# Patient Record
Sex: Female | Born: 1985 | Race: Black or African American | Hispanic: No | Marital: Single | Smoking: Current every day smoker
Health system: Southern US, Community
[De-identification: ages and names within clinical notes are randomized; demographics above are authoritative.]

## PROBLEM LIST (undated history)

## (undated) ENCOUNTER — Inpatient Hospital Stay (HOSPITAL_COMMUNITY): Payer: Self-pay

## (undated) DIAGNOSIS — IMO0002 Reserved for concepts with insufficient information to code with codable children: Secondary | ICD-10-CM

## (undated) DIAGNOSIS — R51 Headache: Secondary | ICD-10-CM

## (undated) DIAGNOSIS — R519 Headache, unspecified: Secondary | ICD-10-CM

## (undated) DIAGNOSIS — G4730 Sleep apnea, unspecified: Secondary | ICD-10-CM

## (undated) DIAGNOSIS — G5600 Carpal tunnel syndrome, unspecified upper limb: Secondary | ICD-10-CM

## (undated) DIAGNOSIS — N970 Female infertility associated with anovulation: Secondary | ICD-10-CM

## (undated) DIAGNOSIS — E119 Type 2 diabetes mellitus without complications: Secondary | ICD-10-CM

## (undated) DIAGNOSIS — O1490 Unspecified pre-eclampsia, unspecified trimester: Secondary | ICD-10-CM

## (undated) HISTORY — DX: Carpal tunnel syndrome, unspecified upper limb: G56.00

## (undated) HISTORY — DX: Unspecified pre-eclampsia, unspecified trimester: O14.90

## (undated) HISTORY — PX: HERNIA REPAIR: SHX51

## (undated) HISTORY — DX: Female infertility associated with anovulation: N97.0

## (undated) HISTORY — DX: Reserved for concepts with insufficient information to code with codable children: IMO0002

---
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
## 2007-02-28 ENCOUNTER — Encounter: Admission: RE | Admit: 2007-02-28 | Discharge: 2007-02-28 | Payer: Self-pay | Admitting: Internal Medicine

## 2007-03-24 HISTORY — PX: BREAST SURGERY: SHX581

## 2007-04-07 ENCOUNTER — Encounter: Admission: RE | Admit: 2007-04-07 | Discharge: 2007-04-07 | Payer: Self-pay | Admitting: Internal Medicine

## 2007-04-15 ENCOUNTER — Encounter (INDEPENDENT_AMBULATORY_CARE_PROVIDER_SITE_OTHER): Payer: Self-pay | Admitting: Diagnostic Radiology

## 2007-04-15 ENCOUNTER — Encounter: Admission: RE | Admit: 2007-04-15 | Discharge: 2007-04-15 | Payer: Self-pay | Admitting: Internal Medicine

## 2007-04-22 ENCOUNTER — Encounter (INDEPENDENT_AMBULATORY_CARE_PROVIDER_SITE_OTHER): Payer: Self-pay | Admitting: Diagnostic Radiology

## 2007-04-22 ENCOUNTER — Encounter: Admission: RE | Admit: 2007-04-22 | Discharge: 2007-04-22 | Payer: Self-pay | Admitting: Internal Medicine

## 2007-04-24 HISTORY — PX: BREAST BIOPSY: SHX20

## 2008-06-21 ENCOUNTER — Emergency Department (HOSPITAL_COMMUNITY): Admission: EM | Admit: 2008-06-21 | Discharge: 2008-06-21 | Payer: Self-pay | Admitting: Emergency Medicine

## 2008-10-02 ENCOUNTER — Emergency Department (HOSPITAL_COMMUNITY): Admission: EM | Admit: 2008-10-02 | Discharge: 2008-10-02 | Payer: Self-pay | Admitting: Emergency Medicine

## 2009-04-14 ENCOUNTER — Emergency Department (HOSPITAL_COMMUNITY): Admission: EM | Admit: 2009-04-14 | Discharge: 2009-04-14 | Payer: Self-pay | Admitting: Emergency Medicine

## 2009-08-06 ENCOUNTER — Emergency Department (HOSPITAL_COMMUNITY): Admission: EM | Admit: 2009-08-06 | Discharge: 2009-08-06 | Payer: Self-pay | Admitting: Emergency Medicine

## 2009-12-17 ENCOUNTER — Emergency Department (HOSPITAL_COMMUNITY): Admission: EM | Admit: 2009-12-17 | Discharge: 2009-12-17 | Payer: Self-pay | Admitting: Emergency Medicine

## 2010-04-11 ENCOUNTER — Ambulatory Visit: Admit: 2010-04-11 | Payer: Self-pay | Admitting: Gynecology

## 2010-04-12 ENCOUNTER — Encounter: Payer: Self-pay | Admitting: Internal Medicine

## 2010-04-13 ENCOUNTER — Encounter: Payer: Self-pay | Admitting: Internal Medicine

## 2010-06-05 LAB — BASIC METABOLIC PANEL
BUN: 7 mg/dL (ref 6–23)
CO2: 23 mEq/L (ref 19–32)
Chloride: 107 mEq/L (ref 96–112)
Creatinine, Ser: 0.95 mg/dL (ref 0.4–1.2)
Glucose, Bld: 94 mg/dL (ref 70–99)
Potassium: 3.3 mEq/L — ABNORMAL LOW (ref 3.5–5.1)

## 2010-06-05 LAB — CBC
HCT: 38.1 % (ref 36.0–46.0)
MCH: 27.6 pg (ref 26.0–34.0)
MCHC: 34.4 g/dL (ref 30.0–36.0)
MCV: 80.4 fL (ref 78.0–100.0)
RDW: 12.2 % (ref 11.5–15.5)

## 2010-06-08 LAB — COMPREHENSIVE METABOLIC PANEL
ALT: 46 U/L — ABNORMAL HIGH (ref 0–35)
Alkaline Phosphatase: 43 U/L (ref 39–117)
BUN: 9 mg/dL (ref 6–23)
CO2: 25 mEq/L (ref 19–32)
Calcium: 9.5 mg/dL (ref 8.4–10.5)
GFR calc non Af Amer: >60 mL/min (ref >60)
Glucose, Bld: 94 mg/dL (ref 70–99)
Potassium: 3.9 mEq/L (ref 3.5–5.1)
Sodium: 136 mEq/L (ref 135–145)

## 2010-06-08 LAB — CBC
HCT: 39.8 % (ref 36.0–46.0)
Hemoglobin: 13.6 g/dL (ref 12.0–15.0)
MCHC: 34.3 g/dL (ref 30.0–36.0)
RBC: 4.75 MIL/uL (ref 3.87–5.11)

## 2010-06-08 LAB — URINALYSIS, ROUTINE W REFLEX MICROSCOPIC
Bilirubin Urine: NEGATIVE
Glucose, UA: NEGATIVE mg/dL
Hgb urine dipstick: NEGATIVE
Nitrite: NEGATIVE
Specific Gravity, Urine: 1.023 (ref 1.005–1.030)
pH: 7.5 (ref 5.0–8.0)

## 2010-06-08 LAB — DIFFERENTIAL
Basophils Relative: 0 % (ref 0–1)
Eosinophils Absolute: 0.1 10*3/uL (ref 0.0–0.7)
Neutrophils Relative %: 79 % — ABNORMAL HIGH (ref 43–77)

## 2010-06-08 LAB — POCT I-STAT, CHEM 8
HCT: 42 % (ref 36.0–46.0)
Hemoglobin: 14.3 g/dL (ref 12.0–15.0)
Sodium: 138 mEq/L (ref 135–145)
TCO2: 27 mmol/L (ref 0–100)

## 2010-06-08 LAB — LIPASE, BLOOD: Lipase: 30 U/L (ref 11–59)

## 2010-06-08 LAB — POCT PREGNANCY, URINE: Preg Test, Ur: NEGATIVE

## 2010-06-09 LAB — RAPID STREP SCREEN (MED CTR MEBANE ONLY): Streptococcus, Group A Screen (Direct): NEGATIVE

## 2010-07-02 LAB — WET PREP, GENITAL

## 2010-07-02 LAB — URINALYSIS, ROUTINE W REFLEX MICROSCOPIC
Bilirubin Urine: NEGATIVE
Ketones, ur: NEGATIVE mg/dL
Nitrite: NEGATIVE
Urobilinogen, UA: 1 mg/dL (ref 0.0–1.0)

## 2010-07-02 LAB — GC/CHLAMYDIA PROBE AMP, GENITAL: Chlamydia, DNA Probe: POSITIVE — AB

## 2010-07-02 LAB — POCT PREGNANCY, URINE: Preg Test, Ur: NEGATIVE

## 2010-08-19 ENCOUNTER — Ambulatory Visit (INDEPENDENT_AMBULATORY_CARE_PROVIDER_SITE_OTHER): Payer: Self-pay | Admitting: Gynecology

## 2010-08-19 DIAGNOSIS — N926 Irregular menstruation, unspecified: Secondary | ICD-10-CM

## 2010-08-19 DIAGNOSIS — L680 Hirsutism: Secondary | ICD-10-CM

## 2010-08-21 ENCOUNTER — Other Ambulatory Visit: Payer: Self-pay | Admitting: Gynecology

## 2010-08-21 DIAGNOSIS — Z1231 Encounter for screening mammogram for malignant neoplasm of breast: Secondary | ICD-10-CM

## 2010-08-25 ENCOUNTER — Other Ambulatory Visit: Payer: Managed Care, Other (non HMO)

## 2010-08-25 ENCOUNTER — Ambulatory Visit (INDEPENDENT_AMBULATORY_CARE_PROVIDER_SITE_OTHER): Payer: Managed Care, Other (non HMO) | Admitting: Gynecology

## 2010-08-25 ENCOUNTER — Other Ambulatory Visit: Payer: Self-pay | Admitting: Gynecology

## 2010-08-25 DIAGNOSIS — E282 Polycystic ovarian syndrome: Secondary | ICD-10-CM

## 2010-08-25 DIAGNOSIS — N912 Amenorrhea, unspecified: Secondary | ICD-10-CM

## 2010-08-25 DIAGNOSIS — N926 Irregular menstruation, unspecified: Secondary | ICD-10-CM

## 2010-08-27 ENCOUNTER — Ambulatory Visit
Admission: RE | Admit: 2010-08-27 | Discharge: 2010-08-27 | Disposition: A | Discharge status: Home / Self Care | Payer: Managed Care, Other (non HMO) | Source: Ambulatory Visit | Attending: Gynecology | Admitting: Gynecology

## 2010-08-27 DIAGNOSIS — Z1231 Encounter for screening mammogram for malignant neoplasm of breast: Secondary | ICD-10-CM

## 2010-08-29 ENCOUNTER — Other Ambulatory Visit: Payer: Self-pay | Admitting: Gynecology

## 2010-08-29 DIAGNOSIS — R928 Other abnormal and inconclusive findings on diagnostic imaging of breast: Secondary | ICD-10-CM

## 2010-09-09 ENCOUNTER — Ambulatory Visit
Admission: RE | Admit: 2010-09-09 | Discharge: 2010-09-09 | Disposition: A | Discharge status: Home / Self Care | Payer: Managed Care, Other (non HMO) | Source: Ambulatory Visit | Attending: Gynecology | Admitting: Gynecology

## 2010-09-09 DIAGNOSIS — R928 Other abnormal and inconclusive findings on diagnostic imaging of breast: Secondary | ICD-10-CM

## 2010-09-17 ENCOUNTER — Emergency Department (HOSPITAL_COMMUNITY): Payer: Managed Care, Other (non HMO)

## 2010-09-17 ENCOUNTER — Emergency Department (HOSPITAL_COMMUNITY)
Admission: EM | Admit: 2010-09-17 | Discharge: 2010-09-17 | Disposition: A | Discharge status: Home / Self Care | Payer: Managed Care, Other (non HMO) | Attending: Emergency Medicine | Admitting: Emergency Medicine

## 2010-09-17 DIAGNOSIS — R197 Diarrhea, unspecified: Secondary | ICD-10-CM | POA: Insufficient documentation

## 2010-09-17 DIAGNOSIS — N949 Unspecified condition associated with female genital organs and menstrual cycle: Secondary | ICD-10-CM | POA: Insufficient documentation

## 2010-09-17 DIAGNOSIS — B9689 Other specified bacterial agents as the cause of diseases classified elsewhere: Secondary | ICD-10-CM | POA: Insufficient documentation

## 2010-09-17 DIAGNOSIS — N760 Acute vaginitis: Secondary | ICD-10-CM | POA: Insufficient documentation

## 2010-09-17 DIAGNOSIS — A499 Bacterial infection, unspecified: Secondary | ICD-10-CM | POA: Insufficient documentation

## 2010-09-17 LAB — URINALYSIS, ROUTINE W REFLEX MICROSCOPIC
Bilirubin Urine: NEGATIVE
Nitrite: NEGATIVE
Specific Gravity, Urine: 1.029 (ref 1.005–1.030)
Urobilinogen, UA: 0.2 mg/dL (ref 0.0–1.0)
pH: 5 (ref 5.0–8.0)

## 2010-09-17 LAB — WET PREP, GENITAL: WBC, Wet Prep HPF POC: NONE SEEN

## 2010-09-17 LAB — POCT PREGNANCY, URINE: Preg Test, Ur: NEGATIVE

## 2010-09-18 LAB — GC/CHLAMYDIA PROBE AMP, GENITAL
Chlamydia, DNA Probe: NEGATIVE
GC Probe Amp, Genital: NEGATIVE

## 2011-03-06 ENCOUNTER — Other Ambulatory Visit: Payer: Managed Care, Other (non HMO)

## 2011-03-24 DIAGNOSIS — G5600 Carpal tunnel syndrome, unspecified upper limb: Secondary | ICD-10-CM

## 2011-03-24 HISTORY — DX: Carpal tunnel syndrome, unspecified upper limb: G56.00

## 2011-04-14 ENCOUNTER — Encounter: Payer: Self-pay | Admitting: Gynecology

## 2011-04-14 ENCOUNTER — Ambulatory Visit (INDEPENDENT_AMBULATORY_CARE_PROVIDER_SITE_OTHER): Payer: Self-pay | Admitting: Gynecology

## 2011-04-14 DIAGNOSIS — N920 Excessive and frequent menstruation with regular cycle: Secondary | ICD-10-CM

## 2011-04-14 DIAGNOSIS — N946 Dysmenorrhea, unspecified: Secondary | ICD-10-CM

## 2011-04-14 DIAGNOSIS — N949 Unspecified condition associated with female genital organs and menstrual cycle: Secondary | ICD-10-CM

## 2011-04-14 DIAGNOSIS — N938 Other specified abnormal uterine and vaginal bleeding: Secondary | ICD-10-CM

## 2011-04-14 MED ORDER — IBUPROFEN 800 MG PO TABS: 800.0000 mg | ORAL_TABLET | Freq: Three times a day (TID) | ORAL | Status: AC | PRN

## 2011-04-14 NOTE — Progress Notes (Signed)
Patient presents complaining of heavy bleeding. She was seen earlier this past year with a history of irregular menses and an oligo-ovulatory pattern.  She had a normal prolactin, TSH, negative hCG glucose of 76 and a lipid profile showed a borderline lipid profile. 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 She was started on oral contraceptives and took one pack of Loestrin 120 equivalent stop them and then has had regular menses on a monthly basis up until her normal last menstrual period the end of December. She then began bleeding January 3 and has bled on and off since then. She reports her last intercourse was prior to her last normal period in December. She is having a lot of cramping with this bleeding. No fevers chills nausea vomiting constipation diarrhea or urinary symptoms.  Exam with Sherri chaperone present Abdomen soft mild lower, will tenderness no rebound guarding masses organomegaly Pelvic external BUS vagina with light menses flow. Cervix normal. Uterus normal size midline mobile nontender. Adnexa without masses or tenderness.  Assessment and plan: DUB history of PCO type pattern although has been having regular menses over the past 6 months. Also complaining of wanting to achieve pregnancy without success. Will check baseline CBC and hCG. Assuming hCG negative then will withdraw on progesterone. She'll keep menstrual calendar if regular menses then will pursue an infertility evaluation to begin with semen analysis and HSG noting normal hormone studies earlier this year. If irregular menses I discussed possible Clomid treatment. I reviewed the risks of ovulatory stimulation to include multiple gestation, hyperstimulation syndrome and possible ovarian cancer linkage.  She will call tomorrow once we get the hCG results back and then we'll withdraw on progesterone.

## 2011-04-14 NOTE — Patient Instructions (Signed)
Follow up tomorrow for blood results.

## 2011-04-15 ENCOUNTER — Ambulatory Visit: Payer: Managed Care, Other (non HMO) | Admitting: Gynecology

## 2011-04-20 ENCOUNTER — Other Ambulatory Visit: Payer: Self-pay | Admitting: Gynecology

## 2011-04-20 ENCOUNTER — Telehealth: Payer: Self-pay | Admitting: *Deleted

## 2011-04-20 NOTE — Telephone Encounter (Signed)
Patient informed labs normal

## 2011-06-22 ENCOUNTER — Ambulatory Visit: Payer: Self-pay | Admitting: Gynecology

## 2012-01-31 ENCOUNTER — Emergency Department (HOSPITAL_COMMUNITY): Payer: Self-pay

## 2012-01-31 ENCOUNTER — Encounter (HOSPITAL_COMMUNITY): Payer: Self-pay | Admitting: Emergency Medicine

## 2012-01-31 ENCOUNTER — Emergency Department (HOSPITAL_COMMUNITY)
Admission: EM | Admit: 2012-01-31 | Discharge: 2012-02-01 | Disposition: A | Discharge status: Home / Self Care | Payer: Self-pay | Attending: Emergency Medicine | Admitting: Emergency Medicine

## 2012-01-31 DIAGNOSIS — R0789 Other chest pain: Secondary | ICD-10-CM | POA: Insufficient documentation

## 2012-01-31 DIAGNOSIS — J029 Acute pharyngitis, unspecified: Secondary | ICD-10-CM | POA: Insufficient documentation

## 2012-01-31 DIAGNOSIS — G43909 Migraine, unspecified, not intractable, without status migrainosus: Secondary | ICD-10-CM | POA: Insufficient documentation

## 2012-01-31 DIAGNOSIS — G5600 Carpal tunnel syndrome, unspecified upper limb: Secondary | ICD-10-CM | POA: Insufficient documentation

## 2012-01-31 DIAGNOSIS — R059 Cough, unspecified: Secondary | ICD-10-CM | POA: Insufficient documentation

## 2012-01-31 DIAGNOSIS — R05 Cough: Secondary | ICD-10-CM | POA: Insufficient documentation

## 2012-01-31 DIAGNOSIS — R0982 Postnasal drip: Secondary | ICD-10-CM | POA: Insufficient documentation

## 2012-01-31 DIAGNOSIS — B9789 Other viral agents as the cause of diseases classified elsewhere: Secondary | ICD-10-CM | POA: Insufficient documentation

## 2012-01-31 DIAGNOSIS — J069 Acute upper respiratory infection, unspecified: Secondary | ICD-10-CM | POA: Insufficient documentation

## 2012-01-31 DIAGNOSIS — J3489 Other specified disorders of nose and nasal sinuses: Secondary | ICD-10-CM | POA: Insufficient documentation

## 2012-01-31 DIAGNOSIS — R0602 Shortness of breath: Secondary | ICD-10-CM | POA: Insufficient documentation

## 2012-01-31 NOTE — ED Notes (Addendum)
Patient complaining of sudden onset of shortness of breath, chest pain, and dizziness that started today.  Reports chest pain mid-sternal; denies radiation of pain.  Patient short of breath during assessment; needs to pause in-between words.  Denies nausea and vomiting.  Reports cough and nasal that started two days ago.  Patient tearful in triage.

## 2012-02-01 ENCOUNTER — Emergency Department (HOSPITAL_COMMUNITY): Payer: Self-pay

## 2012-02-01 LAB — CBC WITH DIFFERENTIAL/PLATELET
Basophils Absolute: 0 10*3/uL (ref 0.0–0.1)
Basophils Relative: 0 % (ref 0–1)
HCT: 39.6 % (ref 36.0–46.0)
Lymphocytes Relative: 19 % (ref 12–46)
MCHC: 34.8 g/dL (ref 30.0–36.0)
Neutro Abs: 8.8 10*3/uL — ABNORMAL HIGH (ref 1.7–7.7)
Neutrophils Relative %: 71 % (ref 43–77)
Platelets: 315 10*3/uL (ref 150–400)
RDW: 12.3 % (ref 11.5–15.5)
WBC: 12.3 10*3/uL — ABNORMAL HIGH (ref 4.0–10.5)

## 2012-02-01 LAB — URINALYSIS, ROUTINE W REFLEX MICROSCOPIC
Bilirubin Urine: NEGATIVE
Glucose, UA: NEGATIVE mg/dL
Hgb urine dipstick: NEGATIVE
Protein, ur: NEGATIVE mg/dL

## 2012-02-01 LAB — POCT I-STAT TROPONIN I: Troponin i, poc: 0 ng/mL (ref 0.00–0.08)

## 2012-02-01 LAB — COMPREHENSIVE METABOLIC PANEL
ALT: 32 U/L (ref 0–35)
AST: 24 U/L (ref 0–37)
Albumin: 3.7 g/dL (ref 3.5–5.2)
CO2: 24 mEq/L (ref 19–32)
Chloride: 100 mEq/L (ref 96–112)
Creatinine, Ser: 0.74 mg/dL (ref 0.50–1.10)
GFR calc non Af Amer: >90 mL/min (ref >90)
Potassium: 3.7 mEq/L (ref 3.5–5.1)
Sodium: 134 mEq/L — ABNORMAL LOW (ref 135–145)
Total Bilirubin: 0.4 mg/dL (ref 0.3–1.2)

## 2012-02-01 MED ORDER — DIPHENHYDRAMINE HCL 25 MG PO CAPS
25.0000 mg | ORAL_CAPSULE | Freq: Once | ORAL | Status: AC
  Administered 2012-02-01: 25 mg via ORAL
  Filled 2012-02-01: qty 1

## 2012-02-01 MED ORDER — OXYCODONE-ACETAMINOPHEN 5-325 MG PO TABS
1.0000 | ORAL_TABLET | Freq: Once | ORAL | Status: AC
  Administered 2012-02-01: 1 via ORAL
  Filled 2012-02-01: qty 1

## 2012-02-01 MED ORDER — IOHEXOL 350 MG/ML SOLN
100.0000 mL | Freq: Once | INTRAVENOUS | Status: AC | PRN
  Administered 2012-02-01: 100 mL via INTRAVENOUS

## 2012-02-01 MED ORDER — IBUPROFEN 800 MG PO TABS
800.0000 mg | ORAL_TABLET | Freq: Once | ORAL | Status: AC
  Administered 2012-02-01: 800 mg via ORAL
  Filled 2012-02-01: qty 1

## 2012-02-01 MED ORDER — SODIUM CHLORIDE 0.9 % IV BOLUS (SEPSIS)
1000.0000 mL | Freq: Once | INTRAVENOUS | Status: AC
  Administered 2012-02-01: 1000 mL via INTRAVENOUS

## 2012-02-01 NOTE — ED Provider Notes (Signed)
Medical screening examination/treatment/procedure(s) were conducted as a shared visit with non-physician practitioner(s) and myself.  I personally evaluated the patient during the encounter.  Pt with sob, cp, tachycardia.  CTA chest negative for PE, suspect viral infection  Olivia Mackie, MD 02/01/12 1751

## 2012-02-01 NOTE — ED Notes (Signed)
Pt. Reports sore throat x2 days. States woke up this evening with chest pain and HA and dizziness.

## 2012-02-01 NOTE — ED Provider Notes (Signed)
History     CSN: [id number]  Arrival date & time 01/31/12  2109   First MD Initiated Contact with Patient 01/31/12 2359      Chief Complaint  Patient presents with  . Chest Pain  . Dizziness    (Consider location/radiation/quality/duration/timing/severity/associated sxs/prior treatment) Patient is a 26 y.o. female presenting with chest pain. The history is provided by the patient. No language interpreter was used.  Chest Pain The chest pain began yesterday. Chest pain occurs constantly. The chest pain is unchanged. The pain is associated with breathing and coughing. At its most intense, the pain is at 8/10. The severity of the pain is moderate. The quality of the pain is described as pressure-like and heavy. The pain does not radiate. Chest pain is worsened by deep breathing. Primary symptoms include shortness of breath and cough. Pertinent negatives for primary symptoms include no fever, no wheezing, no palpitations, no nausea and no vomiting.   Midsternal nonradiating chest Pain that is worse when she coughs since yesterday when she woke with pain around 7pm.  States that the pain is constant 7/10 and worse with a deep breath. + SOB and dizziness.   Also c/o a bilateral headache that started at the same time.  Feels like a migraine ha that I used to get a long time a go.   Took childrens motrin for pain with no relief.  Headache is 10/10 with photophobia.   C/o Runny nose nasal congestion, , sneezing, scratchy throat.  Denies n/v, ear pain. No birth control pills but tachycardia today.  pmh listed below.  Smoker. Plan to check labs, chest x-ray and CT angio to r/o pe.  Past Medical History  Diagnosis Date  . Carpal tunnel syndrome 03-2011    BILATERAL RECEIVES INJECTIONS    Past Surgical History  Procedure Date  . Breast surgery 2009    left breast bx-benign    Family History  Problem Relation Age of Onset  . Breast cancer Mother     2005 AND 2013  . Diabetes Father   .  Diabetes Maternal Grandfather   . Breast cancer Paternal Grandmother   . Breast cancer Paternal Grandfather     History  Substance Use Topics  . Smoking status: Current Every Day Smoker    Types: Cigarettes  . Smokeless tobacco: Former Neurosurgeon  . Alcohol Use: No    OB History    Grav Para Term Preterm Abortions TAB SAB Ect Mult Living   2    2  2          Review of Systems  Constitutional: Negative.  Negative for fever.  HENT: Positive for congestion, sore throat, rhinorrhea, sneezing, postnasal drip and sinus pressure. Negative for ear pain and trouble swallowing.   Eyes: Negative.   Respiratory: Positive for cough and shortness of breath. Negative for wheezing.   Cardiovascular: Positive for chest pain. Negative for palpitations.  Gastrointestinal: Negative.  Negative for nausea and vomiting.  Neurological: Negative.   Psychiatric/Behavioral: Negative.   All other systems reviewed and are negative.    Allergies  Review of patient's allergies indicates no known allergies.  Home Medications  No current outpatient prescriptions on file.  BP 149/93  Pulse 113  Temp 98.4 F (36.9 C) (Oral)  Resp 18  SpO2 98%  Physical Exam  Nursing note and vitals reviewed. Constitutional: She is oriented to person, place, and time. She appears well-developed and well-nourished.  HENT:  Head: Normocephalic and atraumatic.  Right Ear: Tympanic  membrane normal.  Left Ear: Tympanic membrane normal.  Nose: Mucosal edema and rhinorrhea present.  Mouth/Throat: Uvula is midline and mucous membranes are normal. Posterior oropharyngeal erythema present. No oropharyngeal exudate or posterior oropharyngeal edema.  Eyes: Conjunctivae normal and EOM are normal. Pupils are equal, round, and reactive to light.  Neck: Normal range of motion. Neck supple.  Cardiovascular: Normal rate.   Pulmonary/Chest: Effort normal. No respiratory distress. She has no wheezes. She exhibits no tenderness.    Abdominal: Soft. She exhibits no distension. There is no tenderness.  Musculoskeletal: Normal range of motion. She exhibits no edema and no tenderness.  Neurological: She is alert and oriented to person, place, and time. She has normal strength and normal reflexes. No cranial nerve deficit or sensory deficit. GCS eye subscore is 4. GCS verbal subscore is 5. GCS motor subscore is 6.       PEARL no drift or droop.  Neuro in tact.  MAE =  coordination and DTR  Skin: Skin is warm and dry.  Psychiatric: She has a normal mood and affect.    ED Course  Procedures (including critical care time)  Labs Reviewed  CBC WITH DIFFERENTIAL - Abnormal; Notable for the following:    WBC 12.3 (*)     Neutro Abs 8.8 (*)     All other components within normal limits  COMPREHENSIVE METABOLIC PANEL   Dg Chest 2 View  01/31/2012  *RADIOLOGY REPORT*  Clinical Data: Chest pain and dizziness  CHEST - 2 VIEW  Comparison: 08/06/2009  Findings: The heart size and mediastinal contours are within normal limits.  Both lungs are clear.  The visualized skeletal structures are unremarkable.  IMPRESSION: Negative exam.   Original Report Authenticated By: Signa Kell, M.D.      No diagnosis found.    MDM  26 yo female with midsternal cp, sob tachycardia and migraine h/a. Chest x-ray and labs unremarkable. CT of the chest reviewed by Dr Norlene Campbell shows no PE. Upper respiratory infection as well.   IVF bolus ibuprofen/percocet and benadryl in the ER. Will follow up with pcp of choice or return to ER for severe pain, n/v or high fever.   Labs Reviewed  CBC WITH DIFFERENTIAL - Abnormal; Notable for the following:    WBC 12.3 (*)     Neutro Abs 8.8 (*)     All other components within normal limits  COMPREHENSIVE METABOLIC PANEL - Abnormal; Notable for the following:    Sodium 134 (*)     Glucose, Bld 137 (*)     All other components within normal limits  POCT I-STAT TROPONIN I  URINALYSIS, ROUTINE W REFLEX MICROSCOPIC      Date: 02/01/2012  Rate: 89  Rhythm: normal sinus rhythm  QRS Axis: normal  Intervals: normal  ST/T Wave abnormalities: normal  Conduction Disutrbances:none  Narrative Interpretation:   Old EKG Reviewed: none available          Remi Haggard, NP 02/01/12 1239

## 2012-04-14 ENCOUNTER — Emergency Department (HOSPITAL_COMMUNITY)
Admission: EM | Admit: 2012-04-14 | Discharge: 2012-04-14 | Disposition: A | Discharge status: Home / Self Care | Payer: No Typology Code available for payment source | Attending: Emergency Medicine | Admitting: Emergency Medicine

## 2012-04-14 ENCOUNTER — Encounter (HOSPITAL_COMMUNITY): Payer: Self-pay | Admitting: Emergency Medicine

## 2012-04-14 ENCOUNTER — Emergency Department (HOSPITAL_COMMUNITY): Payer: No Typology Code available for payment source

## 2012-04-14 DIAGNOSIS — F17200 Nicotine dependence, unspecified, uncomplicated: Secondary | ICD-10-CM | POA: Insufficient documentation

## 2012-04-14 DIAGNOSIS — S161XXA Strain of muscle, fascia and tendon at neck level, initial encounter: Secondary | ICD-10-CM

## 2012-04-14 DIAGNOSIS — S139XXA Sprain of joints and ligaments of unspecified parts of neck, initial encounter: Secondary | ICD-10-CM | POA: Insufficient documentation

## 2012-04-14 DIAGNOSIS — Y92410 Unspecified street and highway as the place of occurrence of the external cause: Secondary | ICD-10-CM | POA: Insufficient documentation

## 2012-04-14 DIAGNOSIS — Y9389 Activity, other specified: Secondary | ICD-10-CM | POA: Insufficient documentation

## 2012-04-14 DIAGNOSIS — IMO0002 Reserved for concepts with insufficient information to code with codable children: Secondary | ICD-10-CM | POA: Insufficient documentation

## 2012-04-14 DIAGNOSIS — Z8739 Personal history of other diseases of the musculoskeletal system and connective tissue: Secondary | ICD-10-CM | POA: Insufficient documentation

## 2012-04-14 MED ORDER — CYCLOBENZAPRINE HCL 5 MG PO TABS: 5.0000 mg | ORAL_TABLET | Freq: Three times a day (TID) | ORAL | Status: DC | PRN

## 2012-04-14 MED ORDER — HYDROCODONE-ACETAMINOPHEN 5-325 MG PO TABS: ORAL_TABLET | ORAL | Status: DC

## 2012-04-14 MED ORDER — HYDROCODONE-ACETAMINOPHEN 5-325 MG PO TABS
2.0000 | ORAL_TABLET | Freq: Once | ORAL | Status: AC
  Administered 2012-04-14: 2 via ORAL
  Filled 2012-04-14: qty 2

## 2012-04-14 NOTE — ED Notes (Signed)
Patient transported to X-ray 

## 2012-04-14 NOTE — Discharge Instructions (Signed)
 Cervical Sprain A cervical sprain is an injury in the neck in which the ligaments are stretched or torn. The ligaments are the tissues that hold the bones of the neck (vertebrae) in place.Cervical sprains can range from very mild to very severe. Most cervical sprains get better in 1 to 3 weeks, but it depends on the cause and extent of the injury. Severe cervical sprains can cause the neck vertebrae to be unstable. This can lead to damage of the spinal cord and can result in serious nervous system problems. Your caregiver will determine whether your cervical sprain is mild or severe. CAUSES  Severe cervical sprains may be caused by:  Contact sport injuries (football, rugby, wrestling, hockey, auto racing, gymnastics, diving, martial arts, boxing).  Motor vehicle collisions.  Whiplash injuries. This means the neck is forcefully whipped backward and forward.  Falls. Mild cervical sprains may be caused by:   Awkward positions, such as cradling a telephone between your ear and shoulder.  Sitting in a chair that does not offer proper support.  Working at a poorly Marketing executive station.  Activities that require looking up or down for long periods of time. SYMPTOMS   Pain, soreness, stiffness, or a burning sensation in the front, back, or sides of the neck. This discomfort may develop immediately after injury or it may develop slowly and not begin for 24 hours or more after an injury.  Pain or tenderness directly in the middle of the back of the neck.  Shoulder or upper back pain.  Limited ability to move the neck.  Headache.  Dizziness.  Weakness, numbness, or tingling in the hands or arms.  Muscle spasms.  Difficulty swallowing or chewing.  Tenderness and swelling of the neck. DIAGNOSIS  Most of the time, your caregiver can diagnose this problem by taking your history and doing a physical exam. Your caregiver will ask about any known problems, such as arthritis in the neck  or a previous neck injury. X-rays may be taken to find out if there are any other problems, such as problems with the bones of the neck. However, an X-ray often does not reveal the full extent of a cervical sprain. Other tests such as a computed tomography (CT) scan or magnetic resonance imaging (MRI) may be needed. TREATMENT  Treatment depends on the severity of the cervical sprain. Mild sprains can be treated with rest, keeping the neck in place (immobilization), and pain medicines. Severe cervical sprains need immediate immobilization and an appointment with an orthopedist or neurosurgeon. Several treatment options are available to help with pain, muscle spasms, and other symptoms. Your caregiver may prescribe:  Medicines, such as pain relievers, numbing medicines, or muscle relaxants.  Physical therapy. This can include stretching exercises, strengthening exercises, and posture training. Exercises and improved posture can help stabilize the neck, strengthen muscles, and help stop symptoms from returning.  A neck collar to be worn for short periods of time. Often, these collars are worn for comfort. However, certain collars may be worn to protect the neck and prevent further worsening of a serious cervical sprain. HOME CARE INSTRUCTIONS   Put ice on the injured area.  Put ice in a plastic bag.  Place a towel between your skin and the bag.  Leave the ice on for 15 to 20 minutes, 3 to 4 times a day.  Only take over-the-counter or prescription medicines for pain, discomfort, or fever as directed by your caregiver.  Keep all follow-up appointments as directed by your  caregiver.  Keep all physical therapy appointments as directed by your caregiver.  If a neck collar is prescribed, wear it as directed by your caregiver.  Do not drive while wearing a neck collar.  Make any needed adjustments to your work station to promote good posture.  Avoid positions and activities that make your  symptoms worse.  Warm up and stretch before being active to help prevent problems. SEEK MEDICAL CARE IF:   Your pain is not controlled with medicine.  You are unable to decrease your pain medicine over time as planned.  Your activity level is not improving as expected. SEEK IMMEDIATE MEDICAL CARE IF:   You develop any bleeding, stomach upset, or signs of an allergic reaction to your medicine.  Your symptoms get worse.  You develop new, unexplained symptoms.  You have numbness, tingling, weakness, or paralysis in any part of your body. MAKE SURE YOU:   Understand these instructions.  Will watch your condition.  Will get help right away if you are not doing well or get worse. Document Released: 01/04/2007 Document Revised: 06/01/2011 Document Reviewed: 12/10/2010 [facility] Patient Information 2013 [place], [place].    Narcotic and benzodiazepine use may cause drowsiness, slowed breathing or dependence.  Please use with caution and do not drive, operate machinery or watch young children alone while taking them.  Taking combinations of these medications or drinking alcohol will potentiate these effects.

## 2012-04-14 NOTE — ED Notes (Signed)
Pt ambulated to bathroom without any assistance.  Pt complains of same pain between her shoulder blades but no new symptoms appeared during ambulation
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
## 2012-08-28 ENCOUNTER — Encounter (HOSPITAL_COMMUNITY): Payer: Self-pay | Admitting: *Deleted

## 2012-08-28 ENCOUNTER — Emergency Department (HOSPITAL_COMMUNITY)
Admission: EM | Admit: 2012-08-28 | Discharge: 2012-08-28 | Disposition: A | Discharge status: Home / Self Care | Payer: Self-pay | Attending: Emergency Medicine | Admitting: Emergency Medicine

## 2012-08-28 DIAGNOSIS — F17200 Nicotine dependence, unspecified, uncomplicated: Secondary | ICD-10-CM | POA: Insufficient documentation

## 2012-08-28 DIAGNOSIS — H00013 Hordeolum externum right eye, unspecified eyelid: Secondary | ICD-10-CM

## 2012-08-28 DIAGNOSIS — H00019 Hordeolum externum unspecified eye, unspecified eyelid: Secondary | ICD-10-CM | POA: Insufficient documentation

## 2012-08-28 MED ORDER — ACETAMINOPHEN-CODEINE #3 300-30 MG PO TABS: 1.0000 | ORAL_TABLET | Freq: Four times a day (QID) | ORAL | Status: DC | PRN

## 2012-08-28 MED ORDER — AMOXICILLIN-POT CLAVULANATE 875-125 MG PO TABS: 1.0000 | ORAL_TABLET | Freq: Two times a day (BID) | ORAL | Status: DC

## 2012-08-28 MED ORDER — ERYTHROMYCIN 5 MG/GM OP OINT: TOPICAL_OINTMENT | OPHTHALMIC | Status: DC

## 2012-08-28 NOTE — ED Provider Notes (Signed)
History     CSN: [id number]  Arrival date & time 08/28/12  4098   First MD Initiated Contact with Patient 08/28/12 [phone]      Chief Complaint  Patient presents with  . Eye Problem    (Consider location/radiation/quality/duration/timing/severity/associated sxs/prior treatment) HPI Patient presents to the emergency with a sty on her right lower eyelid that has been there for the past 3 days.  Patient, states, that she's gotten status in the past and she had one on her left lower eyelid recently and use warm compresses to resolve the area.  Patient, states she's not had any blurred vision, nausea, vomiting, headache, weakness, numbness, dizziness, or syncope.  Patient, states, that she's not having a rash on her skin of her face.  Patient, states, that she did not take any other medications for her symptoms. Past Medical History  Diagnosis Date  . Carpal tunnel syndrome 03-2011    BILATERAL RECEIVES INJECTIONS    Past Surgical History  Procedure Laterality Date  . Breast surgery  2009    left breast bx-benign    Family History  Problem Relation Age of Onset  . Breast cancer Mother     2005 AND 2013  . Diabetes Father   . Diabetes Maternal Grandfather   . Breast cancer Paternal Grandmother   . Breast cancer Paternal Grandfather     History  Substance Use Topics  . Smoking status: Current Every Day Smoker -- 1.00 packs/day    Types: Cigarettes  . Smokeless tobacco: Former Neurosurgeon  . Alcohol Use: No    OB History   Grav Para Term Preterm Abortions TAB SAB Ect Mult Living   2    2  2          Review of Systems All other systems negative except as documented in the HPI. All pertinent positives and negatives as reviewed in the HPI. Allergies  Review of patient's allergies indicates no known allergies.  Home Medications   Current Outpatient Rx  Name  Route  Sig  Dispense  Refill  . ibuprofen (ADVIL,MOTRIN) 200 MG tablet   Oral   Take 800 mg by mouth every other day as  needed. For pain           BP 140/82  Pulse 80  Temp(Src) 98.6 F (37 C)  Resp 16  SpO2 99%  LMP 08/28/2012  Physical Exam  Nursing note and vitals reviewed. Constitutional: She is oriented to person, place, and time. She appears well-developed and well-nourished.  HENT:  Head: Normocephalic and atraumatic.  Eyes: EOM are normal. Pupils are equal, round, and reactive to light. Right eye exhibits hordeolum. Right eye exhibits no discharge and no exudate. No foreign body present in the right eye.    Neck: Normal range of motion. Neck supple.  Cardiovascular: Normal rate and regular rhythm.   Pulmonary/Chest: Effort normal.  Neurological: She is alert and oriented to person, place, and time.  Skin: Skin is warm and dry. No rash noted.    ED Course  Procedures (including critical care time) Patient is advised to continue to use warm compresses as much possible.  Told to return here as needed.  Advised her to followup with the ophthalmologist provided as needed.   MDM          Carlyle Dolly, PA-C 08/28/12 1005

## 2012-08-28 NOTE — ED Provider Notes (Signed)
Medical screening examination/treatment/procedure(s) were performed by non-physician practitioner and as supervising physician I was immediately available for consultation/collaboration.   Glynn Octave, MD 08/28/12 [phone]

## 2012-08-28 NOTE — ED Notes (Signed)
Pt reports redness, pain, itching to right eye.

## 2013-01-06 ENCOUNTER — Emergency Department (HOSPITAL_BASED_OUTPATIENT_CLINIC_OR_DEPARTMENT_OTHER): Payer: Worker's Compensation

## 2013-01-06 ENCOUNTER — Emergency Department (HOSPITAL_BASED_OUTPATIENT_CLINIC_OR_DEPARTMENT_OTHER)
Admission: EM | Admit: 2013-01-06 | Discharge: 2013-01-06 | Disposition: A | Discharge status: Home / Self Care | Payer: Worker's Compensation | Attending: Emergency Medicine | Admitting: Emergency Medicine

## 2013-01-06 ENCOUNTER — Encounter (HOSPITAL_BASED_OUTPATIENT_CLINIC_OR_DEPARTMENT_OTHER): Payer: Self-pay | Admitting: Emergency Medicine

## 2013-01-06 DIAGNOSIS — F17200 Nicotine dependence, unspecified, uncomplicated: Secondary | ICD-10-CM | POA: Insufficient documentation

## 2013-01-06 DIAGNOSIS — Z8669 Personal history of other diseases of the nervous system and sense organs: Secondary | ICD-10-CM | POA: Insufficient documentation

## 2013-01-06 DIAGNOSIS — Y990 Civilian activity done for income or pay: Secondary | ICD-10-CM | POA: Insufficient documentation

## 2013-01-06 DIAGNOSIS — Y9289 Other specified places as the place of occurrence of the external cause: Secondary | ICD-10-CM | POA: Insufficient documentation

## 2013-01-06 DIAGNOSIS — W1809XA Striking against other object with subsequent fall, initial encounter: Secondary | ICD-10-CM | POA: Insufficient documentation

## 2013-01-06 DIAGNOSIS — Z792 Long term (current) use of antibiotics: Secondary | ICD-10-CM | POA: Insufficient documentation

## 2013-01-06 DIAGNOSIS — Y9389 Activity, other specified: Secondary | ICD-10-CM | POA: Insufficient documentation

## 2013-01-06 DIAGNOSIS — S0003XA Contusion of scalp, initial encounter: Secondary | ICD-10-CM | POA: Insufficient documentation

## 2013-01-06 MED ORDER — ACETAMINOPHEN 500 MG PO TABS
1000.0000 mg | ORAL_TABLET | Freq: Once | ORAL | Status: AC
  Administered 2013-01-06: 1000 mg via ORAL
  Filled 2013-01-06: qty 2

## 2013-01-06 MED ORDER — IBUPROFEN 600 MG PO TABS: 600.0000 mg | ORAL_TABLET | Freq: Four times a day (QID) | ORAL | Status: DC | PRN
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
## 2013-01-06 NOTE — ED Notes (Signed)
Drug screen complete.

## 2013-01-06 NOTE — ED Provider Notes (Signed)
CSN: [id number]     Arrival date & time 01/06/13  4098 History   None    Chief Complaint  Patient presents with  . Head Injury   (Consider location/radiation/quality/duration/timing/severity/associated sxs/prior Treatment) Patient is a 27 y.o. female presenting with head injury. The history is provided by the patient.  Head Injury Head/neck injury location: top of head a cart for holding boxes fell 1 foot onto top of head causing a cephalohematoma. Mechanism of injury: direct blow   Pain details:    Quality:  Aching   Severity:  Moderate   Timing:  Constant   Progression:  Unchanged Chronicity:  New Relieved by:  Nothing Worsened by:  Nothing tried Ineffective treatments:  None tried Associated symptoms: no blurred vision, no difficulty breathing, no disorientation, no double vision, no focal weakness, no hearing loss, no loss of consciousness, no memory loss, no neck pain, no numbness and no vomiting   Risk factors: no alcohol use     Past Medical History  Diagnosis Date  . Carpal tunnel syndrome 03-2011    BILATERAL RECEIVES INJECTIONS   Past Surgical History  Procedure Laterality Date  . Breast surgery  2009    left breast bx-benign   Family History  Problem Relation Age of Onset  . Breast cancer Mother     2005 AND 2013  . Diabetes Father   . Diabetes Maternal Grandfather   . Breast cancer Paternal Grandmother   . Breast cancer Paternal Grandfather    History  Substance Use Topics  . Smoking status: Current Every Day Smoker -- 1.00 packs/day    Types: Cigarettes  . Smokeless tobacco: Never Used  . Alcohol Use: No   OB History   Grav Para Term Preterm Abortions TAB SAB Ect Mult Living   2    2  2         Review of Systems  HENT: Negative for hearing loss.   Eyes: Negative for blurred vision and double vision.  Gastrointestinal: Negative for vomiting.  Musculoskeletal: Negative for neck pain.  Neurological: Negative for focal weakness, loss of  consciousness and numbness.  Psychiatric/Behavioral: Negative for memory loss.  All other systems reviewed and are negative.    Allergies  Review of patient's allergies indicates no known allergies.  Home Medications   Current Outpatient Rx  Name  Route  Sig  Dispense  Refill  . acetaminophen-codeine (TYLENOL #3) 300-30 MG per tablet   Oral   Take 1 tablet by mouth every 6 (six) hours as needed for pain.   15 tablet   0   . amoxicillin-clavulanate (AUGMENTIN) 875-125 MG per tablet   Oral   Take 1 tablet by mouth 2 (two) times daily.   20 tablet   0   . erythromycin ophthalmic ointment      Place a 1/2 inch ribbon of ointment into the lower eyelid.   1 g   0   . ibuprofen (ADVIL,MOTRIN) 200 MG tablet   Oral   Take 800 mg by mouth every other day as needed. For pain          There were no vitals taken for this visit. Physical Exam  Constitutional: She is oriented to person, place, and time. She appears well-developed and well-nourished.  HENT:  Head: Normocephalic. Head is without raccoon's eyes and without Battle's sign.    Right Ear: No mastoid tenderness. No hemotympanum.  Left Ear: No mastoid tenderness. No hemotympanum.  Mouth/Throat: Oropharynx is clear and moist.  Eyes: Conjunctivae and EOM are normal. Pupils are equal, round, and reactive to light.  Neck: Normal range of motion. Neck supple. No tracheal deviation present.  Cardiovascular: Normal rate and regular rhythm.   Pulmonary/Chest: Effort normal and breath sounds normal. She has no wheezes. She has no rales.  Abdominal: Soft. Bowel sounds are normal. There is no tenderness. There is no rebound and no guarding.  Musculoskeletal: Normal range of motion.  Neurological: She is alert and oriented to person, place, and time.  Skin: Skin is warm and dry.  Psychiatric: Her mood appears anxious.    ED Course  Procedures (including critical care time) Labs Review Labs Reviewed - No data to  display Imaging Review No results found.  EKG Interpretation   None       MDM  No diagnosis found. Ice the affected area for 20 minutes every four hours x 2 days and alternate tylenol and ibuprofen every six hours as needed for pain   Lahna Nath K Elyan Vanwieren-Rasch, MD 01/06/13 1610

## 2013-01-20 IMAGING — US US TRANSVAGINAL NON-OB
1 series · 14 of 25 positions shown · non-contrast
Comparison: 12/17/2009

CLINICAL DATA: Pelvic pain

TRANSABDOMINAL AND TRANSVAGINAL ULTRASOUND OF PELVIS
TECHNIQUE: Both transabdominal and transvaginal ultrasound
examinations of the pelvis were performed. Transabdominal technique
was performed for global imaging of the pelvis including uterus,
ovaries, adnexal regions, and pelvic cul-de-sac.

[Series 1: us transvaginal non-ob · 0.28mm/px · 14 of 57 slices shown]
[im 1/57]
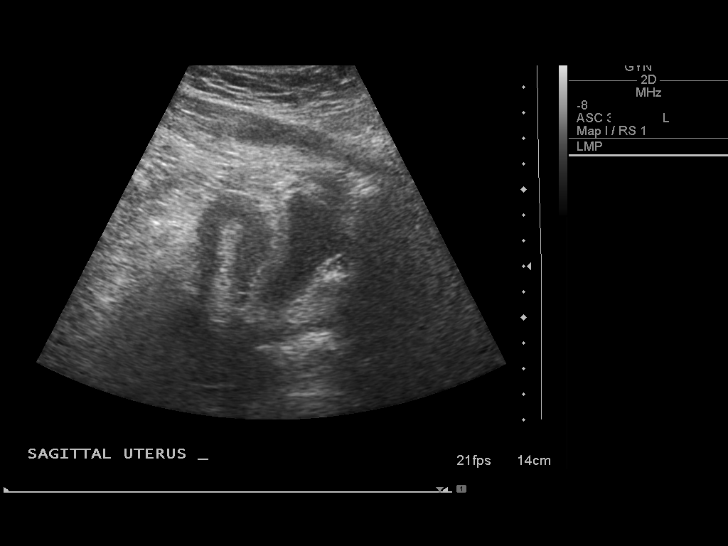
[im 5/57]
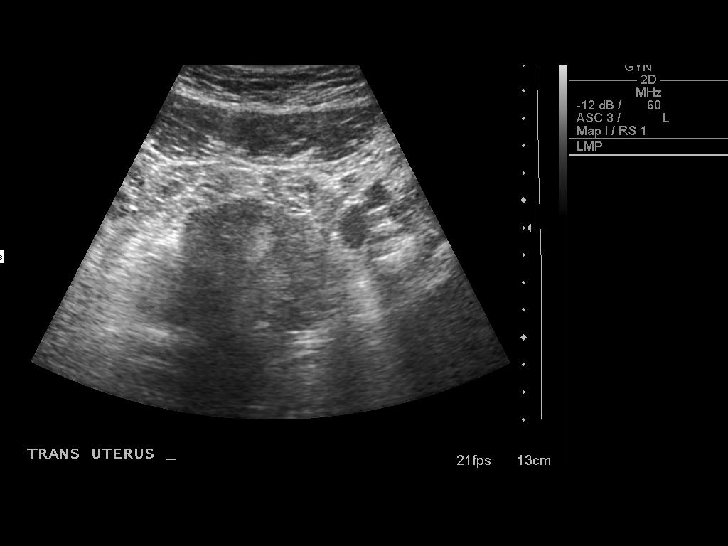
[im 10/57]
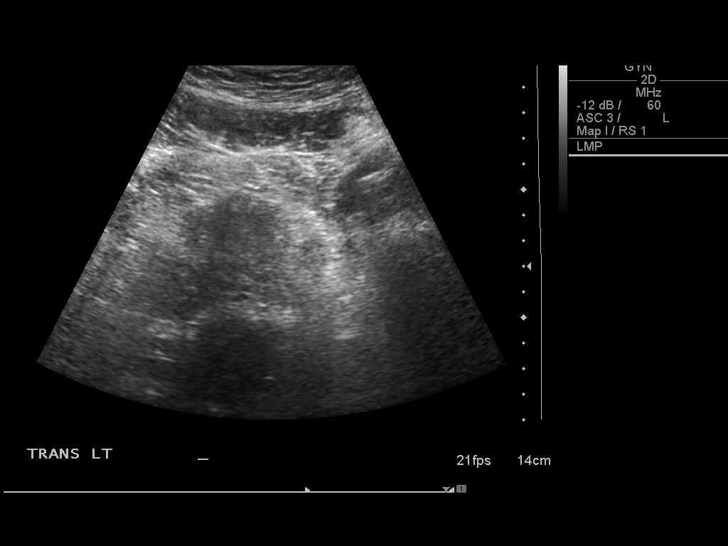
[im 15/57]
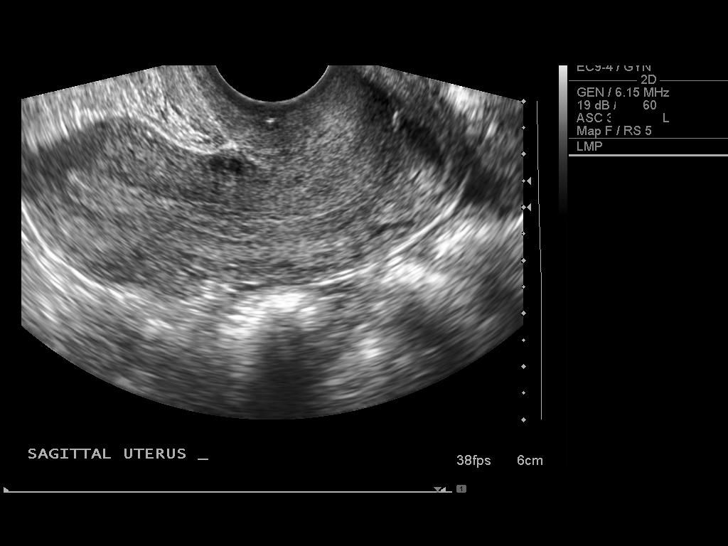
[im 19/57]
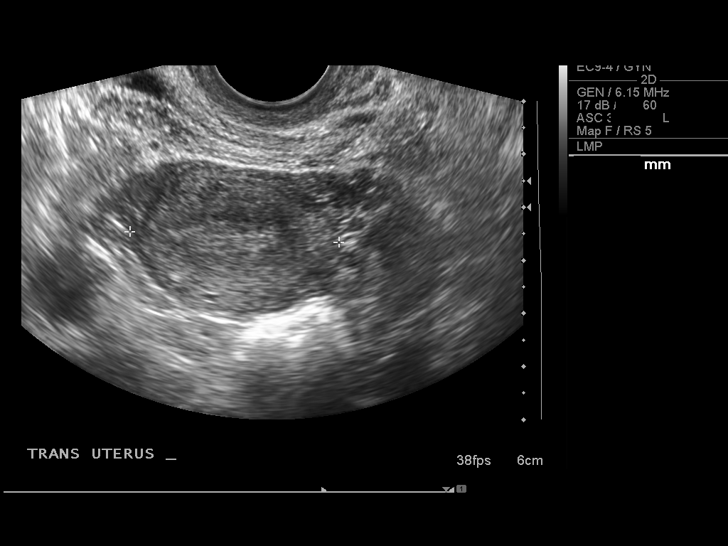
[im 22/57]
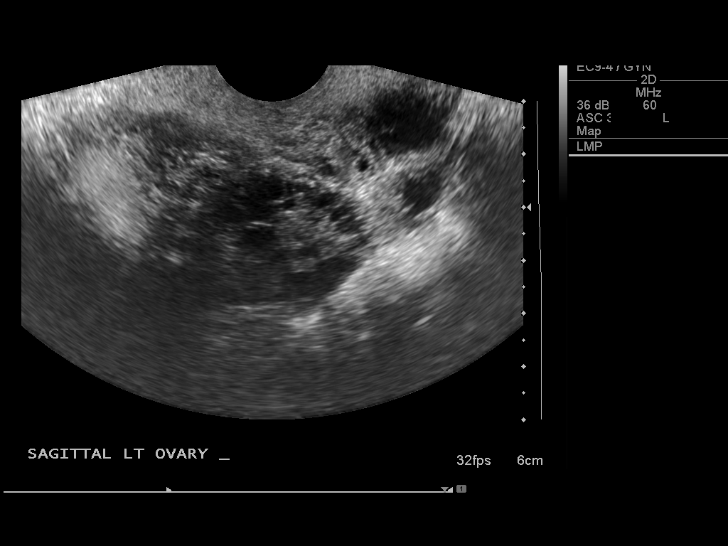
[im 26/57]
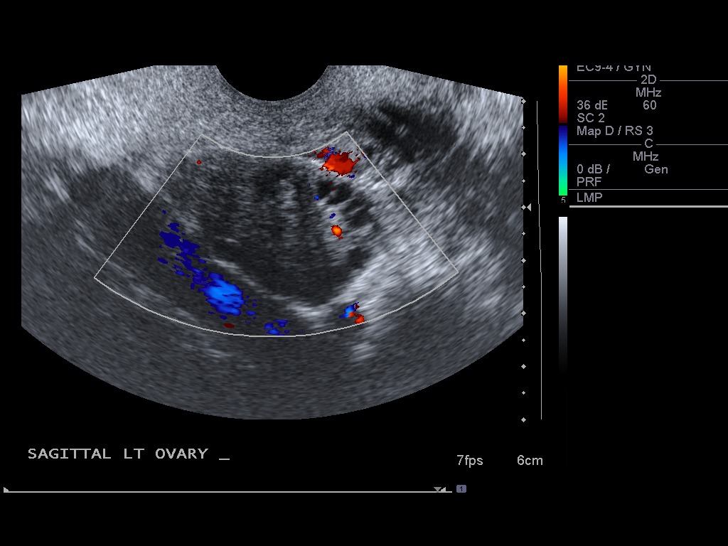
[im 31/57]
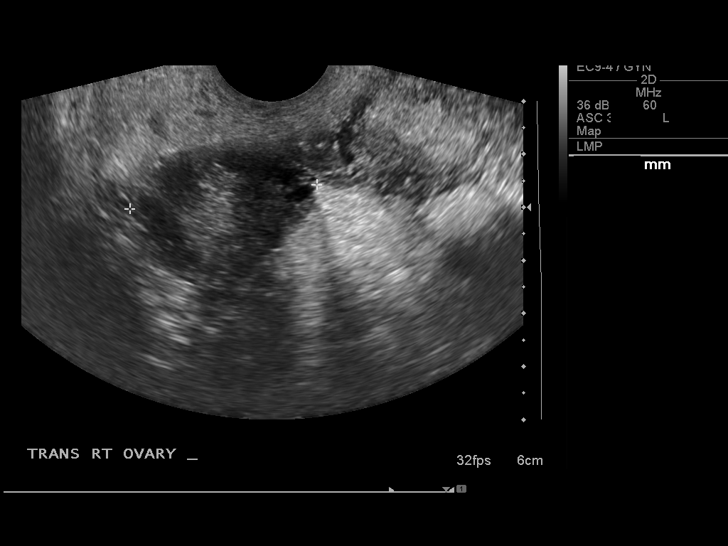
[im 36/57]
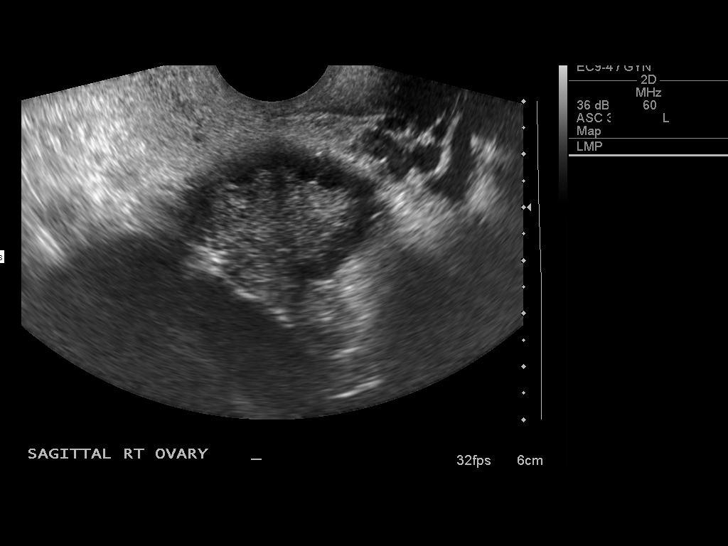
[im 38/57]
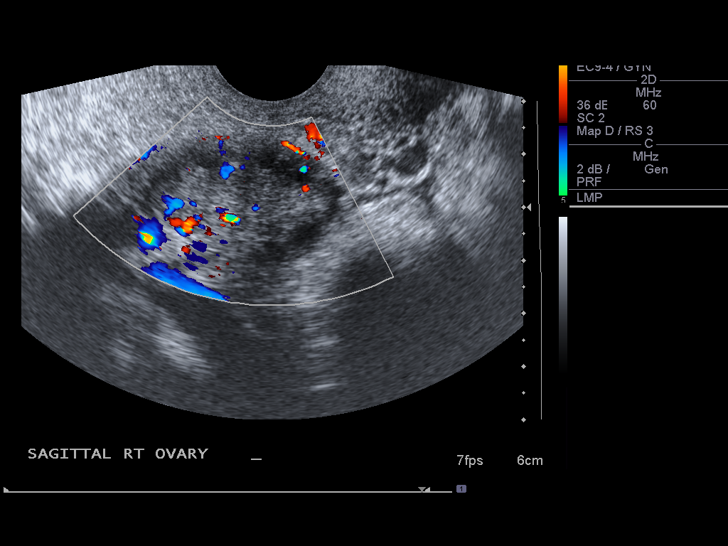
[im 43/57]
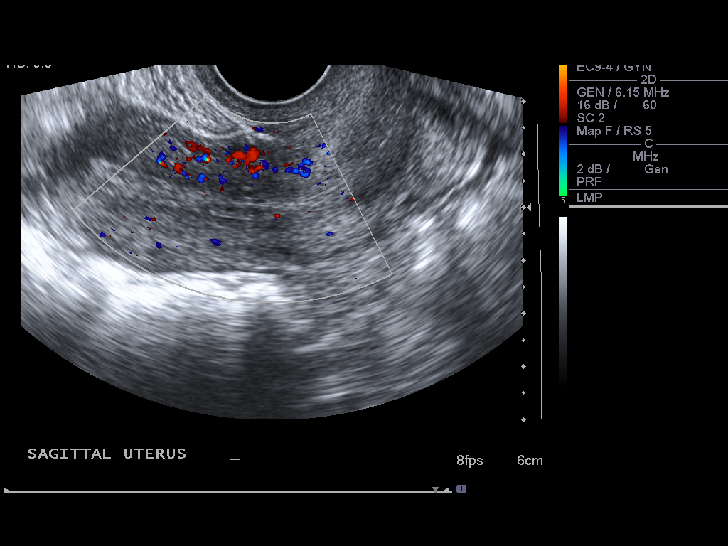
[im 47/57]
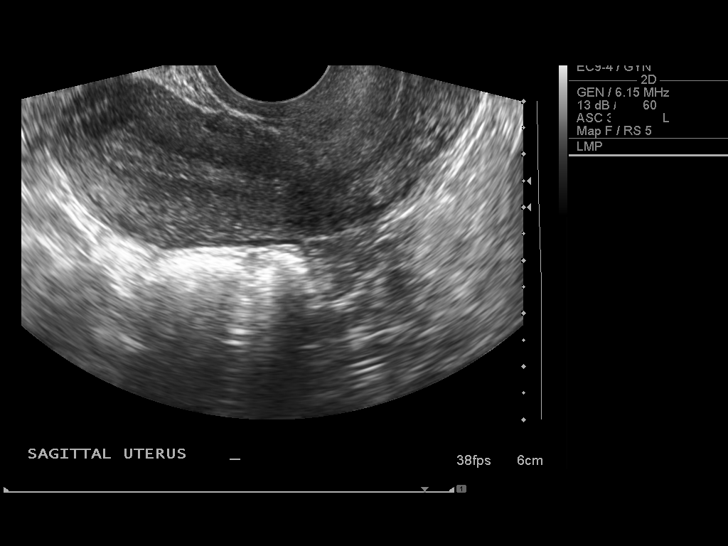
[im 52/57]
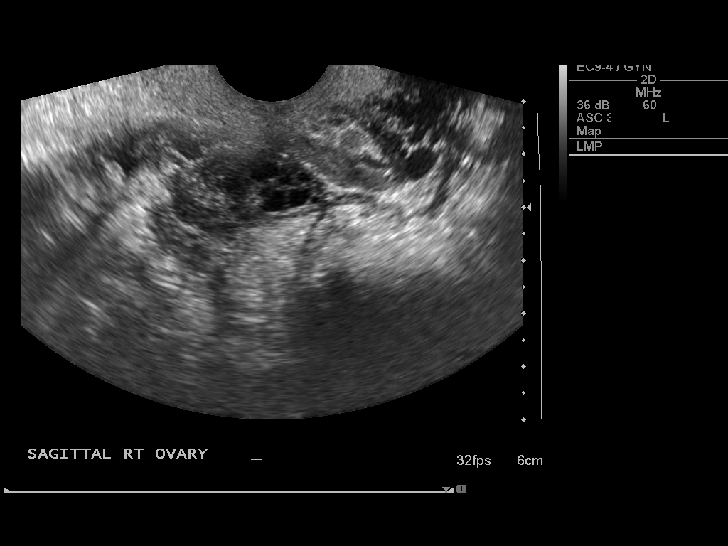
[im 57/57]
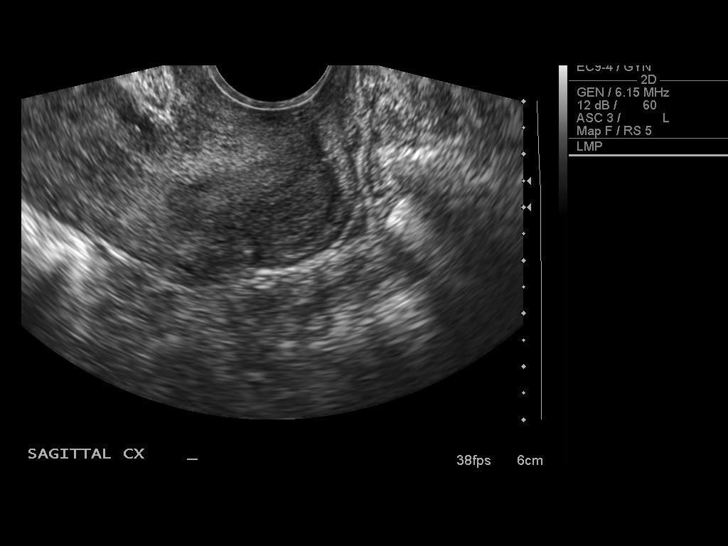

[14 of 25 positions shown; findings below may reference images not displayed]

It was necessary to proceed with endovaginal exam following the
transabdomnial exam to visualize the ovaries, neither visualized
transabdominally.
FINDINGS: Uterus: 7.5 x 3.9 x 3.0 cm.  Normal.

Endometrium: 9 mm.  Uniformly echogenic.

Right ovary:  4.9 x 3.6 x 2.9 cm.  Numerous peripheral follicles.

Left ovary: 4.1 x 3.5 x 2.6 cm.  Numerous peripheral follicles.

Other findings: Trace free fluid in the cul-de-sac.
IMPRESSION: No acute finding to account for the history of pelvic pain.
Bilateral enlarged ovaries with multiple peripheral follicles,
which meets sonographic criteria for diagnosis of polycystic
ovarian syndrome.

## 2013-04-29 ENCOUNTER — Ambulatory Visit: Payer: No Typology Code available for payment source

## 2013-07-06 ENCOUNTER — Other Ambulatory Visit: Payer: Self-pay | Admitting: Physician Assistant

## 2013-07-06 ENCOUNTER — Encounter: Payer: Self-pay | Admitting: Physician Assistant

## 2013-07-06 ENCOUNTER — Ambulatory Visit: Payer: No Typology Code available for payment source

## 2013-07-06 ENCOUNTER — Ambulatory Visit (INDEPENDENT_AMBULATORY_CARE_PROVIDER_SITE_OTHER): Payer: No Typology Code available for payment source | Admitting: Physician Assistant

## 2013-07-06 VITALS — BP 126/86 | HR 74 | Temp 98.3°F | Resp 16 | Ht 68.0 in | Wt 254.0 lb

## 2013-07-06 DIAGNOSIS — R55 Syncope and collapse: Secondary | ICD-10-CM

## 2013-07-06 DIAGNOSIS — M542 Cervicalgia: Secondary | ICD-10-CM

## 2013-07-06 LAB — POCT URINALYSIS DIPSTICK
BILIRUBIN UA: NEGATIVE
GLUCOSE UA: NEGATIVE
Ketones, UA: NEGATIVE
Leukocytes, UA: NEGATIVE
Nitrite, UA: NEGATIVE
PH UA: 5.5
Protein, UA: NEGATIVE
Urobilinogen, UA: 0.2

## 2013-07-06 LAB — POCT CBC
GRANULOCYTE PERCENT: 67.2 % (ref 37–80)
HEMATOCRIT: 39.4 % (ref 37.7–47.9)
Hemoglobin: 12.3 g/dL (ref 12.2–16.2)
LYMPH, POC: 2.4 (ref 0.6–3.4)
MCH, POC: 27.4 pg (ref 27–31.2)
MCHC: 31.2 g/dL — AB (ref 31.8–35.4)
MCV: 87.7 fL (ref 80–97)
MID (CBC): 0.5 (ref 0–0.9)
MPV: 8.1 fL (ref 0–99.8)
PLATELET COUNT, POC: 370 10*3/uL (ref 142–424)
POC GRANULOCYTE: 6 (ref 2–6.9)
POC LYMPH %: 26.8 % (ref 10–50)
POC MID %: 6 %M (ref 0–12)
RBC: 4.49 M/uL (ref 4.04–5.48)
RDW, POC: 13.6 %
WBC: 8.9 10*3/uL (ref 4.6–10.2)

## 2013-07-06 LAB — POCT UA - MICROSCOPIC ONLY
Casts, Ur, LPF, POC: NEGATIVE
Crystals, Ur, HPF, POC: NEGATIVE
MUCUS UA: NEGATIVE
Yeast, UA: NEGATIVE

## 2013-07-06 LAB — COMPREHENSIVE METABOLIC PANEL
ALT: 33 U/L (ref 0–35)
AST: 23 U/L (ref 0–37)
Albumin: 4.2 g/dL (ref 3.5–5.2)
Alkaline Phosphatase: 38 U/L — ABNORMAL LOW (ref 39–117)
BILIRUBIN TOTAL: 0.5 mg/dL (ref 0.2–1.2)
BUN: 10 mg/dL (ref 6–23)
CALCIUM: 9.3 mg/dL (ref 8.4–10.5)
CHLORIDE: 104 meq/L (ref 96–112)
CO2: 23 meq/L (ref 19–32)
CREATININE: 0.81 mg/dL (ref 0.50–1.10)
Glucose, Bld: 83 mg/dL (ref 70–99)
Potassium: 4.2 mEq/L (ref 3.5–5.3)
Sodium: 134 mEq/L — ABNORMAL LOW (ref 135–145)
Total Protein: 7.4 g/dL (ref 6.0–8.3)

## 2013-07-06 LAB — GLUCOSE, POCT (MANUAL RESULT ENTRY): POC GLUCOSE: 77 mg/dL (ref 70–99)

## 2013-07-06 LAB — TSH: TSH: 1.416 u[IU]/mL (ref 0.350–4.500)

## 2013-07-06 MED ORDER — CYCLOBENZAPRINE HCL 10 MG PO TABS: 10.0000 mg | ORAL_TABLET | Freq: Every day | ORAL | Status: DC

## 2013-07-06 MED ORDER — MELOXICAM 15 MG PO TABS: 15.0000 mg | ORAL_TABLET | Freq: Every day | ORAL | Status: DC

## 2013-07-06 NOTE — Patient Instructions (Signed)
Take Flexeril and meloxicam at bedtime because it can make you drowsy.  You can also use a heating pad when you feel your neck muscles spasming.   You will be contacted directly to follow up with a Cardiologist. If spasming gets worse let us know and we will plan to consult an Orthopedic specialist.

## 2013-07-06 NOTE — Progress Notes (Signed)
I have examined this patient along with the student and agree.  

## 2013-07-06 NOTE — Progress Notes (Signed)
Subjective:    Patient ID: Krystal Edwards, female    DOB: 10/04/1985, 28 y.o.   MRN: [id number]  Hypertension Associated symptoms include headaches.  Dizziness Associated symptoms include headaches. Pertinent negatives include no chills, congestion, fatigue, fever or rash.     28y.o female presents after fainting at work yesterday.  For past 2 weeks every day around mid day she has had episodes of cramping in her neck causing her head to hurt and also makes her "feel like her head is on fire."  When her neck is cramping she can see her muscles behind her ear tense and contracted in the mirror.  Splashing water on her face relieves these symptoms.  Yesterday at work while standing up to go splash water on her face she felt dizzy and fainted.  Pt states easing herself to the ground without hitting her head.  Estimated LOC 2 mins.  BP taken by nurse at work 15 mins after LOC 197/85.  Denies hx of arrythmia, palpitations, nausea, vomiting.  Denies prodrome before onset of dizziness or any post event confusion.  Pt states 2 years ago she passed out and was evaluated in ER with nml EKG.  It was considered to be a result of HTN and she was put on lisinopril which she has since d/c because she changed insurances and has not seen a new doctor.  Also diagnosed with cephalohematoma after having a shelf fall onto her head at work in 12/2012.  CT head negative at that time.    Review of Systems  Constitutional: Negative for fever, chills and fatigue.  HENT: Negative for congestion, postnasal drip and rhinorrhea.   Eyes: Negative.   Respiratory: Negative.   Cardiovascular: Negative.   Gastrointestinal: Negative.   Endocrine: Negative.   Genitourinary: Negative.   Skin: Negative for color change and rash.  Allergic/Immunologic: Negative.   Neurological: Positive for headaches.  Hematological: Negative.        Objective:   Physical Exam  Constitutional: She is oriented to person, place, and time.  She appears well-developed and well-nourished. No distress.  BP 126/86  Pulse 74  Temp(Src) 98.3 F (36.8 C) (Oral)  Resp 16  Ht 5\' 8"  (1.727 m)  Wt 254 lb (115.214 kg)  BMI 38.63 kg/m2  SpO2 99%  LMP 06/17/2013   HENT:  Head: Normocephalic.  Eyes: Conjunctivae and EOM are normal. Pupils are equal, round, and reactive to light.  Fundoscopic exam:      The right eye shows no papilledema.       The left eye shows no papilledema.  Neck: Normal range of motion. No tracheal deviation present. No thyromegaly present.  Cardiovascular: Normal rate, regular rhythm, normal heart sounds and intact distal pulses.  Exam reveals no gallop and no friction rub.   No murmur heard. Pulmonary/Chest: Effort normal and breath sounds normal. No respiratory distress. She has no wheezes.  Musculoskeletal:       Cervical back: She exhibits tenderness.  Neurological: She is alert and oriented to person, place, and time. She has normal strength and normal reflexes. No cranial nerve deficit or sensory deficit. She displays a negative Romberg sign. Coordination and gait normal.  Skin: Skin is warm and dry. No rash noted.  Psychiatric: She has a normal mood and affect. Her behavior is normal.    Soft tissue lat neck film reviewed with Dr. Lorelei Pont: Loss of lordotic curve.  Otherwise normal neck.  EKG reviewed with Dr. Lorelei Pont:  NSR, short PRi  108  Results for orders placed in visit on 07/06/13  POCT CBC      Result Value Ref Range   WBC 8.9  4.6 - 10.2 K/uL   Lymph, poc 2.4  0.6 - 3.4   POC LYMPH PERCENT 26.8  10 - 50 %L   MID (cbc) 0.5  0 - 0.9   POC MID % 6.0  0 - 12 %M   POC Granulocyte 6.0  2 - 6.9   Granulocyte percent 67.2  37 - 80 %G   RBC 4.49  4.04 - 5.48 M/uL   Hemoglobin 12.3  12.2 - 16.2 g/dL   HCT, POC 39.4  37.7 - 47.9 %   MCV 87.7  80 - 97 fL   MCH, POC 27.4  27 - 31.2 pg   MCHC 31.2 (*) 31.8 - 35.4 g/dL   RDW, POC 13.6     Platelet Count, POC 370  142 - 424 K/uL   MPV 8.1  0 -  99.8 fL  GLUCOSE, POCT (MANUAL RESULT ENTRY)      Result Value Ref Range   POC Glucose 77  70 - 99 mg/dl  POCT UA - MICROSCOPIC ONLY      Result Value Ref Range   WBC, Ur, HPF, POC 0-1     RBC, urine, microscopic 0-1     Bacteria, U Microscopic trace     Mucus, UA neg     Epithelial cells, urine per micros 4-8     Crystals, Ur, HPF, POC neg     Casts, Ur, LPF, POC neg     Yeast, UA neg    POCT URINALYSIS DIPSTICK      Result Value Ref Range   Color, UA yellow     Clarity, UA clear     Glucose, UA neg     Bilirubin, UA neg     Ketones, UA neg     Spec Grav, UA >=1.030     Blood, UA trace-intact     pH, UA 5.5     Protein, UA neg     Urobilinogen, UA 0.2     Nitrite, UA neg     Leukocytes, UA Negative         Assessment & Plan:   1. Syncope CBC, glucose, UA normal.  Awaiting results of Cmet and TSH.  EKG shows short PRi 108 otherwise normal.  Referred to Cardiologist for follow up. - POCT CBC - POCT glucose (manual entry) - TSH - Comprehensive metabolic panel - POCT UA - Microscopic Only - POCT urinalysis dipstick - EKG 12-Lead - Ambulatory referral to Cardiology  2. Neck pain Will try mobic and flexeril for muscle spasm.  If symptoms worsen, will refer to Ortho for further evaluation and treatment. - DG Neck Soft Tissue; Future - meloxicam (MOBIC) 15 MG tablet; Take 1 tablet (15 mg total) by mouth daily.  Dispense: 30 tablet; Refill: 0 - cyclobenzaprine (FLEXERIL) 10 MG tablet; Take 1 tablet (10 mg total) by mouth at bedtime.  Dispense: 30 tablet; Refill: 0

## 2013-07-28 ENCOUNTER — Encounter: Payer: Self-pay | Admitting: *Deleted

## 2013-08-01 ENCOUNTER — Institutional Professional Consult (permissible substitution): Payer: Self-pay | Admitting: Cardiology

## 2013-11-04 ENCOUNTER — Encounter (HOSPITAL_COMMUNITY): Payer: Self-pay | Admitting: Emergency Medicine

## 2013-11-04 ENCOUNTER — Emergency Department (HOSPITAL_COMMUNITY)
Admission: EM | Admit: 2013-11-04 | Discharge: 2013-11-04 | Disposition: A | Discharge status: Home / Self Care | Payer: No Typology Code available for payment source | Attending: Emergency Medicine | Admitting: Emergency Medicine

## 2013-11-04 DIAGNOSIS — Z8669 Personal history of other diseases of the nervous system and sense organs: Secondary | ICD-10-CM | POA: Insufficient documentation

## 2013-11-04 DIAGNOSIS — Z3202 Encounter for pregnancy test, result negative: Secondary | ICD-10-CM | POA: Insufficient documentation

## 2013-11-04 DIAGNOSIS — N760 Acute vaginitis: Secondary | ICD-10-CM | POA: Insufficient documentation

## 2013-11-04 DIAGNOSIS — A499 Bacterial infection, unspecified: Secondary | ICD-10-CM | POA: Insufficient documentation

## 2013-11-04 DIAGNOSIS — N949 Unspecified condition associated with female genital organs and menstrual cycle: Secondary | ICD-10-CM | POA: Insufficient documentation

## 2013-11-04 DIAGNOSIS — F17200 Nicotine dependence, unspecified, uncomplicated: Secondary | ICD-10-CM | POA: Insufficient documentation

## 2013-11-04 DIAGNOSIS — B9689 Other specified bacterial agents as the cause of diseases classified elsewhere: Secondary | ICD-10-CM | POA: Insufficient documentation

## 2013-11-04 LAB — URINALYSIS, ROUTINE W REFLEX MICROSCOPIC
Bilirubin Urine: NEGATIVE
GLUCOSE, UA: NEGATIVE mg/dL
HGB URINE DIPSTICK: NEGATIVE
Ketones, ur: NEGATIVE mg/dL
LEUKOCYTES UA: NEGATIVE
Nitrite: NEGATIVE
PH: 6 (ref 5.0–8.0)
PROTEIN: NEGATIVE mg/dL
SPECIFIC GRAVITY, URINE: 1.028 (ref 1.005–1.030)
Urobilinogen, UA: 1 mg/dL (ref 0.0–1.0)

## 2013-11-04 LAB — WET PREP, GENITAL
TRICH WET PREP: NONE SEEN
YEAST WET PREP: NONE SEEN

## 2013-11-04 LAB — POC URINE PREG, ED: Preg Test, Ur: NEGATIVE

## 2013-11-04 MED ORDER — METRONIDAZOLE 500 MG PO TABS: 500.0000 mg | ORAL_TABLET | Freq: Two times a day (BID) | ORAL | Status: DC

## 2013-11-04 MED ORDER — NAPROXEN 250 MG PO TABS
500.0000 mg | ORAL_TABLET | Freq: Once | ORAL | Status: AC
Start: 2013-11-04 — End: 2013-11-04
  Administered 2013-11-04: 500 mg via ORAL
  Filled 2013-11-04: qty 2

## 2013-11-04 NOTE — ED Provider Notes (Signed)
CSN: [id number]     Arrival date & time 11/04/13  1616 History   First MD Initiated Contact with Patient 11/04/13 1650     Chief Complaint  Patient presents with  . Pelvic Pain     (Consider location/radiation/quality/duration/timing/severity/associated sxs/prior Treatment) Patient is a 29 y.o. female presenting with female genitourinary complaint. The history is provided by the patient.  Female GU Problem This is a new problem. The current episode started today. The problem occurs constantly. The problem has been unchanged. Pertinent negatives include no arthralgias, chest pain, chills, congestion, fever, headaches, myalgias, nausea or vomiting. Nothing aggravates the symptoms. She has tried nothing for the symptoms. The treatment provided no relief.   28yo F with a chief complaint of pelvic pain. Patient states this started last night. Patient states that the pain is sharp midline has no radiation to either side. Patient with a history of polycystic ovarian syndrome. Patient has been pregnant once before which resulted in miscarriage. Patient denies any vaginal bleeding any vaginal discharge. Patient denies any fevers or chills. Last bowel movement was last night and normal. Patient has abnormal menstrual cycles.  Past Medical History  Diagnosis Date  . Carpal tunnel syndrome 03-2011    BILATERAL RECEIVES INJECTIONS  . Oligo-ovulation    Past Surgical History  Procedure Laterality Date  . Breast surgery  2009    left breast bx-benign   Family History  Problem Relation Age of Onset  . Breast cancer Mother     2005 AND 2013  . Diabetes Father   . Diabetes Maternal Grandfather   . Breast cancer Paternal Grandmother   . Breast cancer Paternal Grandfather    History  Substance Use Topics  . Smoking status: Current Every Day Smoker -- 0.50 packs/day for 9 years    Types: Cigarettes  . Smokeless tobacco: Never Used  . Alcohol Use: No   OB History   Grav Para Term Preterm  Abortions TAB SAB Ect Mult Living   2    2  2         Review of Systems  Constitutional: Negative for fever and chills.  HENT: Negative for congestion and rhinorrhea.   Eyes: Negative for redness and visual disturbance.  Respiratory: Negative for shortness of breath and wheezing.   Cardiovascular: Negative for chest pain and palpitations.  Gastrointestinal: Negative for nausea and vomiting.  Genitourinary: Positive for pelvic pain. Negative for dysuria, urgency, vaginal bleeding and vaginal discharge.  Musculoskeletal: Negative for arthralgias and myalgias.  Skin: Negative for pallor and wound.  Neurological: Negative for dizziness and headaches.      Allergies  Review of patient's allergies indicates no known allergies.  Home Medications   Prior to Admission medications   Medication Sig Start Date End Date Taking? Authorizing Provider  acetaminophen (TYLENOL) 500 MG tablet Take 500 mg by mouth every 6 (six) hours as needed for mild pain.   Yes Historical Provider, MD  naproxen sodium (ANAPROX) 220 MG tablet Take 220 mg by mouth daily as needed. For pain   Yes Historical Provider, MD  metroNIDAZOLE (FLAGYL) 500 MG tablet Take 1 tablet (500 mg total) by mouth 2 (two) times daily. 11/04/13   Deno Etienne, MD   BP 143/67  Pulse 89  Temp(Src) 98.5 F (36.9 C) (Oral)  Resp 18  SpO2 98%  LMP 10/17/2013 Physical Exam  Constitutional: She is oriented to person, place, and time. She appears well-developed and well-nourished. No distress.  HENT:  Head: Normocephalic and atraumatic.  Eyes: EOM are normal. Pupils are equal, round, and reactive to light.  Neck: Normal range of motion. Neck supple.  Cardiovascular: Normal rate and regular rhythm.  Exam reveals no gallop and no friction rub.   No murmur heard. Pulmonary/Chest: Effort normal. She has no wheezes. She has no rales.  Abdominal: Soft. She exhibits no distension. There is no tenderness.  Genitourinary: Cervix exhibits discharge  (white thick). Cervix exhibits no motion tenderness. Right adnexum displays no mass, no tenderness and no fullness. Left adnexum displays no mass, no tenderness and no fullness.  Musculoskeletal: She exhibits no edema and no tenderness.  Neurological: She is alert and oriented to person, place, and time.  Skin: Skin is warm and dry. She is not diaphoretic.  Psychiatric: She has a normal mood and affect. Her behavior is normal.    ED Course  Procedures (including critical care time) Labs Review Labs Reviewed  WET PREP, GENITAL - Abnormal; Notable for the following:    Clue Cells Wet Prep HPF POC FEW (*)    WBC, Wet Prep HPF POC FEW (*)    All other components within normal limits  GC/CHLAMYDIA PROBE AMP  URINALYSIS, ROUTINE W REFLEX MICROSCOPIC  POC URINE PREG, ED    Imaging Review No results found.   EKG Interpretation None      MDM   Final diagnoses:  BV (bacterial vaginosis)    28 yo F with a chief complaint of midline pelvic pain. Patient denies any adnexal tenderness. Patient with thick white discharge on pelvic exam. Patient not pregnant. Feel the need for ultrasound at this time.  Patient with negative urine patient with clue cells on wet prep. We'll treat patient for bacterial vaginosis she will follow up with OB and a PCP.  6:03 PM:  I have discussed the diagnosis/risks/treatment options with the patient and believe the pt to be eligible for discharge home to follow-up with GYN, PCP. We also discussed returning to the ED immediately if new or worsening sx occur. We discussed the sx which are most concerning (e.g., worsening one sided pain, uncontrolled vomiting, vaginal bleeding) that necessitate immediate return. Medications administered to the patient during their visit and any new prescriptions provided to the patient are listed below.  Medications given during this visit Medications  naproxen (NAPROSYN) tablet 500 mg (not administered)    New Prescriptions    METRONIDAZOLE (FLAGYL) 500 MG TABLET    Take 1 tablet (500 mg total) by mouth 2 (two) times daily.     Deno Etienne, MD 11/04/13 [phone]

## 2013-11-04 NOTE — ED Notes (Signed)
Per pt sts she was intimate with her boyfriend last night and had a sharp pain in her pelvic area. sts also some cramping. sts sitting or lying hurts worse. sts she was feeling better and then the sharp pain came again. Denies any bleeding.

## 2013-11-04 NOTE — Discharge Instructions (Signed)
Follow up with OB and a PCP.  Return for worsening symptoms.  Take 4 over the counter ibuprofen tablets 3 times a day or 2 over-the-counter naproxen tablets twice a day for pain.   Bacterial Vaginosis Bacterial vaginosis is a vaginal infection that occurs when the normal balance of bacteria in the vagina is disrupted. It results from an overgrowth of certain bacteria. This is the most common vaginal infection in women of childbearing age. Treatment is important to prevent complications, especially in pregnant women, as it can cause a premature delivery. CAUSES  Bacterial vaginosis is caused by an increase in harmful bacteria that are normally present in smaller amounts in the vagina. Several different kinds of bacteria can cause bacterial vaginosis. However, the reason that the condition develops is not fully understood. RISK FACTORS Certain activities or behaviors can put you at an increased risk of developing bacterial vaginosis, including:  Having a new sex partner or multiple sex partners.  Douching.  Using an intrauterine device (IUD) for contraception. Women do not get bacterial vaginosis from toilet seats, bedding, swimming pools, or contact with objects around them. SIGNS AND SYMPTOMS  Some women with bacterial vaginosis have no signs or symptoms. Common symptoms include:  Grey vaginal discharge.  A fishlike odor with discharge, especially after sexual intercourse.  Itching or burning of the vagina and vulva.  Burning or pain with urination. DIAGNOSIS  Your health care provider will take a medical history and examine the vagina for signs of bacterial vaginosis. A sample of vaginal fluid may be taken. Your health care provider will look at this sample under a microscope to check for bacteria and abnormal cells. A vaginal pH test may also be done.  TREATMENT  Bacterial vaginosis may be treated with antibiotic medicines. These may be given in the form of a pill or a vaginal cream. A  second round of antibiotics may be prescribed if the condition comes back after treatment.  HOME CARE INSTRUCTIONS   Only take over-the-counter or prescription medicines as directed by your health care provider.  If antibiotic medicine was prescribed, take it as directed. Make sure you finish it even if you start to feel better.  Do not have sex until treatment is completed.  Tell all sexual partners that you have a vaginal infection. They should see their health care provider and be treated if they have problems, such as a mild rash or itching.  Practice safe sex by using condoms and only having one sex partner. SEEK MEDICAL CARE IF:   Your symptoms are not improving after 3 days of treatment.  You have increased discharge or pain.  You have a fever. MAKE SURE YOU:   Understand these instructions.  Will watch your condition.  Will get help right away if you are not doing well or get worse. FOR MORE INFORMATION  Centers for Disease Control and Prevention, Division of STD Prevention: AppraiserFraud.fi American Sexual Health Association (ASHA): www.ashastd.org  Document Released: 03/09/2005 Document Revised: 12/28/2012 Document Reviewed: 10/19/2012 [facility] Patient Information 2015 [place], [place]. This information is not intended to replace advice given to you by your health care provider. Make sure you discuss any questions you have with your health care provider.

## 2013-11-04 NOTE — ED Notes (Signed)
Pt ambulated to restroom.  Gait steady.  Pt to provide a UA sample.

## 2013-11-05 NOTE — ED Provider Notes (Signed)
I saw and evaluated the patient, reviewed the resident's note and I agree with the findings and plan.   EKG Interpretation None     Patient with pelvic pain. Contact BV. Rather benign exam. We'll treat with Flagyl.  Jasper Riling. Alvino Chapel, MD 11/05/13 6440

## 2013-11-06 LAB — GC/CHLAMYDIA PROBE AMP
CT PROBE, AMP APTIMA: NEGATIVE
GC PROBE AMP APTIMA: NEGATIVE

## 2014-01-19 ENCOUNTER — Emergency Department (HOSPITAL_COMMUNITY)
Admission: EM | Admit: 2014-01-19 | Discharge: 2014-01-20 | Disposition: A | Discharge status: Home / Self Care | Payer: No Typology Code available for payment source | Attending: Emergency Medicine | Admitting: Emergency Medicine

## 2014-01-19 ENCOUNTER — Encounter (HOSPITAL_COMMUNITY): Payer: Self-pay | Admitting: Emergency Medicine

## 2014-01-19 DIAGNOSIS — Z792 Long term (current) use of antibiotics: Secondary | ICD-10-CM | POA: Insufficient documentation

## 2014-01-19 DIAGNOSIS — R05 Cough: Secondary | ICD-10-CM

## 2014-01-19 DIAGNOSIS — R059 Cough, unspecified: Secondary | ICD-10-CM

## 2014-01-19 DIAGNOSIS — J988 Other specified respiratory disorders: Secondary | ICD-10-CM

## 2014-01-19 DIAGNOSIS — Z8669 Personal history of other diseases of the nervous system and sense organs: Secondary | ICD-10-CM | POA: Insufficient documentation

## 2014-01-19 DIAGNOSIS — Z791 Long term (current) use of non-steroidal anti-inflammatories (NSAID): Secondary | ICD-10-CM | POA: Insufficient documentation

## 2014-01-19 DIAGNOSIS — Z720 Tobacco use: Secondary | ICD-10-CM | POA: Insufficient documentation

## 2014-01-19 DIAGNOSIS — B9789 Other viral agents as the cause of diseases classified elsewhere: Secondary | ICD-10-CM

## 2014-01-19 DIAGNOSIS — J069 Acute upper respiratory infection, unspecified: Secondary | ICD-10-CM | POA: Insufficient documentation

## 2014-01-19 DIAGNOSIS — Z8742 Personal history of other diseases of the female genital tract: Secondary | ICD-10-CM | POA: Insufficient documentation

## 2014-01-19 NOTE — ED Notes (Signed)
The pt has had a cough for kover a week.  Initially she had a temp but not anymore.  The cough is dry hacking .  lmp  This month

## 2014-01-20 ENCOUNTER — Emergency Department (HOSPITAL_COMMUNITY): Payer: No Typology Code available for payment source

## 2014-01-20 MED ORDER — HYDROCOD POLST-CHLORPHEN POLST 10-8 MG/5ML PO LQCR: 5.0000 mL | Freq: Two times a day (BID) | ORAL | Status: DC | PRN

## 2014-01-20 MED ORDER — ALBUTEROL SULFATE HFA 108 (90 BASE) MCG/ACT IN AERS
2.0000 | INHALATION_SPRAY | Freq: Once | RESPIRATORY_TRACT | Status: AC
  Administered 2014-01-20: 2 via RESPIRATORY_TRACT
  Filled 2014-01-20: qty 13.4

## 2014-01-20 NOTE — Discharge Instructions (Signed)
Read the information below.  Use the prescribed medication as directed.  Please discuss all new medications with your pharmacist.  You may return to the Emergency Department at any time for worsening condition or any new symptoms that concern you.   If there is any possibility that you might be pregnant, please let your health care provider know and discuss this with the pharmacist to ensure medication safety.    If you develop high fevers that do not resolve with tylenol or ibuprofen, you have difficulty swallowing or breathing, or you are unable to tolerate fluids by mouth, return to the ER for a recheck.      Cough, Adult  A cough is a reflex that helps clear your throat and airways. It can help heal the body or may be a reaction to an irritated airway. A cough may only last 2 or 3 weeks (acute) or may last more than 8 weeks (chronic).  CAUSES Acute cough:  Viral or bacterial infections. Chronic cough:  Infections.  Allergies.  Asthma.  Post-nasal drip.  Smoking.  Heartburn or acid reflux.  Some medicines.  Chronic lung problems (COPD).  Cancer. SYMPTOMS   Cough.  Fever.  Chest pain.  Increased breathing rate.  High-pitched whistling sound when breathing (wheezing).  Colored mucus that you cough up (sputum). TREATMENT   A bacterial cough may be treated with antibiotic medicine.  A viral cough must run its course and will not respond to antibiotics.  Your caregiver may recommend other treatments if you have a chronic cough. HOME CARE INSTRUCTIONS   Only take over-the-counter or prescription medicines for pain, discomfort, or fever as directed by your caregiver. Use cough suppressants only as directed by your caregiver.  Use a cold steam vaporizer or humidifier in your bedroom or home to help loosen secretions.  Sleep in a semi-upright position if your cough is worse at night.  Rest as needed.  Stop smoking if you smoke. SEEK IMMEDIATE MEDICAL CARE IF:    You have pus in your sputum.  Your cough starts to worsen.  You cannot control your cough with suppressants and are losing sleep.  You begin coughing up blood.  You have difficulty breathing.  You develop pain which is getting worse or is uncontrolled with medicine.  You have a fever. MAKE SURE YOU:   Understand these instructions.  Will watch your condition.  Will get help right away if you are not doing well or get worse. Document Released: 09/05/2010 Document Revised: 06/01/2011 Document Reviewed: 09/05/2010 [facility] Patient Information 2015 [place], [place]. This information is not intended to replace advice given to you by your health care provider. Make sure you discuss any questions you have with your health care provider.  Viral Infections A virus is a type of germ. Viruses can cause:  Minor sore throats.  Aches and pains.  Headaches.  Runny nose.  Rashes.  Watery eyes.  Tiredness.  Coughs.  Loss of appetite.  Feeling sick to your stomach (nausea).  Throwing up (vomiting).  Watery poop (diarrhea). HOME CARE   Only take medicines as told by your doctor.  Drink enough water and fluids to keep your pee (urine) clear or pale yellow. Sports drinks are a good choice.  Get plenty of rest and eat healthy. Soups and broths with crackers or rice are fine. GET HELP RIGHT AWAY IF:   You have a very bad headache.  You have shortness of breath.  You have chest pain or neck pain.  You have an unusual rash.  You cannot stop throwing up.  You have watery poop that does not stop.  You cannot keep fluids down.  You or your child has a temperature by mouth above 102 F (38.9 C), not controlled by medicine.  Your baby is older than 3 months with a rectal temperature of 102 F (38.9 C) or higher.  Your baby is 18 months old or younger with a rectal temperature of 100.4 F (38 C) or higher. MAKE SURE YOU:   Understand these instructions.  Will  watch this condition.  Will get help right away if you are not doing well or get worse. Document Released: 02/20/2008 Document Revised: 06/01/2011 Document Reviewed: 07/15/2010 [facility] Patient Information 2015 [place], [place]. This information is not intended to replace advice given to you by your health care provider. Make sure you discuss any questions you have with your health care provider.

## 2014-01-20 NOTE — ED Provider Notes (Signed)
Medical screening examination/treatment/procedure(s) were performed by non-physician practitioner and as supervising physician I was immediately available for consultation/collaboration.   EKG Interpretation None       Kalman Drape, MD 01/20/14 [phone]

## 2014-01-20 NOTE — ED Provider Notes (Signed)
CSN: [id number]     Arrival date & time 01/19/14  2310 History   First MD Initiated Contact with Patient 01/20/14 0009     Chief Complaint  Patient presents with  . Cough     (Consider location/radiation/quality/duration/timing/severity/associated sxs/prior Treatment) The history is provided by the patient.    Pt presents with 4 days of cough.  States boyfriend sneezed in her face and the next day she developed sneezing and cough, fever to 102.  Has been taking tylenol cold and flu, honey, tea, mints, and one dose of flagyl (because she was scared she had an "infection").  Fever has resolved but cough is worse.  Cough initially productive of yellow/green sputum, dry since yesterday.  Pains in sides and head with coughing.  Denies SOB, leg swelling, sore throat.    Past Medical History  Diagnosis Date  . Carpal tunnel syndrome 03-2011    BILATERAL RECEIVES INJECTIONS  . Oligo-ovulation    Past Surgical History  Procedure Laterality Date  . Breast surgery  2009    left breast bx-benign   Family History  Problem Relation Age of Onset  . Breast cancer Mother     2005 AND 2013  . Diabetes Father   . Diabetes Maternal Grandfather   . Breast cancer Paternal Grandmother   . Breast cancer Paternal Grandfather    History  Substance Use Topics  . Smoking status: Current Every Day Smoker -- 0.50 packs/day for 9 years    Types: Cigarettes  . Smokeless tobacco: Never Used  . Alcohol Use: No   OB History   Grav Para Term Preterm Abortions TAB SAB Ect Mult Living   2    2  2         Review of Systems  All other systems reviewed and are negative.     Allergies  Review of patient's allergies indicates no known allergies.  Home Medications   Prior to Admission medications   Medication Sig Start Date End Date Taking? Authorizing Provider  acetaminophen (TYLENOL) 500 MG tablet Take 500 mg by mouth every 6 (six) hours as needed for mild pain.    Historical Provider, MD   metroNIDAZOLE (FLAGYL) 500 MG tablet Take 1 tablet (500 mg total) by mouth 2 (two) times daily. 11/04/13   Deno Etienne, MD  naproxen sodium (ANAPROX) 220 MG tablet Take 220 mg by mouth daily as needed. For pain    Historical Provider, MD   BP 156/101  Pulse 94  Temp(Src) 98.7 F (37.1 C)  Resp 18  Wt 252 lb 5 oz (114.448 kg)  SpO2 98%  LMP 01/10/2014 Physical Exam  Nursing note and vitals reviewed. Constitutional: She appears well-developed and well-nourished. No distress.  HENT:  Head: Normocephalic and atraumatic.  Eyes: Conjunctivae are normal.  Neck: Normal range of motion. Neck supple.  Cardiovascular: Normal rate and regular rhythm.   Pulmonary/Chest: Effort normal and breath sounds normal. No respiratory distress. She has no wheezes. She has no rales.  Neurological: She is alert.  Skin: She is not diaphoretic.    ED Course  Procedures (including critical care time) Labs Review Labs Reviewed - No data to display  Imaging Review Dg Chest 2 View  01/20/2014   CLINICAL DATA:  Nonproductive cough for 1 week, worsening chest and rib pain.  EXAM: CHEST  2 VIEW  COMPARISON:  Chest radiograph January 30, 2014  FINDINGS: Cardiomediastinal silhouette is unremarkable. The lungs are clear without pleural effusions or focal consolidations. Trachea projects midline  and there is no pneumothorax. Soft tissue planes and included osseous structures are non-suspicious.  IMPRESSION: No acute cardiopulmonary process.   Electronically Signed   By: Elon Alas   On: 01/20/2014 01:03     EKG Interpretation None      MDM   Final diagnoses:  Cough    Afebrile, nontoxic patient with constellation of symptoms suggestive of viral syndrome.  No concerning findings on exam.  Discharged home with supportive care- Albuterol tussionex, PCP follow up.  Discussed result, findings, treatment, and follow up  with patient.  Pt given return precautions.  Pt verbalizes understanding and agrees with  plan.          [place], PA-C 01/20/14 [phone]

## 2014-01-22 ENCOUNTER — Encounter (HOSPITAL_COMMUNITY): Payer: Self-pay | Admitting: Emergency Medicine

## 2014-01-30 ENCOUNTER — Emergency Department (HOSPITAL_COMMUNITY): Payer: No Typology Code available for payment source

## 2014-01-30 ENCOUNTER — Encounter (HOSPITAL_COMMUNITY): Payer: Self-pay | Admitting: *Deleted

## 2014-01-30 ENCOUNTER — Emergency Department (HOSPITAL_COMMUNITY)
Admission: EM | Admit: 2014-01-30 | Discharge: 2014-01-30 | Disposition: A | Discharge status: Home / Self Care | Payer: No Typology Code available for payment source | Attending: Emergency Medicine | Admitting: Emergency Medicine

## 2014-01-30 DIAGNOSIS — Z791 Long term (current) use of non-steroidal anti-inflammatories (NSAID): Secondary | ICD-10-CM | POA: Insufficient documentation

## 2014-01-30 DIAGNOSIS — J40 Bronchitis, not specified as acute or chronic: Secondary | ICD-10-CM

## 2014-01-30 DIAGNOSIS — R1110 Vomiting, unspecified: Secondary | ICD-10-CM | POA: Insufficient documentation

## 2014-01-30 DIAGNOSIS — H9209 Otalgia, unspecified ear: Secondary | ICD-10-CM | POA: Insufficient documentation

## 2014-01-30 DIAGNOSIS — R05 Cough: Secondary | ICD-10-CM | POA: Diagnosis present

## 2014-01-30 DIAGNOSIS — Z8742 Personal history of other diseases of the female genital tract: Secondary | ICD-10-CM | POA: Diagnosis not present

## 2014-01-30 DIAGNOSIS — R599 Enlarged lymph nodes, unspecified: Secondary | ICD-10-CM | POA: Diagnosis not present

## 2014-01-30 DIAGNOSIS — J209 Acute bronchitis, unspecified: Secondary | ICD-10-CM | POA: Diagnosis not present

## 2014-01-30 DIAGNOSIS — R059 Cough, unspecified: Secondary | ICD-10-CM

## 2014-01-30 DIAGNOSIS — Z720 Tobacco use: Secondary | ICD-10-CM | POA: Diagnosis not present

## 2014-01-30 DIAGNOSIS — Z8669 Personal history of other diseases of the nervous system and sense organs: Secondary | ICD-10-CM | POA: Insufficient documentation

## 2014-01-30 DIAGNOSIS — K5900 Constipation, unspecified: Secondary | ICD-10-CM | POA: Diagnosis not present

## 2014-01-30 MED ORDER — AZITHROMYCIN 250 MG PO TABS: 250.0000 mg | ORAL_TABLET | Freq: Every day | ORAL | Status: DC

## 2014-01-30 MED ORDER — KETOROLAC TROMETHAMINE 30 MG/ML IJ SOLN
30.0000 mg | Freq: Once | INTRAMUSCULAR | Status: AC
  Administered 2014-01-30: 30 mg via INTRAMUSCULAR
  Filled 2014-01-30: qty 1

## 2014-01-30 MED ORDER — NAPROXEN 500 MG PO TABS: 500.0000 mg | ORAL_TABLET | Freq: Two times a day (BID) | ORAL | Status: DC

## 2014-01-30 MED ORDER — GUAIFENESIN 100 MG/5ML PO LIQD: 100.0000 mg | ORAL | Status: DC | PRN

## 2014-01-30 MED ORDER — PREDNISONE 20 MG PO TABS: ORAL_TABLET | ORAL | Status: AC

## 2014-01-30 MED ORDER — ALBUTEROL SULFATE HFA 108 (90 BASE) MCG/ACT IN AERS: 1.0000 | INHALATION_SPRAY | Freq: Four times a day (QID) | RESPIRATORY_TRACT | Status: DC | PRN

## 2014-01-30 NOTE — ED Provider Notes (Signed)
CSN: [id number]     Arrival date & time 01/30/14  1630 History  This chart was scribed for non-physician practitioner, Starlyn Skeans, PA-C, working with [place], DO, by Delphia Grates, ED Scribe. This patient was seen in room TR09C/TR09C and the patient's care was started at 7:08 PM.    Chief Complaint  Patient presents with  . Cough  . Pleurisy     The history is provided by the patient. No language interpreter was used.     HPI Comments: Krystal Edwards is a 28 y.o. female who presents to the Emergency Department complaining of persistent productive cough for the past 1.5 weeks. Patient states she was seen here on January 20, 2014 for the same and was prescibed Tussinex without relief. There is associated CP and otalgia with cough, SOB, vomiting (2 days ago), constipation, and wheezing that is worse at night. In addition to Tussinex, she has taken Aleve, Mucinex DM without significant improvement. Patient reports fever on October 31st, but states this has resolved. She denies rhinorrhea, sore throat. No sick contacts. She denies history of asthma, COPD, DM or HTN. Patient is a current PPD smoker. NKA to any medications. Last tetanus received in 2003.   Past Medical History  Diagnosis Date  . Carpal tunnel syndrome 03-2011    BILATERAL RECEIVES INJECTIONS  . Oligo-ovulation    Past Surgical History  Procedure Laterality Date  . Breast surgery  2009    left breast bx-benign   Family History  Problem Relation Age of Onset  . Breast cancer Mother     2005 AND 2013  . Diabetes Father   . Diabetes Maternal Grandfather   . Breast cancer Paternal Grandmother   . Breast cancer Paternal Grandfather    History  Substance Use Topics  . Smoking status: Current Every Day Smoker -- 0.50 packs/day for 9 years    Types: Cigarettes  . Smokeless tobacco: Never Used  . Alcohol Use: No   OB History    Gravida Para Term Preterm AB TAB SAB Ectopic Multiple Living   2    2  2          Review of Systems  Constitutional: Positive for fever (resolved). Negative for chills.  HENT: Positive for ear pain (with cough).   Respiratory: Positive for cough, shortness of breath and wheezing.   Cardiovascular: Positive for chest pain (with cough).  Gastrointestinal: Positive for vomiting and constipation.  All other systems reviewed and are negative.     Allergies  Review of patient's allergies indicates no known allergies.  Home Medications   Prior to Admission medications   Medication Sig Start Date End Date Taking? Authorizing Provider  acetaminophen (TYLENOL) 500 MG tablet Take 500 mg by mouth every 6 (six) hours as needed for mild pain.   Yes Historical Provider, MD  Naproxen Sodium (ALEVE) 220 MG CAPS Take 220 mg by mouth every 8 (eight) hours as needed (pain).   Yes Historical Provider, MD  chlorpheniramine-HYDROcodone (TUSSIONEX PENNKINETIC ER) 10-8 MG/5ML LQCR Take 5 mLs by mouth every 12 (twelve) hours as needed for cough. 01/20/14   Clayton Bibles, PA-C  naproxen sodium (ANAPROX) 220 MG tablet Take 220 mg by mouth daily as needed. For pain    Historical Provider, MD   Triage Vitals: BP 155/100 mmHg  Pulse 114  Temp(Src) 98.2 F (36.8 C) (Oral)  Resp 20  SpO2 97%  LMP 01/10/2014  Physical Exam  Constitutional: She is oriented to person, place, and  time. She appears well-developed and well-nourished. No distress.  HENT:  Head: Normocephalic and atraumatic.  Nose: Mucosal edema present.  Mouth/Throat: Oropharynx is clear and moist. No oropharyngeal exudate, posterior oropharyngeal edema or posterior oropharyngeal erythema.  Eyes: Conjunctivae and EOM are normal. Pupils are equal, round, and reactive to light.  Neck: No tracheal deviation present.  Cardiovascular: Normal rate, regular rhythm and normal heart sounds.   Pulmonary/Chest: Effort normal. No respiratory distress.  Musculoskeletal: Normal range of motion.  Lymphadenopathy:    She has cervical  adenopathy.  Neurological: She is alert and oriented to person, place, and time.  Skin: Skin is warm and dry.  Psychiatric: She has a normal mood and affect. Her behavior is normal.  Nursing note and vitals reviewed.   ED Course  Procedures (including critical care time)  DIAGNOSTIC STUDIES: Oxygen Saturation is 97% on room air, adequate by my interpretation.    COORDINATION OF CARE: At 1915 Discussed treatment plan with patient which includes CXR. If no signs of pneumonia, will prescribe ABX and inhaler. Patient agrees.   Labs Review Labs Reviewed - No data to display  Imaging Review No results found.   EKG Interpretation None      MDM   Final diagnoses:  Cough   Patient is a 28 y.o. Female who presents to the ED with cough x 2 weeks.  Physical exam reveals lungs that are clear to auscultation.  VSS stable.  CXR is negative.  Suspect that this is likely severe bronchitis will discharge the patient home with albuterol, prednisone, azithromycin, and robitussin.  Patient to follow-up at [facility].  Will also give prescription for naproxen BID.  Patient is stable for discharge.  Patient to return for worsening shortness of breath or any other concerning symptoms.  Patient states understanding and agreement at this time.    I personally performed the services described in this documentation, which was scribed in my presence. The recorded information has been reviewed and is accurate.     Cherylann Parr, PA-C 01/30/14 [place], DO 01/31/14 [id number]

## 2014-01-30 NOTE — ED Notes (Signed)
Unresolved chest cold - productive cough: yellow; pleurisy. Taking tussinex for pain relief. Last dose was on 01/29/2014

## 2014-01-30 NOTE — Discharge Instructions (Signed)
Acute Bronchitis °Bronchitis is inflammation of the airways that extend from the windpipe into the lungs (bronchi). The inflammation often causes mucus to develop. This leads to a cough, which is the most common symptom of bronchitis.  °In acute bronchitis, the condition usually develops suddenly and goes away over time, usually in a couple weeks. Smoking, allergies, and asthma can make bronchitis worse. Repeated episodes of bronchitis may cause further lung problems.  °CAUSES °Acute bronchitis is most often caused by the same virus that causes a cold. The virus can spread from person to person (contagious) through coughing, sneezing, and touching contaminated objects. °SIGNS AND SYMPTOMS  °· Cough.   °· Fever.   °· Coughing up mucus.   °· Body aches.   °· Chest congestion.   °· Chills.   °· Shortness of breath.   °· Sore throat.   °DIAGNOSIS  °Acute bronchitis is usually diagnosed through a physical exam. Your health care provider will also ask you questions about your medical history. Tests, such as chest X-rays, are sometimes done to rule out other conditions.  °TREATMENT  °Acute bronchitis usually goes away in a couple weeks. Oftentimes, no medical treatment is necessary. Medicines are sometimes given for relief of fever or cough. Antibiotic medicines are usually not needed but may be prescribed in certain situations. In some cases, an inhaler may be recommended to help reduce shortness of breath and control the cough. A cool mist vaporizer may also be used to help thin bronchial secretions and make it easier to clear the chest.  °HOME CARE INSTRUCTIONS °· Get plenty of rest.   °· Drink enough fluids to keep your urine clear or pale yellow (unless you have a medical condition that requires fluid restriction). Increasing fluids may help thin your respiratory secretions (sputum) and reduce chest congestion, and it will prevent dehydration.   °· Take medicines only as directed by your health care provider. °· If  you were prescribed an antibiotic medicine, finish it all even if you start to feel better. °· Avoid smoking and secondhand smoke. Exposure to cigarette smoke or irritating chemicals will make bronchitis worse. If you are a smoker, consider using nicotine gum or skin patches to help control withdrawal symptoms. Quitting smoking will help your lungs heal faster.   °· Reduce the chances of another bout of acute bronchitis by washing your hands frequently, avoiding people with cold symptoms, and trying not to touch your hands to your mouth, nose, or eyes.   °· Keep all follow-up visits as directed by your health care provider.   °SEEK MEDICAL CARE IF: °Your symptoms do not improve after 1 week of treatment.  °SEEK IMMEDIATE MEDICAL CARE IF: °· You develop an increased fever or chills.   °· You have chest pain.   °· You have severe shortness of breath. °· You have bloody sputum.   °· You develop dehydration. °· You faint or repeatedly feel like you are going to pass out. °· You develop repeated vomiting. °· You develop a severe headache. °MAKE SURE YOU:  °· Understand these instructions. °· Will watch your condition. °· Will get help right away if you are not doing well or get worse. °Document Released: 04/16/2004 Document Revised: 07/24/2013 Document Reviewed: 08/30/2012 °ExitCare® Patient Information ©2015 [employer]. This information is not intended to replace advice given to you by your health care provider. Make sure you discuss any questions you have with your health care provider. ° ° °Emergency Department Resource Guide °1) Find a Doctor and Pay Out of Pocket °Although you won't have to find out who is   covered by your insurance plan, it is a good idea to ask around and get recommendations. You will then need to call the office and see if the doctor you have chosen will accept you as a new patient and what types of options they offer for patients who are self-pay. Some doctors offer discounts or will set up  payment plans for their patients who do not have insurance, but you will need to ask so you aren't surprised when you get to your appointment. ° °2) Contact Your Local Health Department °Not all health departments have doctors that can see patients for sick visits, but many do, so it is worth a call to see if yours does. If you don't know where your local health department is, you can check in your phone book. The CDC also has a tool to help you locate your state's health department, and many state websites also have listings of all of their local health departments. ° °3) Find a Walk-in Clinic °If your illness is not likely to be very severe or complicated, you may want to try a walk in clinic. These are popping up all over the country in pharmacies, drugstores, and shopping centers. They're usually staffed by nurse practitioners or physician assistants that have been trained to treat common illnesses and complaints. They're usually fairly quick and inexpensive. However, if you have serious medical issues or chronic medical problems, these are probably not your best option. ° °No Primary Care Doctor: °- Call [facility] at  832-8000 - they can help you locate a primary care doctor that  accepts your insurance, provides certain services, etc. °- Physician Referral Service- 1-800-533-3463 ° °Chronic Pain Problems: °Organization         Address  Phone   Notes  °[facility]  (336) 297-2271 Patients need to be referred by their primary care doctor.  ° °Medication Assistance: °Organization         Address  Phone   Notes  °[place] [facility] [address] °[place], [place] [address] (336) 641-8030 --Must be a resident of [place] °-- Must have NO insurance coverage whatsoever (no Medicaid/ Medicare, etc.) °-- The pt. MUST have a primary care doctor that directs their care regularly and follows them in the community °  °[facility]  (866) 331-1348   °[employer]  (888) 892-1162   ° °Agencies that provide inexpensive medical care: °Organization         Address  Phone   Notes  °[facility]  (336) 832-8035   °[facility]    (336) 832-7272   °[facility] [address] °[place], [place] [address] (336) 832-4777   °[facility] [address], °[place] (336) 271-4999   °[facility]    (336) 373-0678   °[facility]    (336) 272-1050   °[facility] ° [address], [place] Phone:  (336) 832-4444, Fax:  (336) 832-4440 Hours of Operation:  9 am - 6 pm, M-F.  Also accepts Medicaid/Medicare and self-pay.  °[facility] ° [address], [place] Phone: (336) 832-3150, Fax: (336) [phone]. Hours of Operation:  8:30 am - 5:30 pm, M-F.  Also accepts Medicaid and self-pay.  °[facility] [place] [address], [place] Phone: (336) 878-6027   °[facility] [address], [place], [place] (336)723-1848, Ext.   123 Mondays & Thursdays: 7-9 AM.  First 15 patients are seen on a first come, first serve basis. °  ° °Medicaid-accepting [place] Providers: ° °Organization         Address  Phone   Notes  °[facility] [address], [place] (336) 641-2100 Also accepts self-pay patients.  °[facility] [address], [place] ° (336) 856-9996   °[facility] [address], [place] (336) 288-8857   °[facility] [address] [place] [address], [place] (336) 299-7000   °Veita Bland [address], [place]  ° (336) 373-1557 Only accepts [place] Access Medicaid patients after they have their name applied to their card.  ° °Self-Pay (no insurance) in [place]: ° °Organization         Address  Phone   Notes  °Sickle Cell Patients, [facility] [address], [place]  (336) 832-1970   °[facility] [address], [place] (336) 832-4400   °[facility] ° [address], [place] (336) 992-4800   °[facility]/Dr. Osei-Bonsu ° [address] [place] [address], [place] or [address], [place] (336) [phone] Phone number for both [place] and [place] locations is the same.  °[facility] [address], [place] (336) 299-0000   °[facility] [address] [place] [address], [place] or [address] (336) 852-7530 °(336) 878-2260   °[facility] [address], [place] (336) 350-1642, phone; (336) 294-5005, fax Sees patients 1st and 3rd Saturday of every month.  Must not qualify for public or private insurance (i.e. Medicaid, Medicare, [place] Health Choice, Veterans' Benefits) • Household income should be no more than 200% of the poverty level •The clinic cannot treat you if you are pregnant or think you are pregnant • Sexually transmitted diseases are not treated at the clinic.  ° ° °Dental Care: °Organization         Address  Phone  Notes  °[facility] [facility] [address], [place] (336) 641-6152 Accepts children up to age 21 who are enrolled in Medicaid or [place] Health Choice; pregnant women with a Medicaid card; and children who have applied for Medicaid or [place] Health Choice, but were declined, whose parents can pay a reduced fee at time of service.  °[facility] [place]  [address], [place] (336) 641-7733 Accepts children up to age 21 who are enrolled in Medicaid or [place] Health Choice; pregnant women with a Medicaid card; and children who have applied for Medicaid or  Health Choice, but were declined, whose parents can pay a reduced fee at time of service.  °[facility] ° [address], [place] (336) 641-4533 Patients  are seen by appointment only. Walk-ins are not accepted. [facility] will see patients 18 years of age and older. °Monday - Tuesday (8am-5pm) °Most Wednesdays (8:30-5pm) °$30 per visit, cash only  °[facility] ° [address], [place] (336) 641-4533 Patients are seen by appointment only. Walk-ins are not accepted. [facility] will see patients 18 years of age and older. °One Wednesday Evening (Monthly: Volunteer Based).  $30 per visit, cash only  °[facility]  (919) [phone] for   adults; Children under age 4, call Graduate Pediatric Dentistry at ([phone]) [phone]. Children aged 4-14, please call (919) [phone] to request a pediatric application. ° Dental services are provided in all areas of dental care including fillings, crowns and bridges, complete and partial dentures, implants, gum treatment, root canals, and extractions. Preventive care is also provided. Treatment is provided to both adults and children. °Patients are selected via a lottery and there is often a waiting list. °  °[facility] [address], °[place] ° (336) 763-8833 [email or web address] °  °[facility] [address], [place], [place] (336)723-1848, Ext. 123 Second and Fourth Thursday of each month, opens at 6:30 AM; Clinic ends at 9 AM.  Patients are seen on a first-come first-served basis, and a limited number are seen during each clinic.  ° °[facility] ° [address], [place], [place] (336) 723-7904   Eligibility Requirements °You must have lived in [place], [place], or [place] counties for at least the last three months. °  You cannot be eligible for state or federal sponsored healthcare insurance, including Veterans Administration, Medicaid, or Medicare. °  You generally cannot be eligible for healthcare insurance through your employer.  °  How to apply: °Eligibility screenings are held every Tuesday and Wednesday afternoon from 1:00 pm until  4:00 pm. You do not need an appointment for the interview!  °[facility] [address], [place], [place] 336-631-2330   °[facility]  336-342-8273   °[facility]  336-703-3100   °[facility]  336-570-6415   ° °Behavioral Health Resources in the Community: °Intensive Outpatient Programs °Organization         Address  Phone  Notes  °[facility] [address], [place], [place] 336-878-6098   °[facility] [address], [place], [place] 336-832-9800   °[facility] [address], [place], [place] ° 336-882-2125   °[facility] [address],  °[place], [place] 1-800-853-5163 or 336-641-4981   °Substance Abuse Resources °Organization         Address  Phone  Notes  °[facility]  336-882-2125   °[facility]  336-784-9470   °[facility]  336-285-9073   °[facility]  336-845-3988   °Residential & Outpatient Substance Abuse Program  1-800-659-3381   °Psychological Services °Organization         Address  Phone  Notes  °[facility]  336- 832-9600   °[facility]  336- 378-7881   °[facility] [address], [place] 1-800-853-5163 or 336-641-4981   ° °Mobile Crisis Teams °Organization         Address  Phone  Notes  °[facility], Mobile Crisis Care Unit  1-877-626-1772   °Assertive °[facility] ° [address] [place], [place] 336-834-9664   °Sharon DeEsch [address] °[place] 336-554-5454   ° °Self-Help/Support Groups °Organization         Address  Phone             Notes  °[facility] [place] [facility] - variety of support groups  336- 373-1402 Call for more information  °Narcotics Anonymous (NA), [facility] [address], °[place]   2 meetings at this location  ° °Residential Treatment Programs °Organization          Address  Phone  Notes  °[facility] [address],    °[place]  1-866-801-8205   °[facility] ° [address], [place], [place] 704-293-8524   °[facility] [address], [place] 336-845-3988 Admissions: 8am-3pm M-F  °[facility] [address]    °[place], [place] 336-841-1104   °[facility] [address], [place], [place] 336-379-7146   °[facility] [address]  °[place], [place] 336-285-9073   °[facility] - Intensive Outpatient [address], [place], [place] 336-852-3033   °[facility] [address]  °[place], [place] 1-877-615-2722 or 336-784-9470   °[facility] [address] [place], [place] 336-227-7417 Accepts Medicaid  °[facility] [address]  °[place] 1-800-659-3381 Substance Abuse/Addiction Treatment  ° °[place] Behavioral Health Resources °Organization         Address  Phone  Notes  °[facility]  (888) 581-9988   °Julie Brannon, PhD [address] [place], [place]   (336) 349-5553 or (336) 951-0000   °[facility]   [address] °[place], [place] (336) 349-4454   °[facility] [address], [place], [place] (336) 342-8316 Insurance/Medicaid/sponsorship through [facility]  °[facility] [address]                                    [place], [place] (336) 342-8316 Therapy/tele-psych/case  °[facility] [address]  ° [place], [place] (336) 349-2233    °Dr. Arfeen  (336) 349-4544   °[facility] [place]  [employer] [place] [facility] 1) [address], [place] °2) [address], [place] °3)  [address], [place] (336) 349-3220 °(336) 342-7768 ° °(336) 342-8140   °[place] [facility] (336) 342-1394 or (336) 342-3537 (After Hours)    ° ° ° °

## 2014-01-30 NOTE — ED Notes (Signed)
Pt. Refused wheelchair 

## 2014-02-12 ENCOUNTER — Ambulatory Visit (INDEPENDENT_AMBULATORY_CARE_PROVIDER_SITE_OTHER): Payer: No Typology Code available for payment source | Admitting: Physician Assistant

## 2014-02-12 VITALS — BP 140/82 | HR 90 | Temp 98.1°F | Resp 16 | Ht 67.75 in | Wt 251.0 lb

## 2014-02-12 DIAGNOSIS — R05 Cough: Secondary | ICD-10-CM

## 2014-02-12 DIAGNOSIS — J040 Acute laryngitis: Secondary | ICD-10-CM

## 2014-02-12 DIAGNOSIS — R059 Cough, unspecified: Secondary | ICD-10-CM

## 2014-02-12 MED ORDER — HYDROCODONE-HOMATROPINE 5-1.5 MG/5ML PO SYRP: 5.0000 mL | ORAL_SOLUTION | Freq: Three times a day (TID) | ORAL | Status: DC | PRN

## 2014-02-12 MED ORDER — PREDNISONE 20 MG PO TABS
ORAL_TABLET | ORAL | Status: DC
Start: 2014-02-12 — End: 2015-04-01

## 2014-02-12 MED ORDER — ALBUTEROL SULFATE (2.5 MG/3ML) 0.083% IN NEBU
2.5000 mg | INHALATION_SOLUTION | Freq: Once | RESPIRATORY_TRACT | Status: AC
  Administered 2014-02-12: 2.5 mg via RESPIRATORY_TRACT

## 2014-02-12 MED ORDER — ALBUTEROL SULFATE HFA 108 (90 BASE) MCG/ACT IN AERS: 2.0000 | INHALATION_SPRAY | RESPIRATORY_TRACT | Status: DC | PRN

## 2014-02-12 MED ORDER — IPRATROPIUM BROMIDE 0.02 % IN SOLN
0.5000 mg | Freq: Once | RESPIRATORY_TRACT | Status: AC
  Administered 2014-02-12: 0.5 mg via RESPIRATORY_TRACT

## 2014-02-12 NOTE — Progress Notes (Signed)
Subjective:    Patient ID: Krystal Edwards, female    DOB: 14-Sep-1985, 28 y.o.   MRN: [id number]   PCP: Default, Provider, MD  Chief Complaint  Patient presents with  . Cough  . Laryngitis  . Emesis    No Known Allergies  There are no active problems to display for this patient.   Prior to Admission medications   Medication Sig Start Date End Date Taking? Authorizing Provider  acetaminophen (TYLENOL) 500 MG tablet Take 500 mg by mouth every 6 (six) hours as needed for mild pain.   Yes Historical Provider, MD  guaiFENesin (ROBITUSSIN) 100 MG/5ML liquid Take 5-10 mLs (100-200 mg total) by mouth every 4 (four) hours as needed for cough. 01/30/14  Yes Courtney A Forcucci, PA-C  Naproxen Sodium (ALEVE) 220 MG CAPS Take 220 mg by mouth every 8 (eight) hours as needed (pain).   Yes Historical Provider, MD    Medical, Surgical, Family and Social History reviewed and updated.  HPI  This 28 y.o. female presents for evaluation of cough and laryngitis.  Symptoms began about a month ago.  She presented to the ED with cough on 01/20/2014. She'd had symptoms for 4 days, beginning after her boyfriend sneezed in her face. She had fever as high as 102, which resolved. Cough was initially productive of yellow/green sputum was then non-productive, but caused HA, pain in the ribs, and post-tussive vomiting. CXR was negative. She was diagnosed with a viral URI and prescribed supportive care, albuterol inhaler and Tussionex.  She experienced no improvement with treatment and returned to the ED on 01/30/2014. She reported continued symptoms, now with otalgia, SOB, constipation and wheezing.  Repeat CXR was also normal. She was diagnosed with sever bronchitis and prescribed azithromycin, prednisone (20 mg, 2 tabs daily x 3 days), albuterol inhaler and Robitussin cough syrup.  Today she reports no benefit from any treatment so far.  She has completed all medications prescribed, and is now using OTC  Aleve and OTC Robitussin.  Deep breathing, talking and eating trigger coughing, and she coughs to gagging and emesis. Cough produces clear, thick fluid (originally it was yellow). Headache and back pain with coughing. Emesis now occurs every time she coughs. 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 Unable to sleep at night due to the cough.  Review of Systems As above.    Objective:   Physical Exam  Constitutional: She is oriented to person, place, and time. Vital signs are normal. She appears well-developed and well-nourished. She is active and cooperative. No distress.  BP 140/82 mmHg  Pulse 90  Temp(Src) 98.1 F (36.7 C) (Oral)  Resp 16  Ht 5' 7.75" (1.721 m)  Wt 251 lb (113.853 kg)  BMI 38.44 kg/m2  SpO2 99%  LMP 02/04/2014  HENT:  Head: Normocephalic and atraumatic.  Right Ear: Hearing normal.  Left Ear: Hearing normal.  Eyes: Conjunctivae are normal. No scleral icterus.  Neck: Normal range of motion. Neck supple. No thyromegaly present.  Cardiovascular: Normal rate, regular rhythm and normal heart sounds.   Pulses:      Radial pulses are 2+ on the right side, and 2+ on the left side.  Pulmonary/Chest: Effort normal and breath sounds normal.  Coughs and gags with every deep breath. No adventitious sounds appreciated. Lings sounds remain normal after albuterol + Atrovent neb treatment, and she reports reduced need to cough.  Lymphadenopathy:       Head (right side): No tonsillar,  no preauricular, no posterior auricular and no occipital adenopathy present.       Head (left side): No tonsillar, no preauricular, no posterior auricular and no occipital adenopathy present.    She has no cervical adenopathy.       Right: No supraclavicular adenopathy present.       Left: No supraclavicular adenopathy present.  Neurological: She is alert and oriented to person, place, and time. No sensory deficit.  Skin: Skin is warm, dry and intact.  No rash noted. No cyanosis or erythema. Nails show no clubbing.  Psychiatric: She has a normal mood and affect.  Vitals reviewed.         Assessment & Plan:  1. Cough 2. Laryngitis Concern for prolonged course and severity of symptoms. While she had some improvement with nebulized albuterol and Atrovent, she did not find the home use inhaler beneficial. Consider LPR component to her symptoms. Refer to ENT. May also need pulmonology eval.  - albuterol (PROVENTIL) (2.5 MG/3ML) 0.083% nebulizer solution 2.5 mg; Take 3 mLs (2.5 mg total) by nebulization once. - ipratropium (ATROVENT) nebulizer solution 0.5 mg; Take 2.5 mLs (0.5 mg total) by nebulization once. - albuterol (PROVENTIL HFA;VENTOLIN HFA) 108 (90 BASE) MCG/ACT inhaler; Inhale 2 puffs into the lungs every 4 (four) hours as needed for wheezing or shortness of breath (cough, shortness of breath or wheezing.).  Dispense: 1 Inhaler; Refill: 1 - predniSONE (DELTASONE) 20 MG tablet; Take 3 PO QAM x3days, 2 PO QAM x3days, 1 PO QAM x3days  Dispense: 18 tablet; Refill: 0 - HYDROcodone-homatropine (HYCODAN) 5-1.5 MG/5ML syrup; Take 5 mLs by mouth every 8 (eight) hours as needed for cough.  Dispense: 150 mL; Refill: 0 - Ambulatory referral to ENT  Recheck in 1 week, sooner if needed.  Fara Chute, PA-C Physician Assistant-Certified [facility]

## 2014-02-12 NOTE — Patient Instructions (Signed)
Get as much rest as you can, and drink at least 64 ounces of water daily.

## 2014-02-26 ENCOUNTER — Encounter: Payer: Self-pay | Admitting: Physician Assistant

## 2014-02-26 DIAGNOSIS — R499 Unspecified voice and resonance disorder: Secondary | ICD-10-CM | POA: Insufficient documentation

## 2014-02-26 DIAGNOSIS — R059 Cough, unspecified: Secondary | ICD-10-CM | POA: Insufficient documentation

## 2014-02-26 DIAGNOSIS — K219 Gastro-esophageal reflux disease without esophagitis: Secondary | ICD-10-CM | POA: Insufficient documentation

## 2014-02-26 DIAGNOSIS — R05 Cough: Secondary | ICD-10-CM | POA: Insufficient documentation

## 2015-04-01 ENCOUNTER — Emergency Department (HOSPITAL_COMMUNITY): Payer: Managed Care, Other (non HMO)

## 2015-04-01 ENCOUNTER — Encounter (HOSPITAL_COMMUNITY): Payer: Self-pay | Admitting: Family Medicine

## 2015-04-01 ENCOUNTER — Emergency Department (HOSPITAL_COMMUNITY)
Admission: EM | Admit: 2015-04-01 | Discharge: 2015-04-02 | Disposition: A | Discharge status: Home / Self Care | Payer: Managed Care, Other (non HMO) | Attending: Emergency Medicine | Admitting: Emergency Medicine

## 2015-04-01 DIAGNOSIS — Z8742 Personal history of other diseases of the female genital tract: Secondary | ICD-10-CM | POA: Diagnosis not present

## 2015-04-01 DIAGNOSIS — R05 Cough: Secondary | ICD-10-CM | POA: Diagnosis present

## 2015-04-01 DIAGNOSIS — J069 Acute upper respiratory infection, unspecified: Secondary | ICD-10-CM | POA: Diagnosis not present

## 2015-04-01 DIAGNOSIS — R091 Pleurisy: Secondary | ICD-10-CM

## 2015-04-01 DIAGNOSIS — Z79899 Other long term (current) drug therapy: Secondary | ICD-10-CM | POA: Insufficient documentation

## 2015-04-01 DIAGNOSIS — Z8669 Personal history of other diseases of the nervous system and sense organs: Secondary | ICD-10-CM | POA: Diagnosis not present

## 2015-04-01 DIAGNOSIS — F17210 Nicotine dependence, cigarettes, uncomplicated: Secondary | ICD-10-CM | POA: Diagnosis not present

## 2015-04-01 LAB — CBC
HEMATOCRIT: 38.5 % (ref 36.0–46.0)
Hemoglobin: 12.7 g/dL (ref 12.0–15.0)
MCH: 27.9 pg (ref 26.0–34.0)
MCHC: 33 g/dL (ref 30.0–36.0)
MCV: 84.6 fL (ref 78.0–100.0)
Platelets: 338 10*3/uL (ref 150–400)
RBC: 4.55 MIL/uL (ref 3.87–5.11)
RDW: 12.9 % (ref 11.5–15.5)
WBC: 12.9 10*3/uL — ABNORMAL HIGH (ref 4.0–10.5)

## 2015-04-01 LAB — I-STAT TROPONIN, ED: Troponin i, poc: 0.01 ng/mL (ref 0.00–0.08)

## 2015-04-01 LAB — BASIC METABOLIC PANEL
Anion gap: 13 (ref 5–15)
BUN: 8 mg/dL (ref 6–20)
CHLORIDE: 101 mmol/L (ref 101–111)
CO2: 20 mmol/L — ABNORMAL LOW (ref 22–32)
Calcium: 9.3 mg/dL (ref 8.9–10.3)
Creatinine, Ser: 0.86 mg/dL (ref 0.44–1.00)
GFR calc Af Amer: >60 mL/min (ref >60)
GFR calc non Af Amer: >60 mL/min (ref >60)
Glucose, Bld: 125 mg/dL — ABNORMAL HIGH (ref 65–99)
POTASSIUM: 3.9 mmol/L (ref 3.5–5.1)
Sodium: 134 mmol/L — ABNORMAL LOW (ref 135–145)

## 2015-04-01 LAB — D-DIMER, QUANTITATIVE: D-Dimer, Quant: 0.89 ug/mL-FEU — ABNORMAL HIGH (ref 0.00–0.50)

## 2015-04-01 MED ORDER — NAPROXEN 500 MG PO TABS: 500.0000 mg | ORAL_TABLET | Freq: Two times a day (BID) | ORAL | Status: DC

## 2015-04-01 MED ORDER — BENZONATATE 100 MG PO CAPS: 100.0000 mg | ORAL_CAPSULE | Freq: Three times a day (TID) | ORAL | Status: DC

## 2015-04-01 MED ORDER — HYDROCODONE-ACETAMINOPHEN 5-325 MG PO TABS
1.0000 | ORAL_TABLET | ORAL | Status: AC
  Administered 2015-04-01: 1 via ORAL
  Filled 2015-04-01: qty 1

## 2015-04-01 MED ORDER — IOHEXOL 350 MG/ML SOLN
100.0000 mL | Freq: Once | INTRAVENOUS | Status: AC | PRN
  Administered 2015-04-01: 70 mL via INTRAVENOUS

## 2015-04-01 NOTE — ED Provider Notes (Signed)
CSN: [id number]     Arrival date & time 04/01/15  1801 History   First MD Initiated Contact with Patient 04/01/15 2024     Chief Complaint  Patient presents with  . Cough  . Chest Pain    Patient is a 30 y.o. female presenting with cough and chest pain. The history is provided by the patient.  Cough Cough characteristics:  Productive Sputum characteristics:  Clear Severity:  Moderate Onset quality:  Gradual Duration:  1 week Timing:  Constant Progression:  Worsening Associated symptoms: chest pain   Chest Pain Pain location:  L chest Pain quality: sharp   Pain radiates to:  Upper back Pain radiates to the back: no   Associated symptoms: cough     Past Medical History  Diagnosis Date  . Carpal tunnel syndrome 03-2011    BILATERAL RECEIVES INJECTIONS  . Oligo-ovulation    Past Surgical History  Procedure Laterality Date  . Breast surgery  2009    left breast bx-benign   Family History  Problem Relation Age of Onset  . Breast cancer Mother 58    2005 AND 2013  . Diabetes Father   . Diabetes Maternal Grandfather   . Breast cancer Paternal Grandmother   . Breast cancer Paternal Grandfather    Social History  Substance Use Topics  . Smoking status: Current Every Day Smoker -- 0.50 packs/day for 9 years    Types: Cigarettes    Last Attempt to Quit: 01/20/2014  . Smokeless tobacco: Never Used  . Alcohol Use: No   OB History    Gravida Para Term Preterm AB TAB SAB Ectopic Multiple Living   2    2  2         Review of Systems  Respiratory: Positive for cough.   Cardiovascular: Positive for chest pain.      Allergies  Review of patient's allergies indicates no known allergies.  Home Medications   Prior to Admission medications   Medication Sig Start Date End Date Taking? Authorizing Provider  albuterol (PROVENTIL HFA;VENTOLIN HFA) 108 (90 BASE) MCG/ACT inhaler Inhale 2 puffs into the lungs every 4 (four) hours as needed for wheezing or shortness of breath  (cough, shortness of breath or wheezing.). 02/12/14  Yes Chelle Jeffery, PA-C  dextromethorphan-guaiFENesin (MUCINEX DM) 30-600 MG 12hr tablet Take 1 tablet by mouth 2 (two) times daily.   Yes Historical Provider, MD  acetaminophen (TYLENOL) 500 MG tablet Take 500 mg by mouth every 6 (six) hours as needed for mild pain.    Historical Provider, MD  benzonatate (TESSALON) 100 MG capsule Take 1 capsule (100 mg total) by mouth every 8 (eight) hours. 04/01/15   Dorie Rank, MD  naproxen (NAPROSYN) 500 MG tablet Take 1 tablet (500 mg total) by mouth 2 (two) times daily. 04/01/15   Dorie Rank, MD   BP 128/80 mmHg  Pulse 106  Temp(Src) 98.6 F (37 C) (Oral)  Resp 23  Ht 5\' 8"  (1.727 m)  Wt 119.931 kg  BMI 40.21 kg/m2  SpO2 99% Physical Exam  Constitutional: She appears well-developed and well-nourished. No distress.  HENT:  Head: Normocephalic and atraumatic.  Right Ear: External ear normal.  Left Ear: External ear normal.  Eyes: Conjunctivae are normal. Right eye exhibits no discharge. Left eye exhibits no discharge. No scleral icterus.  Neck: Neck supple. No tracheal deviation present.  Cardiovascular: Normal rate, regular rhythm and intact distal pulses.   Pulmonary/Chest: Effort normal and breath sounds normal. No stridor. No respiratory  distress. She has no wheezes. She has no rales.  Abdominal: Soft. Bowel sounds are normal. She exhibits no distension. There is no tenderness. There is no rebound and no guarding.  Musculoskeletal: She exhibits no edema or tenderness.  Neurological: She is alert. She has normal strength. No cranial nerve deficit (no facial droop, extraocular movements intact, no slurred speech) or sensory deficit. She exhibits normal muscle tone. She displays no seizure activity. Coordination normal.  Skin: Skin is warm and dry. No rash noted.  Psychiatric: She has a normal mood and affect.  Nursing note and vitals reviewed.   ED Course  Procedures (including critical care  time) Labs Review Labs Reviewed  BASIC METABOLIC PANEL - Abnormal; Notable for the following:    Sodium 134 (*)    CO2 20 (*)    Glucose, Bld 125 (*)    All other components within normal limits  CBC - Abnormal; Notable for the following:    WBC 12.9 (*)    All other components within normal limits  D-DIMER, QUANTITATIVE (NOT AT [facility]) - Abnormal; Notable for the following:    D-Dimer, Quant 0.89 (*)    All other components within normal limits  Randolm Idol, ED    Imaging Review Dg Chest 2 View  04/01/2015  CLINICAL DATA:  Shortness of breath. EXAM: CHEST  2 VIEW COMPARISON:  01/30/2014 and 01/20/2014. FINDINGS: The heart size and mediastinal contours are normal. The lungs are clear. There is no pleural effusion or pneumothorax. No acute osseous findings are identified. IMPRESSION: Stable chest.  No active cardiopulmonary process. Electronically Signed   By: Richardean Sale M.D.   On: 04/01/2015 19:30   Ct Angio Chest Pe W/cm &/or Wo Cm  04/01/2015  CLINICAL DATA:  30 year old female with chest pressure and shortness of breath EXAM: CT ANGIOGRAPHY CHEST WITH CONTRAST TECHNIQUE: Multidetector CT imaging of the chest was performed using the standard protocol during bolus administration of intravenous contrast. Multiplanar CT image reconstructions and MIPs were obtained to evaluate the vascular anatomy. CONTRAST:  28mL OMNIPAQUE IOHEXOL 350 MG/ML SOLN COMPARISON:  Radiograph dated 04/01/2015 FINDINGS: The lungs are clear. No pleural effusion or pneumothorax. The central airways are patent. The thoracic aorta appears unremarkable. No CT evidence of pulmonary embolism. Top-normal cardiac size. No pericardial effusion. There is no hilar or mediastinal adenopathy. Nodular density in the anterior mediastinum likely represent residual thymic tissue The esophagus and the thyroid gland are grossly unremarkable. There is no axillary adenopathy. The chest wall soft tissues appear unremarkable. The osseous  structures are intact. The visualized upper abdomen is grossly unremarkable. Review of the MIP images confirms the above findings. IMPRESSION: No CT evidence of pulmonary embolism. Electronically Signed   By: Anner Crete M.D.   On: 04/01/2015 23:22   I have personally reviewed and evaluated these images and lab results as part of my medical decision-making.   EKG Interpretation   Date/Time:  Monday April 01 2015 18:03:18 EST Ventricular Rate:  118 PR Interval:  122 QRS Duration: 86 QT Interval:  326 QTC Calculation: 456 R Axis:   76 Text Interpretation:  Sinus tachycardia Otherwise normal ECG Since last  tracing rate faster Confirmed by Coby Shrewsberry  MD-J, Germaine Shenker [id number]) on 04/01/2015  8:31:19 PM      MDM   Final diagnoses:  URI, acute  Pleurisy    Pt has had uri sx and then developed severe chest pain and shortness of breath this evening.  Initial EKG showed a sinus tach.  PERC positive.  D Dimer elevated.  CT angio does not show any PE or other acute abnormality.  Suspect her pain is related to pleurisy from a viral illness.  Pt mentions having had similar episodes in the past when she is stressed.  Possible panic attack component.  At this time there does not appear to be any evidence of an acute emergency medical condition and the patient appears stable for discharge with appropriate outpatient follow up.    Dorie Rank, MD 04/02/15 [phone]

## 2015-04-01 NOTE — ED Notes (Signed)
Pt given coke and crackers --  

## 2015-04-01 NOTE — Discharge Instructions (Signed)
Pleurisy  Pleurisy is an inflammation and swelling of the lining of the lungs (pleura). Because of this inflammation, it hurts to breathe. It can be aggravated by coughing, laughing, or deep breathing. Pleurisy is often caused by an underlying infection or disease.   HOME CARE INSTRUCTIONS   Monitor your pleurisy for any changes. The following actions may help to alleviate any discomfort you are experiencing:  · Medicine may help with pain. Only take over-the-counter or prescription medicines for pain, discomfort, or fever as directed by your health care provider.  · Only take antibiotic medicine as directed. Make sure to finish it even if you start to feel better.  SEEK MEDICAL CARE IF:   · Your pain is not controlled with medicine or is increasing.  · You have an increase in pus-like (purulent) secretions brought up with coughing.  SEEK IMMEDIATE MEDICAL CARE IF:   · You have blue or dark lips, fingernails, or toenails.  · You are coughing up blood.  · You have increased difficulty breathing.  · You have continuing pain unrelieved by medicine or pain lasting more than 1 week.  · You have pain that radiates into your neck, arms, or jaw.  · You develop increased shortness of breath or wheezing.  · You develop a fever, rash, vomiting, fainting, or other serious symptoms.  MAKE SURE YOU:  · Understand these instructions.    · Will watch your condition.    · Will get help right away if you are not doing well or get worse.        This information is not intended to replace advice given to you by your health care provider. Make sure you discuss any questions you have with your health care provider.     Document Released: 03/09/2005 Document Revised: 11/09/2012 Document Reviewed: 08/21/2012  Elsevier Interactive Patient Education ©2016 [employer]

## 2015-04-01 NOTE — ED Notes (Signed)
Pt transported to CT ?

## 2015-04-01 NOTE — ED Notes (Signed)
IV attempted x2 without success.

## 2015-04-01 NOTE — ED Notes (Signed)
Pt here for chest pain, SOB and cough since yesterday. sts sharp and radiates into back. Pt tachy at 130 in triage. Pt crying. sts worse with breathing.

## 2015-04-01 NOTE — ED Notes (Signed)
Iv team at bedside  

## 2015-04-01 NOTE — ED Notes (Signed)
Pt c/o cough and congestion x 2 weeks, unrelieved with OTC medications. Reports sharp chest pains onset yesterday, worse with inspiration and when coughing.

## 2015-04-09 ENCOUNTER — Ambulatory Visit: Payer: Managed Care, Other (non HMO)

## 2015-04-10 ENCOUNTER — Emergency Department (HOSPITAL_BASED_OUTPATIENT_CLINIC_OR_DEPARTMENT_OTHER): Payer: Managed Care, Other (non HMO)

## 2015-04-10 ENCOUNTER — Encounter (HOSPITAL_BASED_OUTPATIENT_CLINIC_OR_DEPARTMENT_OTHER): Payer: Self-pay | Admitting: Emergency Medicine

## 2015-04-10 ENCOUNTER — Emergency Department (HOSPITAL_BASED_OUTPATIENT_CLINIC_OR_DEPARTMENT_OTHER)
Admission: EM | Admit: 2015-04-10 | Discharge: 2015-04-10 | Disposition: A | Discharge status: Home / Self Care | Payer: Managed Care, Other (non HMO) | Attending: Emergency Medicine | Admitting: Emergency Medicine

## 2015-04-10 DIAGNOSIS — Z8742 Personal history of other diseases of the female genital tract: Secondary | ICD-10-CM | POA: Diagnosis not present

## 2015-04-10 DIAGNOSIS — O9989 Other specified diseases and conditions complicating pregnancy, childbirth and the puerperium: Secondary | ICD-10-CM | POA: Diagnosis present

## 2015-04-10 DIAGNOSIS — F17210 Nicotine dependence, cigarettes, uncomplicated: Secondary | ICD-10-CM | POA: Diagnosis not present

## 2015-04-10 DIAGNOSIS — Z3A01 Less than 8 weeks gestation of pregnancy: Secondary | ICD-10-CM | POA: Insufficient documentation

## 2015-04-10 DIAGNOSIS — O99281 Endocrine, nutritional and metabolic diseases complicating pregnancy, first trimester: Secondary | ICD-10-CM | POA: Insufficient documentation

## 2015-04-10 DIAGNOSIS — R102 Pelvic and perineal pain: Secondary | ICD-10-CM

## 2015-04-10 DIAGNOSIS — O99331 Smoking (tobacco) complicating pregnancy, first trimester: Secondary | ICD-10-CM | POA: Insufficient documentation

## 2015-04-10 DIAGNOSIS — R1084 Generalized abdominal pain: Secondary | ICD-10-CM | POA: Insufficient documentation

## 2015-04-10 DIAGNOSIS — R110 Nausea: Secondary | ICD-10-CM | POA: Insufficient documentation

## 2015-04-10 DIAGNOSIS — E669 Obesity, unspecified: Secondary | ICD-10-CM | POA: Diagnosis not present

## 2015-04-10 DIAGNOSIS — Z8669 Personal history of other diseases of the nervous system and sense organs: Secondary | ICD-10-CM | POA: Diagnosis not present

## 2015-04-10 DIAGNOSIS — Z3201 Encounter for pregnancy test, result positive: Secondary | ICD-10-CM

## 2015-04-10 DIAGNOSIS — Z3401 Encounter for supervision of normal first pregnancy, first trimester: Secondary | ICD-10-CM

## 2015-04-10 LAB — URINALYSIS, ROUTINE W REFLEX MICROSCOPIC
BILIRUBIN URINE: NEGATIVE
Glucose, UA: NEGATIVE mg/dL
Hgb urine dipstick: NEGATIVE
Ketones, ur: NEGATIVE mg/dL
Leukocytes, UA: NEGATIVE
NITRITE: NEGATIVE
PH: 8 (ref 5.0–8.0)
Protein, ur: NEGATIVE mg/dL
SPECIFIC GRAVITY, URINE: 1.023 (ref 1.005–1.030)

## 2015-04-10 LAB — PREGNANCY, URINE: Preg Test, Ur: POSITIVE — AB

## 2015-04-10 LAB — HCG, QUANTITATIVE, PREGNANCY: hCG, Beta Chain, Quant, S: 411 m[IU]/mL — ABNORMAL HIGH (ref <5)

## 2015-04-10 MED ORDER — DOXYLAMINE-PYRIDOXINE 10-10 MG PO TBEC: DELAYED_RELEASE_TABLET | ORAL | Status: DC

## 2015-04-10 NOTE — ED Notes (Signed)
Pt recently got over the flu, has been having lower back pain and sensitivity in breasts along with nausea/vomititng.  Pt here to be checked for pregnancy.

## 2015-04-10 NOTE — ED Provider Notes (Signed)
CSN: [id number]     Arrival date & time 04/10/15  1609 History   First MD Initiated Contact with Patient 04/10/15 1628     Chief Complaint  Patient presents with  . Back Pain  . Possible Pregnancy   HPI  Krystal Edwards is an 30 y.o. G2P0 female with history of PCOS who presents to the ED for evaluation of possible pregnancy. She states that she started noticing bilateral breast tenderness, back pain, and fatigue last week. She reports associated intermittent nausea with dry heaving but denies emesis. She also reports intermittent lower abdominal cramps. Denies any pain currently. She denies vaginal discharge or bleeding. Pt states she has PCOS and has very irregular periods, LMP 12/2014. States she is monogamously sexually active with one partner and they do not use protection so she is not sure exactly when she could have become pregnant. States she had one positive and one negative home pregnancy test. Denies dysuria, urinary frequency/urgency, gross hematuria. States she had URI/flu-like illness the week before symptom onset but those symptoms have resolved.  Past Medical History  Diagnosis Date  . Carpal tunnel syndrome 03-2011    BILATERAL RECEIVES INJECTIONS  . Oligo-ovulation    Past Surgical History  Procedure Laterality Date  . Breast surgery  2009    left breast bx-benign   Family History  Problem Relation Age of Onset  . Breast cancer Mother 73    2005 AND 2013  . Diabetes Father   . Diabetes Maternal Grandfather   . Breast cancer Paternal Grandmother   . Breast cancer Paternal Grandfather    Social History  Substance Use Topics  . Smoking status: Current Every Day Smoker -- 0.50 packs/day for 9 years    Types: Cigarettes    Last Attempt to Quit: 01/20/2014  . Smokeless tobacco: Never Used  . Alcohol Use: No   OB History    Gravida Para Term Preterm AB TAB SAB Ectopic Multiple Living   2    2  2         Review of Systems  All other systems reviewed and are  negative.     Allergies  Review of patient's allergies indicates no known allergies.  Home Medications   Prior to Admission medications   Not on File   BP 167/75 mmHg  Pulse 92  Temp(Src) 98.7 F (37.1 C) (Oral)  Resp 16  SpO2 100%  LMP 01/08/2015 Physical Exam  Constitutional: She is oriented to person, place, and time.  Obese, NAD  HENT:  Right Ear: External ear normal.  Left Ear: External ear normal.  Nose: Nose normal.  Mouth/Throat: Oropharynx is clear and moist. No oropharyngeal exudate.  Eyes: Conjunctivae and EOM are normal. Pupils are equal, round, and reactive to light.  Neck: Normal range of motion. Neck supple.  Cardiovascular: Normal rate, regular rhythm, normal heart sounds and intact distal pulses.   Pulmonary/Chest: Effort normal and breath sounds normal. No respiratory distress. She exhibits no tenderness.  Abdominal: Soft. Bowel sounds are normal. She exhibits no distension. There is no rebound, no guarding and no CVA tenderness.  Mild generalized low abdominal tenderness  Musculoskeletal: She exhibits no edema.  Neurological: She is alert and oriented to person, place, and time. No cranial nerve deficit.  Skin: Skin is warm and dry.  Psychiatric: She has a normal mood and affect.  Nursing note and vitals reviewed.   ED Course  Procedures (including critical care time) Labs Review Labs Reviewed  PREGNANCY, URINE -  Abnormal; Notable for the following:    Preg Test, Ur POSITIVE (*)    All other components within normal limits  URINALYSIS, ROUTINE W REFLEX MICROSCOPIC (NOT AT [facility])    Imaging Review US Ob Comp Less 14 Wks  04/10/2015  CLINICAL DATA:  Breast tenderness and adnexal pain. EXAM: OBSTETRIC <14 WK [place] AND TRANSVAGINAL OB US TECHNIQUE: Both transabdominal and transvaginal ultrasound examinations were performed for complete evaluation of the gestation as well as the maternal uterus, adnexal regions, and pelvic cul-de-sac. Transvaginal  technique was performed to assess early pregnancy. COMPARISON:  None. FINDINGS: Intrauterine gestational sac: Questionable hypoechoic structure in the uterine fundus. An additional anechoic structure is seen low within the lower uterine segment or cervix. Yolk sac:  None. Embryo:  None. Cardiac Activity: None. MSD: 3.7  mm   5 w   1  d            [place] EDC: 12/10/2015 Subchorionic hemorrhage:  None. Maternal uterus/adnexae: Possible corpus luteum cyst on the right ovary. Left ovary is unremarkable. No free fluid. IMPRESSION: 1. Questionable hypoechoic gestational sac in the uterine fundus. 2. Second anechoic structure in the inferior lower uterine segment versus cervix, likely nabothian cysts. Abnormal gestational sac not excluded. Electronically Signed   By: Lorin Picket M.D.   On: 04/10/2015 17:48   US Ob Transvaginal  04/10/2015  CLINICAL DATA:  Breast tenderness and adnexal pain. EXAM: OBSTETRIC <14 WK [place] AND TRANSVAGINAL OB US TECHNIQUE: Both transabdominal and transvaginal ultrasound examinations were performed for complete evaluation of the gestation as well as the maternal uterus, adnexal regions, and pelvic cul-de-sac. Transvaginal technique was performed to assess early pregnancy. COMPARISON:  None. FINDINGS: Intrauterine gestational sac: Questionable hypoechoic structure in the uterine fundus. An additional anechoic structure is seen low within the lower uterine segment or cervix. Yolk sac:  None. Embryo:  None. Cardiac Activity: None. MSD: 3.7  mm   5 w   1  d            [place] EDC: 12/10/2015 Subchorionic hemorrhage:  None. Maternal uterus/adnexae: Possible corpus luteum cyst on the right ovary. Left ovary is unremarkable. No free fluid. IMPRESSION: 1. Questionable hypoechoic gestational sac in the uterine fundus. 2. Second anechoic structure in the inferior lower uterine segment versus cervix, likely nabothian cysts. Abnormal gestational sac not excluded. Electronically Signed   By: Lorin Picket M.D.    On: 04/10/2015 17:48   I have personally reviewed and evaluated these images and lab results as part of my medical decision-making.   EKG Interpretation None      MDM   Final diagnoses:  Pregnancy, first, first trimester  Nausea    Urine preg positive. Will obtain hcg. Given intermittent abdominal cramping will obtain OB US to r/o ectopic given abd cramping and tenderness.  US shows questionable hypoechoic gestational sac in uterine fundus and another structure near lower uterine segment vs cervix. At this time would consider this inconclusive [place] and cannot r/o ectopic pregnancy. Attending MD spoke with Dr. Glo Herring for OB who recommends 48h hcg quant f/u at [facility]. Informed pt to go to [facility] on fri or sat morning for quant hcg. She verbalized understanding. Otherwise rx given for diclegis. ER return precautions given.   Anne Ng, PA-C 04/10/15 2222  Ezequiel Essex, MD 04/11/15 [phone]

## 2015-04-10 NOTE — Discharge Instructions (Signed)
Please go to MAU at [facility] on Friday or Saturday at [place] to get a repeat level of your pregnancy hormone. Return to the ER before then for new or worsening symptoms.   First Trimester of Pregnancy The first trimester of pregnancy is from week 1 until the end of week 12 (months 1 through 3). A week after a sperm fertilizes an egg, the egg will implant on the wall of the uterus. This embryo will begin to develop into a baby. Genes from you and your partner are forming the baby. The female genes determine whether the baby is a boy or a girl. At 6-8 weeks, the eyes and face are formed, and the heartbeat can be seen on ultrasound. At the end of 12 weeks, all the baby's organs are formed.  Now that you are pregnant, you will want to do everything you can to have a healthy baby. Two of the most important things are to get good prenatal care and to follow your health care provider's instructions. Prenatal care is all the medical care you receive before the baby's birth. This care will help prevent, find, and treat any problems during the pregnancy and childbirth. BODY CHANGES Your body goes through many changes during pregnancy. The changes vary from woman to woman.   You may gain or lose a couple of pounds at first.  You may feel sick to your stomach (nauseous) and throw up (vomit). If the vomiting is uncontrollable, call your health care provider.  You may tire easily.  You may develop headaches that can be relieved by medicines approved by your health care provider.  You may urinate more often. Painful urination may mean you have a bladder infection.  You may develop heartburn as a result of your pregnancy.  You may develop constipation because certain hormones are causing the muscles that push waste through your intestines to slow down.  You may develop hemorrhoids or swollen, bulging veins (varicose veins).  Your breasts may begin to grow larger and become tender. Your nipples may stick  out more, and the tissue that surrounds them (areola) may become darker.  Your gums may bleed and may be sensitive to brushing and flossing.  Dark spots or blotches (chloasma, mask of pregnancy) may develop on your face. This will likely fade after the baby is born.  Your menstrual periods will stop.  You may have a loss of appetite.  You may develop cravings for certain kinds of food.  You may have changes in your emotions from day to day, such as being excited to be pregnant or being concerned that something may go wrong with the pregnancy and baby.  You may have more vivid and strange dreams.  You may have changes in your hair. These can include thickening of your hair, rapid growth, and changes in texture. Some women also have hair loss during or after pregnancy, or hair that feels dry or thin. Your hair will most likely return to normal after your baby is born. WHAT TO EXPECT AT YOUR PRENATAL VISITS During a routine prenatal visit:  You will be weighed to make sure you and the baby are growing normally.  Your blood pressure will be taken.  Your abdomen will be measured to track your baby's growth.  The fetal heartbeat will be listened to starting around week 10 or 12 of your pregnancy.  Test results from any previous visits will be discussed. Your health care provider may ask you:  How you are  feeling.  If you are feeling the baby move.  If you have had any abnormal symptoms, such as leaking fluid, bleeding, severe headaches, or abdominal cramping.  If you are using any tobacco products, including cigarettes, chewing tobacco, and electronic cigarettes.  If you have any questions. Other tests that may be performed during your first trimester include:  Blood tests to find your blood type and to check for the presence of any previous infections. They will also be used to check for low iron levels (anemia) and Rh antibodies. Later in the pregnancy, blood tests for diabetes  will be done along with other tests if problems develop.  Urine tests to check for infections, diabetes, or protein in the urine.  An ultrasound to confirm the proper growth and development of the baby.  An amniocentesis to check for possible genetic problems.  Fetal screens for spina bifida and Down syndrome.  You may need other tests to make sure you and the baby are doing well.  HIV (human immunodeficiency virus) testing. Routine prenatal testing includes screening for HIV, unless you choose not to have this test. HOME CARE INSTRUCTIONS  Medicines  Follow your health care provider's instructions regarding medicine use. Specific medicines may be either safe or unsafe to take during pregnancy.  Take your prenatal vitamins as directed.  If you develop constipation, try taking a stool softener if your health care provider approves. Diet  Eat regular, well-balanced meals. Choose a variety of foods, such as meat or vegetable-based protein, fish, milk and low-fat dairy products, vegetables, fruits, and whole grain breads and cereals. Your health care provider will help you determine the amount of weight gain that is right for you.  Avoid raw meat and uncooked cheese. These carry germs that can cause birth defects in the baby.  Eating four or five small meals rather than three large meals a day may help relieve nausea and vomiting. If you start to feel nauseous, eating a few soda crackers can be helpful. Drinking liquids between meals instead of during meals also seems to help nausea and vomiting.  If you develop constipation, eat more high-fiber foods, such as fresh vegetables or fruit and whole grains. Drink enough fluids to keep your urine clear or pale yellow. Activity and Exercise  Exercise only as directed by your health care provider. Exercising will help you:  Control your weight.  Stay in shape.  Be prepared for labor and delivery.  Experiencing pain or cramping in the  lower abdomen or low back is a good sign that you should stop exercising. Check with your health care provider before continuing normal exercises.  Try to avoid standing for long periods of time. Move your legs often if you must stand in one place for a long time.  Avoid heavy lifting.  Wear low-heeled shoes, and practice good posture.  You may continue to have sex unless your health care provider directs you otherwise. Relief of Pain or Discomfort  Wear a good support bra for breast tenderness.   Take warm sitz baths to soothe any pain or discomfort caused by hemorrhoids. Use hemorrhoid cream if your health care provider approves.   Rest with your legs elevated if you have leg cramps or low back pain.  If you develop varicose veins in your legs, wear support hose. Elevate your feet for 15 minutes, 3-4 times a day. Limit salt in your diet. Prenatal Care  Schedule your prenatal visits by the twelfth week of pregnancy. They are usually  scheduled monthly at first, then more often in the last 2 months before delivery.  Write down your questions. Take them to your prenatal visits.  Keep all your prenatal visits as directed by your health care provider. Safety  Wear your seat belt at all times when driving.  Make a list of emergency phone numbers, including numbers for family, friends, the hospital, and police and fire departments. General Tips  Ask your health care provider for a referral to a local prenatal education class. Begin classes no later than at the beginning of month 6 of your pregnancy.  Ask for help if you have counseling or nutritional needs during pregnancy. Your health care provider can offer advice or refer you to specialists for help with various needs.  Do not use hot tubs, steam rooms, or saunas.  Do not douche or use tampons or scented sanitary pads.  Do not cross your legs for long periods of time.  Avoid cat litter boxes and soil used by cats. These carry  germs that can cause birth defects in the baby and possibly loss of the fetus by miscarriage or stillbirth.  Avoid all smoking, herbs, alcohol, and medicines not prescribed by your health care provider. Chemicals in these affect the formation and growth of the baby.  Do not use any tobacco products, including cigarettes, chewing tobacco, and electronic cigarettes. If you need help quitting, ask your health care provider. You may receive counseling support and other resources to help you quit.  Schedule a dentist appointment. At home, brush your teeth with a soft toothbrush and be gentle when you floss. SEEK MEDICAL CARE IF:   You have dizziness.  You have mild pelvic cramps, pelvic pressure, or nagging pain in the abdominal area.  You have persistent nausea, vomiting, or diarrhea.  You have a bad smelling vaginal discharge.  You have pain with urination.  You notice increased swelling in your face, hands, legs, or ankles. SEEK IMMEDIATE MEDICAL CARE IF:   You have a fever.  You are leaking fluid from your vagina.  You have spotting or bleeding from your vagina.  You have severe abdominal cramping or pain.  You have rapid weight gain or loss.  You vomit blood or material that looks like coffee grounds.  You are exposed to [place] measles and have never had them.  You are exposed to fifth disease or chickenpox.  You develop a severe headache.  You have shortness of breath.  You have any kind of trauma, such as from a fall or a car accident.   This information is not intended to replace advice given to you by your health care provider. Make sure you discuss any questions you have with your health care provider.   Document Released: 03/03/2001 Document Revised: 03/30/2014 Document Reviewed: 01/17/2013 Elsevier Interactive Patient Education [employer].

## 2015-04-10 NOTE — ED Notes (Signed)
PA at bedside.

## 2015-04-15 ENCOUNTER — Telehealth: Payer: Self-pay

## 2015-04-15 NOTE — Telephone Encounter (Signed)
Received call from nurse line concerning patient wanting to be seen here for new pregnancy. According to the notes on 04/10/2015 from [facility] ED  patient was supposed to follow up with MAU IN 48hrs for HCG levels. If patient calls back please scheduled her for HCG level.

## 2015-04-16 NOTE — Telephone Encounter (Signed)
Pt will returned to office on 04/17/2015

## 2015-04-17 ENCOUNTER — Other Ambulatory Visit: Payer: Managed Care, Other (non HMO)

## 2015-04-17 DIAGNOSIS — O3680X0 Pregnancy with inconclusive fetal viability, not applicable or unspecified: Secondary | ICD-10-CM

## 2015-04-17 NOTE — Progress Notes (Unsigned)
Pt in for repeat beta was supposed to be done in 48 hours but has now been 1 week. Pt denies bleeding or pain. Advised patient that if she develops any of these symptoms to come to MAU for evaluation. Patient voiced understanding and will await results.

## 2015-04-18 LAB — HCG, QUANTITATIVE, PREGNANCY: hCG, Beta Chain, Quant, S: 5233.6 m[IU]/mL — ABNORMAL HIGH

## 2015-04-19 ENCOUNTER — Telehealth: Payer: Self-pay | Admitting: *Deleted

## 2015-04-19 ENCOUNTER — Other Ambulatory Visit: Payer: Self-pay | Admitting: Obstetrics and Gynecology

## 2015-04-19 ENCOUNTER — Telehealth: Payer: Self-pay

## 2015-04-19 DIAGNOSIS — Z3490 Encounter for supervision of normal pregnancy, unspecified, unspecified trimester: Secondary | ICD-10-CM

## 2015-04-19 MED ORDER — BUTALBITAL-APAP-CAFFEINE 50-325-40 MG PO TABS: 1.0000 | ORAL_TABLET | Freq: Four times a day (QID) | ORAL | Status: DC | PRN

## 2015-04-19 NOTE — Telephone Encounter (Signed)
Pt called requesting test results.  Called pt and LM to return call to the clinics.  Looking at pt's chart, Thayer Headings, RN has already spoken to pt.

## 2015-04-19 NOTE — Progress Notes (Signed)
Patient informed nurse that she has been experiencing headaches without relief with tylenol for the past several days. Rx Fioricet provided

## 2015-04-19 NOTE — Telephone Encounter (Signed)
Called patient per Dr Lenoria Chime and informed her of bhcg results and need to schedule u/s for viability next week. Patient was agreeable. U/s scheduled for 04/26/15 @ 0800. Called patient back to inform her of appointment. She voiced understanding. Patient also c/o severe "migraine" headaches for the past 2 days. Asked what she could take. I told her that the only thing I could recommend at this time was otc Tylenol. Spoke with Dr Elly Modena who prescribed Fiorcet. Contacted patient again and informed her that a prescription would be waiting for her at the front desk whenever she could pick it up. Also recommended that she drink plenty of water in the meantime to stay hydrated. Patient voiced understanding

## 2015-04-26 ENCOUNTER — Ambulatory Visit (HOSPITAL_COMMUNITY)
Admission: RE | Admit: 2015-04-26 | Discharge: 2015-04-26 | Disposition: A | Discharge status: Home / Self Care | Payer: Managed Care, Other (non HMO) | Source: Ambulatory Visit | Attending: Obstetrics & Gynecology | Admitting: Obstetrics & Gynecology

## 2015-04-26 ENCOUNTER — Ambulatory Visit (INDEPENDENT_AMBULATORY_CARE_PROVIDER_SITE_OTHER): Payer: Managed Care, Other (non HMO) | Admitting: Obstetrics and Gynecology

## 2015-04-26 DIAGNOSIS — Z3481 Encounter for supervision of other normal pregnancy, first trimester: Secondary | ICD-10-CM

## 2015-04-26 DIAGNOSIS — Z36 Encounter for antenatal screening of mother: Secondary | ICD-10-CM | POA: Diagnosis not present

## 2015-04-26 DIAGNOSIS — Z3A01 Less than 8 weeks gestation of pregnancy: Secondary | ICD-10-CM | POA: Insufficient documentation

## 2015-04-26 DIAGNOSIS — Z3490 Encounter for supervision of normal pregnancy, unspecified, unspecified trimester: Secondary | ICD-10-CM

## 2015-04-26 DIAGNOSIS — E282 Polycystic ovarian syndrome: Secondary | ICD-10-CM | POA: Diagnosis not present

## 2015-04-26 NOTE — Progress Notes (Signed)
Patient ID: Krystal Edwards, female   DOB: January 18, 1986, 30 y.o.   MRN: [id number] Ultrasounds Results Note  SUBJECTIVE HPI:  Krystal Edwards is a 30 y.o. G2P0020 at Unknown by LMP who presents to the [facility] for followup ultrasound results. The patient denies abdominal pain or vaginal bleeding.  Upon review of the patient's records, patient was first seen in [facility] ED on 1/18 for pregnancy confirmation and some abdominal cramping.   BHCG on that day was 411.  Ultrasound showed no IUP, adnexal mass or free fluid.  Last seen in MAU on 1/25. BHCG was 5200.  Repeat ultrasound was performed earlier today.   Past Medical History  Diagnosis Date  . Carpal tunnel syndrome 03-2011    BILATERAL RECEIVES INJECTIONS  . Oligo-ovulation    Past Surgical History  Procedure Laterality Date  . Breast surgery  2009    left breast bx-benign   Social History   Social History  . Marital Status: Single    Spouse Name: n/a  . Number of Children: 0  . Years of Education: 14   Occupational History  . customer service rep    Social History Main Topics  . Smoking status: Current Every Day Smoker -- 0.50 packs/day for 9 years    Types: Cigarettes    Last Attempt to Quit: 01/20/2014  . Smokeless tobacco: Never Used  . Alcohol Use: No  . Drug Use: No  . Sexual Activity:    Partners: Male    Birth Control/ Protection: None   Other Topics Concern  . Not on file   Social History Narrative   Lives with father and brother. Mother lives with her sister, also in [place].   Current Outpatient Prescriptions on File Prior to Visit  Medication Sig Dispense Refill  . butalbital-acetaminophen-caffeine (FIORICET) 50-325-40 MG tablet Take 1-2 tablets by mouth every 6 (six) hours as needed for headache. 20 tablet 0  . Doxylamine-Pyridoxine (DICLEGIS) 10-10 MG TBEC Two tabs at bedtime on day 1 and 2; if symptoms persist, take 1 tablet in morning and 2 tablets at bedtime on day 3; if symptoms  persist, may increase to 1 tablet in morning, 1 tablet mid-afternoon, and 2 tablets at bedtime on day 4 30 tablet 0   No current facility-administered medications on file prior to visit.   No Known Allergies  I have reviewed patient's Past Medical Hx, Surgical Hx, Family Hx, Social Hx, medications and allergies.   Review of Systems Review of Systems  Constitutional: Negative for fever and chills.  Gastrointestinal: Negative for nausea, vomiting, abdominal pain, diarrhea and constipation.  Genitourinary: Negative for dysuria.  Musculoskeletal: Negative for back pain.  Neurological: Negative for dizziness and weakness.    Physical Exam  LMP 01/08/2015  GENERAL: Well-developed, well-nourished female in no acute distress.  HEENT: Normocephalic, atraumatic.   LUNGS: Effort normal ABDOMEN: soft, non-tender HEART: Regular rate  SKIN: Warm, dry and without erythema PSYCH: Normal mood and affect NEURO: Alert and oriented x 4  LAB RESULTS No results found for this or any previous visit (from the past 24 hour(s)).  IMAGING Dg Chest 2 View  04/01/2015  CLINICAL DATA:  Shortness of breath. EXAM: CHEST  2 VIEW COMPARISON:  01/30/2014 and 01/20/2014. FINDINGS: The heart size and mediastinal contours are normal. The lungs are clear. There is no pleural effusion or pneumothorax. No acute osseous findings are identified. IMPRESSION: Stable chest.  No active cardiopulmonary process. Electronically Signed   By: Richardean Sale  M.D.   On: 04/01/2015 19:30   Ct Angio Chest Pe W/cm &/or Wo Cm  04/01/2015  CLINICAL DATA:  30 year old female with chest pressure and shortness of breath EXAM: CT ANGIOGRAPHY CHEST WITH CONTRAST TECHNIQUE: Multidetector CT imaging of the chest was performed using the standard protocol during bolus administration of intravenous contrast. Multiplanar CT image reconstructions and MIPs were obtained to evaluate the vascular anatomy. CONTRAST:  79mL OMNIPAQUE IOHEXOL 350 MG/ML SOLN  COMPARISON:  Radiograph dated 04/01/2015 FINDINGS: The lungs are clear. No pleural effusion or pneumothorax. The central airways are patent. The thoracic aorta appears unremarkable. No CT evidence of pulmonary embolism. Top-normal cardiac size. No pericardial effusion. There is no hilar or mediastinal adenopathy. Nodular density in the anterior mediastinum likely represent residual thymic tissue The esophagus and the thyroid gland are grossly unremarkable. There is no axillary adenopathy. The chest wall soft tissues appear unremarkable. The osseous structures are intact. The visualized upper abdomen is grossly unremarkable. Review of the MIP images confirms the above findings. IMPRESSION: No CT evidence of pulmonary embolism. Electronically Signed   By: Anner Crete M.D.   On: 04/01/2015 23:22   US Ob Comp Less 14 Wks  04/10/2015  CLINICAL DATA:  Breast tenderness and adnexal pain. EXAM: OBSTETRIC <14 WK [place] AND TRANSVAGINAL OB US TECHNIQUE: Both transabdominal and transvaginal ultrasound examinations were performed for complete evaluation of the gestation as well as the maternal uterus, adnexal regions, and pelvic cul-de-sac. Transvaginal technique was performed to assess early pregnancy. COMPARISON:  None. FINDINGS: Intrauterine gestational sac: Questionable hypoechoic structure in the uterine fundus. An additional anechoic structure is seen low within the lower uterine segment or cervix. Yolk sac:  None. Embryo:  None. Cardiac Activity: None. MSD: 3.7  mm   5 w   1  d            [place] EDC: 12/10/2015 Subchorionic hemorrhage:  None. Maternal uterus/adnexae: Possible corpus luteum cyst on the right ovary. Left ovary is unremarkable. No free fluid. IMPRESSION: 1. Questionable hypoechoic gestational sac in the uterine fundus. 2. Second anechoic structure in the inferior lower uterine segment versus cervix, likely nabothian cysts. Abnormal gestational sac not excluded. Electronically Signed   By: Lorin Picket  M.D.   On: 04/10/2015 17:48   US Ob Transvaginal  04/26/2015  CLINICAL DATA:  30 year old G 2 P0 SAB1, LMP 01/08/2015 (however, patient has polycystic ovarian syndrome has irregular menstrual cycles). Quantitative beta HCG 411 on 04/10/2015 and 5,233 on 04/17/2015, pending today. EXAM: TRANSVAGINAL OB ULTRASOUND < 14 WEEKS TECHNIQUE: Transvaginal ultrasound was performed for complete evaluation of the gestation as well as the maternal uterus, adnexal regions, and pelvic cul-de-sac. COMPARISON:  None this gestation. Diagnostic pelvic ultrasound 09/17/2010. FINDINGS: Intrauterine gestational sac: Single, normal in appearance. Yolk sac:  Present. Embryo:  Present. Cardiac Activity: Present. Heart Rate: 110 bpm CRL:   6 mm   7 w 3 d                  [place] EDC: 12/10/2015. Subchorionic hemorrhage:  None visualized. Maternal uterus/adnexae: Both ovaries upper normal in size containing multiple peripheral follicular cysts as noted on prior diagnostic pelvic ultrasound. Left ovary measures approximately 3.2 x 1.8 x 2.0 cm. Right ovary measures approximately 3.4 x 2.8 x 2.9 cm. No adnexal masses or free pelvic fluid. IMPRESSION: 1. Single live intrauterine embryo with estimated gestational age [redacted] weeks 3 days by crown-rump length. [facility] 12/10/2015. 2. Normal sized ovaries containing peripheral cysts as  noted previously, indicating the known diagnosis of polycystic ovarian syndrome. 3. No adnexal masses or free pelvic fluid. Electronically Signed   By: Evangeline Dakin M.D.   On: 04/26/2015 08:31   US Ob Transvaginal  04/10/2015  CLINICAL DATA:  Breast tenderness and adnexal pain. EXAM: OBSTETRIC <14 WK [place] AND TRANSVAGINAL OB US TECHNIQUE: Both transabdominal and transvaginal ultrasound examinations were performed for complete evaluation of the gestation as well as the maternal uterus, adnexal regions, and pelvic cul-de-sac. Transvaginal technique was performed to assess early pregnancy. COMPARISON:  None. FINDINGS:  Intrauterine gestational sac: Questionable hypoechoic structure in the uterine fundus. An additional anechoic structure is seen low within the lower uterine segment or cervix. Yolk sac:  None. Embryo:  None. Cardiac Activity: None. MSD: 3.7  mm   5 w   1  d            [place] EDC: 12/10/2015 Subchorionic hemorrhage:  None. Maternal uterus/adnexae: Possible corpus luteum cyst on the right ovary. Left ovary is unremarkable. No free fluid. IMPRESSION: 1. Questionable hypoechoic gestational sac in the uterine fundus. 2. Second anechoic structure in the inferior lower uterine segment versus cervix, likely nabothian cysts. Abnormal gestational sac not excluded. Electronically Signed   By: Lorin Picket M.D.   On: 04/10/2015 17:48    ASSESSMENT 1. Pregnancy     PLAN Informed the patient of a normal pregnancy Discharge home in stable condition Patient advised to start taking prenatal vitamins Patient advised to start prenatal care with [facility] provider of choice as soon as possible  Mora Bellman, MD  04/26/2015  8:43 AM

## 2015-05-16 LAB — OB RESULTS CONSOLE HIV ANTIBODY (ROUTINE TESTING): HIV: NONREACTIVE

## 2015-05-16 LAB — OB RESULTS CONSOLE ABO/RH: RH TYPE: NEGATIVE

## 2015-05-16 LAB — OB RESULTS CONSOLE HEPATITIS B SURFACE ANTIGEN: HEP B S AG: NEGATIVE

## 2015-05-16 LAB — OB RESULTS CONSOLE ANTIBODY SCREEN: Antibody Screen: NEGATIVE

## 2015-05-16 LAB — OB RESULTS CONSOLE RPR: RPR: NONREACTIVE

## 2015-05-16 LAB — OB RESULTS CONSOLE RUBELLA ANTIBODY, IGM: Rubella: IMMUNE

## 2015-05-24 LAB — OB RESULTS CONSOLE GC/CHLAMYDIA
CHLAMYDIA, DNA PROBE: NEGATIVE
Gonorrhea: NEGATIVE

## 2015-05-29 ENCOUNTER — Inpatient Hospital Stay (HOSPITAL_COMMUNITY)
Admission: AD | Admit: 2015-05-29 | Discharge: 2015-05-29 | Disposition: A | Discharge status: Home / Self Care | Payer: Managed Care, Other (non HMO) | Source: Ambulatory Visit | Attending: Obstetrics and Gynecology | Admitting: Obstetrics and Gynecology

## 2015-05-29 ENCOUNTER — Encounter (HOSPITAL_COMMUNITY): Payer: Self-pay

## 2015-05-29 DIAGNOSIS — Z3A15 15 weeks gestation of pregnancy: Secondary | ICD-10-CM | POA: Diagnosis not present

## 2015-05-29 DIAGNOSIS — R51 Headache: Secondary | ICD-10-CM | POA: Insufficient documentation

## 2015-05-29 DIAGNOSIS — O26892 Other specified pregnancy related conditions, second trimester: Secondary | ICD-10-CM | POA: Diagnosis not present

## 2015-05-29 DIAGNOSIS — O26899 Other specified pregnancy related conditions, unspecified trimester: Secondary | ICD-10-CM

## 2015-05-29 DIAGNOSIS — O26891 Other specified pregnancy related conditions, first trimester: Secondary | ICD-10-CM

## 2015-05-29 DIAGNOSIS — O9989 Other specified diseases and conditions complicating pregnancy, childbirth and the puerperium: Secondary | ICD-10-CM | POA: Diagnosis not present

## 2015-05-29 DIAGNOSIS — R109 Unspecified abdominal pain: Secondary | ICD-10-CM | POA: Insufficient documentation

## 2015-05-29 LAB — CBC
HCT: 36.3 % (ref 36.0–46.0)
Hemoglobin: 12.5 g/dL (ref 12.0–15.0)
MCH: 28.7 pg (ref 26.0–34.0)
MCHC: 34.4 g/dL (ref 30.0–36.0)
MCV: 83.4 fL (ref 78.0–100.0)
PLATELETS: 279 10*3/uL (ref 150–400)
RBC: 4.35 MIL/uL (ref 3.87–5.11)
RDW: 13.4 % (ref 11.5–15.5)
WBC: 15.1 10*3/uL — AB (ref 4.0–10.5)

## 2015-05-29 LAB — URINALYSIS, ROUTINE W REFLEX MICROSCOPIC
BILIRUBIN URINE: NEGATIVE
Glucose, UA: NEGATIVE mg/dL
KETONES UR: 15 mg/dL — AB
Leukocytes, UA: NEGATIVE
NITRITE: NEGATIVE
PROTEIN: 100 mg/dL — AB
Specific Gravity, Urine: >1.03 — ABNORMAL HIGH (ref 1.005–1.030)
pH: 6 (ref 5.0–8.0)

## 2015-05-29 LAB — URINE MICROSCOPIC-ADD ON: RBC / HPF: NONE SEEN RBC/hpf (ref 0–5)

## 2015-05-29 MED ORDER — ACETAMINOPHEN 325 MG PO TABS
650.0000 mg | ORAL_TABLET | Freq: Once | ORAL | Status: AC
  Administered 2015-05-29: 650 mg via ORAL
  Filled 2015-05-29: qty 2

## 2015-05-29 MED ORDER — DEXTROSE 5 % IN LACTATED RINGERS IV BOLUS
1000.0000 mL | Freq: Once | INTRAVENOUS | Status: AC
  Administered 2015-05-29: 1000 mL via INTRAVENOUS

## 2015-05-29 NOTE — Discharge Instructions (Signed)
Abdominal Pain During Pregnancy Abdominal pain is common in pregnancy. Most of the time, it does not cause harm. There are many causes of abdominal pain. Some causes are more serious than others. Some of the causes of abdominal pain in pregnancy are easily diagnosed. Occasionally, the diagnosis takes time to understand. Other times, the cause is not determined. Abdominal pain can be a sign that something is very wrong with the pregnancy, or the pain may have nothing to do with the pregnancy at all. For this reason, always tell your health care provider if you have any abdominal discomfort. HOME CARE INSTRUCTIONS  Monitor your abdominal pain for any changes. The following actions may help to alleviate any discomfort you are experiencing:  Do not have sexual intercourse or put anything in your vagina until your symptoms go away completely.  Get plenty of rest until your pain improves.  Drink clear fluids if you feel nauseous. Avoid solid food as long as you are uncomfortable or nauseous.  Only take over-the-counter or prescription medicine as directed by your health care provider.  Keep all follow-up appointments with your health care provider. SEEK IMMEDIATE MEDICAL CARE IF:  You are bleeding, leaking fluid, or passing tissue from the vagina.  You have increasing pain or cramping.  You have persistent vomiting.  You have painful or bloody urination.  You have a fever.  You notice a decrease in your baby's movements.  You have extreme weakness or feel faint.  You have shortness of breath, with or without abdominal pain.  You develop a severe headache with abdominal pain.  You have abnormal vaginal discharge with abdominal pain.  You have persistent diarrhea.  You have abdominal pain that continues even after rest, or gets worse. MAKE SURE YOU:   Understand these instructions.  Will watch your condition.  Will get help right away if you are not doing well or get worse.     This information is not intended to replace advice given to you by your health care provider. Make sure you discuss any questions you have with your health care provider.   Document Released: 03/09/2005 Document Revised: 12/28/2012 Document Reviewed: 10/06/2012 Elsevier Interactive Patient Education 2016 [employer] Morning Sickness Morning sickness is when you feel sick to your stomach (nauseous) during pregnancy. This nauseous feeling may or may not come with vomiting. It often occurs in the morning but can be a problem any time of day. Morning sickness is most common during the first trimester, but it may continue throughout pregnancy. While morning sickness is unpleasant, it is usually harmless unless you develop severe and continual vomiting (hyperemesis gravidarum). This condition requires more intense treatment.  CAUSES  The cause of morning sickness is not completely known but seems to be related to normal hormonal changes that occur in pregnancy. RISK FACTORS You are at greater risk if you:  Experienced nausea or vomiting before your pregnancy.  Had morning sickness during a previous pregnancy.  Are pregnant with more than one baby, such as twins. TREATMENT  Do not use any medicines (prescription, over-the-counter, or herbal) for morning sickness without first talking to your health care provider. Your health care provider may prescribe or recommend:  Vitamin B6 supplements.  Anti-nausea medicines.  The herbal medicine ginger. HOME CARE INSTRUCTIONS   Only take over-the-counter or prescription medicines as directed by your health care provider.  Taking multivitamins before getting pregnant can prevent or decrease the severity of morning sickness in most women.  Eat a piece  of dry toast or unsalted crackers before getting out of bed in the morning.  Eat five or six small meals a day.  Eat dry and bland foods (rice, baked potato). Foods high in carbohydrates are often  helpful.  Do not drink liquids with your meals. Drink liquids between meals.  Avoid greasy, fatty, and spicy foods.  Get someone to cook for you if the smell of any food causes nausea and vomiting.  If you feel nauseous after taking prenatal vitamins, take the vitamins at night or with a snack.  Snack on protein foods (nuts, yogurt, cheese) between meals if you are hungry.  Eat unsweetened gelatins for desserts.  Wearing an acupressure wristband (worn for sea sickness) may be helpful.  Acupuncture may be helpful.  Do not smoke.  Get a humidifier to keep the air in your house free of odors.  Get plenty of fresh air. SEEK MEDICAL CARE IF:   Your home remedies are not working, and you need medicine.  You feel dizzy or lightheaded.  You are losing weight. SEEK IMMEDIATE MEDICAL CARE IF:   You have persistent and uncontrolled nausea and vomiting.  You pass out (faint). MAKE SURE YOU:  Understand these instructions.  Will watch your condition.  Will get help right away if you are not doing well or get worse.   This information is not intended to replace advice given to you by your health care provider. Make sure you discuss any questions you have with your health care provider.   Document Released: 04/30/2006 Document Revised: 03/14/2013 Document Reviewed: 08/24/2012 Elsevier Interactive Patient Education [employer].

## 2015-05-29 NOTE — MAU Note (Signed)
Sending urine to lab.

## 2015-05-29 NOTE — MAU Note (Signed)
Pt c/o severe abdominal cramping starting around 9am. Pt states she is [redacted] weeks pregnant. Pt denies bleeding.

## 2015-06-13 ENCOUNTER — Encounter (HOSPITAL_COMMUNITY): Payer: Self-pay

## 2015-06-14 ENCOUNTER — Ambulatory Visit (HOSPITAL_COMMUNITY): Admission: RE | Admit: 2015-06-14 | Payer: Managed Care, Other (non HMO) | Source: Ambulatory Visit

## 2015-06-21 ENCOUNTER — Ambulatory Visit (HOSPITAL_COMMUNITY): Payer: Managed Care, Other (non HMO)

## 2015-07-05 ENCOUNTER — Ambulatory Visit (HOSPITAL_COMMUNITY)
Admission: RE | Admit: 2015-07-05 | Discharge: 2015-07-05 | Disposition: A | Discharge status: Home / Self Care | Payer: Managed Care, Other (non HMO) | Source: Ambulatory Visit | Attending: Obstetrics and Gynecology | Admitting: Obstetrics and Gynecology

## 2015-07-05 ENCOUNTER — Encounter (HOSPITAL_COMMUNITY): Payer: Self-pay

## 2015-07-05 VITALS — BP 126/75 | HR 86 | Wt 255.0 lb

## 2015-07-05 DIAGNOSIS — E669 Obesity, unspecified: Secondary | ICD-10-CM | POA: Diagnosis not present

## 2015-07-05 DIAGNOSIS — O10012 Pre-existing essential hypertension complicating pregnancy, second trimester: Secondary | ICD-10-CM | POA: Insufficient documentation

## 2015-07-05 DIAGNOSIS — F17210 Nicotine dependence, cigarettes, uncomplicated: Secondary | ICD-10-CM | POA: Diagnosis not present

## 2015-07-05 DIAGNOSIS — E282 Polycystic ovarian syndrome: Secondary | ICD-10-CM | POA: Diagnosis not present

## 2015-07-05 DIAGNOSIS — O99212 Obesity complicating pregnancy, second trimester: Secondary | ICD-10-CM | POA: Insufficient documentation

## 2015-07-05 DIAGNOSIS — O99210 Obesity complicating pregnancy, unspecified trimester: Secondary | ICD-10-CM

## 2015-07-05 DIAGNOSIS — O99332 Smoking (tobacco) complicating pregnancy, second trimester: Secondary | ICD-10-CM | POA: Diagnosis not present

## 2015-07-05 DIAGNOSIS — O26892 Other specified pregnancy related conditions, second trimester: Secondary | ICD-10-CM | POA: Insufficient documentation

## 2015-07-05 DIAGNOSIS — Z3A17 17 weeks gestation of pregnancy: Secondary | ICD-10-CM | POA: Diagnosis not present

## 2015-07-05 DIAGNOSIS — Z833 Family history of diabetes mellitus: Secondary | ICD-10-CM | POA: Diagnosis not present

## 2015-07-05 DIAGNOSIS — O98512 Other viral diseases complicating pregnancy, second trimester: Secondary | ICD-10-CM

## 2015-07-05 DIAGNOSIS — B019 Varicella without complication: Secondary | ICD-10-CM

## 2015-07-05 NOTE — Consult Note (Signed)
MFM consult  30 yr old G3P0020 at [redacted]w[redacted]d referred by Dr. Gaetano Net for positive varicella IgG and IgM.  Past OB hx: 2 miscarriages PMH: PCOS, history of hypertension PSH: none Medications: PNV Allergies: none Social hx: + tobacco now at 1-2 cigarettes/day; no alcohol/ illicit drugs in pregnancy Family hx: strong family history of diabetes  I counseled the patient as follows: 1. Positive varicella IgG and IgM in pregnancy: - patient reports she is unsure if she had chicken pox as a child - she has 8 siblings and recalls many of them having the chicken pox- her mother is also unsure if she had the chicken pox - patient's mother may currently have shingles diagnosed 1 day ago - patient reports no rash or pruritis during pregnancy, no illnesses - was tested as routine screening on 05/17/15 and both the IgG and IgM returned positive; IgM was only slightly positive; IgG was high - titers were repeated on  05/24/15 and IgG was positive and IgM negative - given the above I think it is unlikely that the patient had primary varicella in this pregnancy - she was likely immune prior and either the IgM was a false positive or she was recently reexposed - I discussed there is a slight possibility she acquired varicella in pregnancy for the first time but unlikely especially given no symptoms - given her mother was just diagnosed 1 day ago with possible shingles unlikely to have been the source given labs were positive 7 weeks ago - If patient truly infected during pregnancy for the first time discussed associated risks: - discussed congenital varicella is rare when infection is in the 1st or 2nd trimester- risk is approximately 2% (ranging in the literature from 0-9%). Discussed sequelae include: cutaneous scars, mental retardation, microcephaly, hydrocephalus, seizures, optic nerve atrophy, cataracts, chorioretinitis, limb abnormalities, GI abnormalities, fetal growth restriction - recommend detailed fetal  anatomic survey at 18-20 weeks - recommend serial fetal growth every 6 weeks given risk of fetal growth restriction - inform Pediatrics at delivery - discussed would not be able to determine if there was a primary infection during pregnancy 2. PCOS: - recommend early glucola screening 3. Obesity: - recommend fetal growth surveillance as above 4. Recommend offer aneuploidy screening  I spent a total of 45 minutes with the patient of which >50% was in face to face consultation.  Please call with questions.  [place], MD

## 2015-07-08 ENCOUNTER — Other Ambulatory Visit (HOSPITAL_COMMUNITY): Payer: Self-pay

## 2015-07-16 ENCOUNTER — Other Ambulatory Visit (HOSPITAL_COMMUNITY): Payer: Self-pay | Admitting: Obstetrics and Gynecology

## 2015-07-16 DIAGNOSIS — O283 Abnormal ultrasonic finding on antenatal screening of mother: Secondary | ICD-10-CM

## 2015-07-16 DIAGNOSIS — Z3A22 22 weeks gestation of pregnancy: Secondary | ICD-10-CM

## 2015-07-16 DIAGNOSIS — Z3689 Encounter for other specified antenatal screening: Secondary | ICD-10-CM

## 2015-08-06 ENCOUNTER — Ambulatory Visit (HOSPITAL_COMMUNITY)
Admission: RE | Admit: 2015-08-06 | Discharge: 2015-08-06 | Disposition: A | Discharge status: Home / Self Care | Payer: Managed Care, Other (non HMO) | Source: Ambulatory Visit | Attending: Obstetrics and Gynecology | Admitting: Obstetrics and Gynecology

## 2015-08-06 ENCOUNTER — Encounter (HOSPITAL_COMMUNITY): Payer: Self-pay

## 2015-08-06 ENCOUNTER — Other Ambulatory Visit (HOSPITAL_COMMUNITY): Payer: Self-pay | Admitting: Obstetrics and Gynecology

## 2015-08-06 VITALS — BP 127/66 | HR 101 | Wt 259.0 lb

## 2015-08-06 DIAGNOSIS — O99332 Smoking (tobacco) complicating pregnancy, second trimester: Secondary | ICD-10-CM

## 2015-08-06 DIAGNOSIS — Z3A22 22 weeks gestation of pregnancy: Secondary | ICD-10-CM

## 2015-08-06 DIAGNOSIS — Z315 Encounter for genetic counseling: Secondary | ICD-10-CM | POA: Insufficient documentation

## 2015-08-06 DIAGNOSIS — IMO0001 Reserved for inherently not codable concepts without codable children: Secondary | ICD-10-CM

## 2015-08-06 DIAGNOSIS — IMO0002 Reserved for concepts with insufficient information to code with codable children: Secondary | ICD-10-CM

## 2015-08-06 DIAGNOSIS — Z36 Encounter for antenatal screening of mother: Secondary | ICD-10-CM | POA: Diagnosis not present

## 2015-08-06 DIAGNOSIS — Z3689 Encounter for other specified antenatal screening: Secondary | ICD-10-CM

## 2015-08-06 DIAGNOSIS — O283 Abnormal ultrasonic finding on antenatal screening of mother: Secondary | ICD-10-CM

## 2015-08-06 DIAGNOSIS — O358XX0 Maternal care for other (suspected) fetal abnormality and damage, not applicable or unspecified: Secondary | ICD-10-CM | POA: Insufficient documentation

## 2015-08-06 HISTORY — DX: Reserved for concepts with insufficient information to code with codable children: IMO0002

## 2015-08-06 NOTE — Progress Notes (Addendum)
Genetic Counseling  High-Risk Gestation Note  Appointment Date:  08/06/2015 Referred By: [name], MD Date of Birth:  13-Dec-1985 Partner: Rubye Oaks   Pregnancy History: [id number] Estimated Date of Delivery: 12/10/15 Estimated Gestational Age: [redacted]w[redacted]d Attending: Renella Cunas, MD  Ms. Krystal Edwards was seen for genetic counseling because of abnormal ultrasound findings.    In summary:  Reviewed soft markers on ultrasound and the association with fetal aneuploidy  Reviewed patient is considered low risk for fetal aneuploidy related to maternal age alone  Patient previously declined maternal serum screening in pregnancy  Offered additional screening  NIPS-patient declined at this time but plans to further consider  Discussed option of diagnostic testing  Amniocentesis-patient declined  Reviewed ACOG carrier screening options-patient previously had normal hemoglobinopathy through OB office  Reviewed family history concerns  Follow-up ultrasound scheduled for 09/03/15  Ms. Krystal Edwards was seen for ultrasound given that previous ultrasound at her OB office visualized echogenic intracardiac focus (EIF) and choroid plexus cysts ([place]).  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  The ultrasound report will be sent under separate cover.    We discussed that the second trimester genetic sonogram is targeted at identifying features associated with aneuploidy.  It has evolved as a screening tool used to provide an individualized risk assessment for Down syndrome and other trisomies.  The ability of sonography to aid in the detection of aneuploidies relies on identification of both major structural anomalies and "soft markers."  The patient was counseled that the latter term refers to findings that are often normal variants and do not cause any significant medical problems.  Nonetheless, these markers have a known association with  aneuploidy.    An isolated echogenic focus is generally believed to be a normal variation without any concerns for the pregnancy.  Isolated echogenic cardiac foci are not associated with congenital heart defects in the baby or compromised cardiac function after birth.  However, an echogenic cardiac focus is associated with a slightly increased chance for Down syndrome in the pregnancy. Thus, the presence of an EIF would increase the risk for Down syndrome above the patient's age related risk of  result of 1 in 560 to approximately 1 in 280 (0.4%).   Krystal Edwards was counseled that the choroid plexus is an area in the brain where cerebral spinal fluid, the fluid that bathes the brain and spinal cord, is made.  Cysts, or fluid filled sacs, are sometimes found in the choroid plexus of babies both before and after they are born.  We discussed that approximately 1% of pregnancies evaluated by ultrasound will show choroid plexus cyst (CPCs).  Literature suggests that CPCs are an ultrasound finding in approximately 30-50% of fetuses with trisomy 18, but are an isolated finding in less than 10% of fetuses with trisomy 10.  Krystal Edwards was counseled that when a patient has other risk factors for fetal trisomy 22 (abnormal First trimester or quad screening, advanced maternal age, or another ultrasound finding), CPCs are associated with an increased risk (LR of 9) for trisomy 36.  Newer literature suggests that in the absence of other risk factors, CPCs are likely a normal variation of development or a benign finding.  CPCs are not associated with an increased risk for fetal Down syndrome.  We reviewed chromosomes, nondisjunction, and the common features and variable prognosis for trisomy 23 and the poor prognosis of trisomy 11.  Considering her maternal age of 30 y.o. and  her otherwise normal fetal anatomy ultrasound, Krystal Edwards risk for fetal trisomy 70 is not expected to be increased above her age related adjusted  risk.  However, additional testing options for detection of fetal trisomy 65 and trisomy 72 were discussed.    We reviewed the availability of a follow up ultrasound to monitor fetal growth.  This appointment was scheduled for 09/03/15.  In addition, we reviewed other available screening and diagnostic options including noninvasive prenatal screening (NIPS)/cell free DNA (cfDNA) screening and amniocentesis.  She was counseled regarding the benefits and limitations of each option.  We reviewed the approximate 1 in [id number] risk for complications for amniocentesis, including spontaneous pregnancy loss. We discussed the possible results that the tests might provide including: positive, negative, unanticipated, and no result. Finally, they were counseled regarding the cost of each option and potential out of pocket expenses.   After consideration of all the options, she declined NIPS and amniocentesis today. She planned to further consider NIPS but indicated that she was likely comfortable with the current risk assessment based on age and ultrasound findings alone.   Krystal Edwards was provided with written information regarding sickle cell anemia (SCA) including the carrier frequency and incidence in the African-American population, the availability of carrier testing and prenatal diagnosis if indicated.  In addition, we discussed that hemoglobinopathies are routinely screened for as part of the [place] newborn screening panel.  She previously had hemoglobin electrophoresis, which was within normal range.  Both family histories were reviewed and found to be contributory for birth defects, intellectual disability, and known genetic conditions. Krystal Edwards reported that her mother had breast cancer diagnosed in her 64's. Though most cancers are thought to be sporadic or due to environmental factors, some families appear to have a strong predisposition to cancers.  When considering a family history of  cancer, we look for common types of cancer in multiple family members occurring at younger than typical ages. we discussed the option of meeting with a cancer genetic counselor to discuss any possible screening or testing options available. If they are concerned about the family history of cancer and would like to learn more about the family's chance for an inherited cancer syndrome, her doctor may refer her or her relatives to [facility] [phone]). Without further information regarding the provided family history, an accurate genetic risk cannot be calculated. Further genetic counseling is warranted if more information is obtained.   Ms. ZABELLA CHAFFEE denied exposure to environmental toxins or chemical agents. She denied the use of alcohol, tobacco or street drugs. She denied significant viral illnesses during the course of her pregnancy. She was previously seen for MFM consult on 07/05/15 to discuss positive varicella titers. Her medical and surgical histories were noncontributory.   I counseled Ms. Renly Ledell Peoples regarding the above risks and available options.  The approximate face-to-face time with the genetic counselor was 40 minutes.     Chipper Oman, MS Certified Genetic Counselor 08/06/2015

## 2015-08-13 ENCOUNTER — Encounter (HOSPITAL_COMMUNITY): Payer: Self-pay

## 2015-08-13 ENCOUNTER — Other Ambulatory Visit (HOSPITAL_COMMUNITY): Payer: Self-pay

## 2015-08-15 ENCOUNTER — Encounter: Payer: Self-pay | Admitting: Neurology

## 2015-08-15 ENCOUNTER — Ambulatory Visit (INDEPENDENT_AMBULATORY_CARE_PROVIDER_SITE_OTHER): Payer: Managed Care, Other (non HMO) | Admitting: Neurology

## 2015-08-15 VITALS — BP 134/60 | HR 96 | Resp 18 | Ht 68.0 in | Wt 257.0 lb

## 2015-08-15 DIAGNOSIS — R519 Headache, unspecified: Secondary | ICD-10-CM | POA: Insufficient documentation

## 2015-08-15 DIAGNOSIS — M5481 Occipital neuralgia: Secondary | ICD-10-CM | POA: Diagnosis not present

## 2015-08-15 DIAGNOSIS — O26892 Other specified pregnancy related conditions, second trimester: Secondary | ICD-10-CM

## 2015-08-15 DIAGNOSIS — M542 Cervicalgia: Secondary | ICD-10-CM

## 2015-08-15 DIAGNOSIS — R51 Headache: Secondary | ICD-10-CM

## 2015-08-15 DIAGNOSIS — O26899 Other specified pregnancy related conditions, unspecified trimester: Secondary | ICD-10-CM | POA: Insufficient documentation

## 2015-08-15 NOTE — Progress Notes (Signed)
[facility]  PATIENT: Krystal Edwards DOB: 05/15/1985  REFERRING DOCTOR OR PCP:  Marylynn Pearson ([facility]) SOURCE: notes form Dr. Julien Girt and associates, lab values  _________________________________   HISTORICAL  CHIEF COMPLAINT:  Chief Complaint  Patient presents with  . Headaches    [name] is here for eval of h/a's.  She is [redacted] weeks pregnant.  Denies hx. of h/a's prior to pregnancy.  Sts. h/a's are daily, constant.  Some relief with otc Tylenol, cold cloth.  No relief with Fioricet./fim    HISTORY OF PRESENT ILLNESS:  I had the pleasure of seeing your patient, Krystal Edwards, at [facility] for neurologic consultation regarding her daily headaches during pregnancy.  As you know, she is a 30 year old woman who is currently [redacted] weeks pregnant with her first pregnancy.    Headache started in January and are predominantly on the right in the temporal region. Quality of the headache is pounding when it is more severe.  She has had some nausea but no vomiting. Nothing really triggers the headache as it is always present. She cannot think of any event associated with the onset of the headaches besides the pregnancy.  At night, pain is worse in the occiput and sometimes in the frontal region.    Pain is 24/7 and is present upon awakening.    She usually takes a Tylenol with some benefit three times a day (reduces intensity but does not eliminate headache).  A cold cloth also helps the headache, especially at night. The headache worse with moving, bright lights and loud noises. Generally her headache is also worse when she is at work.  She sometimes will have some spots in her vision but usually the vision is not affected. She has glasses that she wears most of the time. She denies any slurred speech, numbness or weakness. She denies any change in bladder. She has had some constipation.  In the past, headaches were rare. When she was a high  school she will occasionally get a headache and take an Excedrin and the headache would disappear. She never had a headache that lasted more than one day.  She had multiple blood tests because of the pregnancy. There was some question as to whether or not she had varus sella because the one test showed positive IgM. However, another test was more inconclusive. Other tests were essentially normal or as expected.    REVIEW OF SYSTEMS: Constitutional: No fevers, chills, sweats, or change in appetite Eyes: No visual changes, double vision, eye pain Ear, nose and throat: No hearing loss, ear pain, nasal congestion, sore throat Cardiovascular: No chest pain, palpitations Respiratory: No shortness of breath at rest or with exertion.   No wheezes GastrointestinaI: No nausea, vomiting, diarrhea, abdominal pain, fecal incontinence.   Notes constipation Genitourinary: No dysuria, urinary retention or frequency.  No nocturia. Musculoskeletal: No neck pain, back pain Integumentary: No rash, pruritus, skin lesions Neurological: as above Psychiatric: No depression at this time.  No anxiety Endocrine: No palpitations, diaphoresis, change in appetite, change in weigh or increased thirst Hematologic/Lymphatic: No anemia, purpura, petechiae. Allergic/Immunologic: No itchy/runny eyes, nasal congestion, recent allergic reactions, rashes  ALLERGIES: No Known Allergies  HOME MEDICATIONS:  Current outpatient prescriptions:  .  acetaminophen (TYLENOL) 650 MG CR tablet, Take 650 mg by mouth every 8 (eight) hours as needed for pain., Disp: , Rfl:  .  butalbital-acetaminophen-caffeine (FIORICET) 50-325-40 MG tablet, Take 1-2 tablets by mouth every 6 (six) hours as  needed for headache., Disp: 20 tablet, Rfl: 0 .  Doxylamine-Pyridoxine (DICLEGIS) 10-10 MG TBEC, Two tabs at bedtime on day 1 and 2; if symptoms persist, take 1 tablet in morning and 2 tablets at bedtime on day 3; if symptoms persist, may increase to  1 tablet in morning, 1 tablet mid-afternoon, and 2 tablets at bedtime on day 4, Disp: 30 tablet, Rfl: 0 .  Prenatal Vit-Fe Fumarate-FA (PRENATAL MULTIVITAMIN) TABS tablet, Take 1 tablet by mouth daily at 12 noon., Disp: , Rfl:   PAST MEDICAL HISTORY: Past Medical History  Diagnosis Date  . Carpal tunnel syndrome 03-2011    BILATERAL RECEIVES INJECTIONS  . Oligo-ovulation     PAST SURGICAL HISTORY: Past Surgical History  Procedure Laterality Date  . Breast surgery  2009    left breast bx-benign    FAMILY HISTORY: Family History  Problem Relation Age of Onset  . Breast cancer Mother 23    2005 AND 2013  . Diabetes Father   . Diabetes Maternal Grandfather   . Breast cancer Paternal Grandmother   . Breast cancer Paternal Grandfather     SOCIAL HISTORY:  Social History   Social History  . Marital Status: Single    Spouse Name: n/a  . Number of Children: 0  . Years of Education: 14   Occupational History  . customer service rep    Social History Main Topics  . Smoking status: Current Every Day Smoker -- 0.50 packs/day for 9 years    Types: Cigarettes    Last Attempt to Quit: 01/20/2014  . Smokeless tobacco: Never Used  . Alcohol Use: No  . Drug Use: No  . Sexual Activity:    Partners: Male    Birth Control/ Protection: None   Other Topics Concern  . Not on file   Social History Narrative   Lives with father and brother. Mother lives with her sister, also in [place].     PHYSICAL EXAM  Filed Vitals:   08/15/15 0952  BP: 134/60  Pulse: 96  Resp: 18  Height: 5\' 8"  (1.727 m)  Weight: 257 lb (116.574 kg)    Body mass index is 39.09 kg/(m^2).   General: The patient is well-developed and well-nourished and in no acute distress  Eyes:  Funduscopic exam shows normal optic discs and retinal vessels.  Neck: The neck is supple, no carotid bruits are noted.  The neck is tender at the occiput over the splenius capitis muscle and the occipital  nerve.  Cardiovascular: The heart has a regular rate and rhythm with a normal S1 and S2. There were no murmurs, gallops or rubs. Lungs are clear to auscultation.  Skin: Extremities are without significant edema.  Musculoskeletal:  Back is nontender  Neurologic Exam  Mental status: The patient is alert and oriented x 3 at the time of the examination. The patient has apparent normal recent and remote memory, with an apparently normal attention span and concentration ability.   Speech is normal.  Cranial nerves: Extraocular movements are full. Pupils are equal, round, and reactive to light and accomodation.  Visual fields are full.  Facial symmetry is present. There is good facial sensation to soft touch bilaterally.Facial strength is normal.  Trapezius and sternocleidomastoid strength is normal. No dysarthria is noted.  The tongue is midline, and the patient has symmetric elevation of the soft palate. No obvious hearing deficits are noted.  Motor:  Muscle bulk is normal.   Tone is normal. Strength is  5 /  5 in all 4 extremities.   Sensory: Sensory testing is intact to pinprick, soft touch and vibration sensation in all 4 extremities.  Coordination: Cerebellar testing reveals good finger-nose-finger and heel-to-shin bilaterally.  Gait and station: Station is normal.   Gait is normal. Tandem gait is normal. Romberg is negative.   Reflexes: Deep tendon reflexes are symmetric and normal bilaterally.       DIAGNOSTIC DATA (LABS, IMAGING, TESTING) - I reviewed patient records, labs, notes, testing and imaging myself where available.  Lab Results  Component Value Date   WBC 15.1* 05/29/2015   HGB 12.5 05/29/2015   HCT 36.3 05/29/2015   MCV 83.4 05/29/2015   PLT 279 05/29/2015      Component Value Date/Time   NA 134* 04/01/2015 1825   K 3.9 04/01/2015 1825   CL 101 04/01/2015 1825   CO2 20* 04/01/2015 1825   GLUCOSE 125* 04/01/2015 1825   BUN 8 04/01/2015 1825   CREATININE 0.86  04/01/2015 1825   CREATININE 0.81 07/06/2013 0935   CALCIUM 9.3 04/01/2015 1825   PROT 7.4 07/06/2013 0935   ALBUMIN 4.2 07/06/2013 0935   AST 23 07/06/2013 0935   ALT 33 07/06/2013 0935   ALKPHOS 38* 07/06/2013 0935   BILITOT 0.5 07/06/2013 0935   GFRNONAA >60 04/01/2015 1825   GFRAA >60 04/01/2015 1825    Lab Results  Component Value Date   TSH 1.416 07/06/2013       ASSESSMENT AND PLAN  Headache in pregnancy, second trimester  Neck pain  Occipital neuralgia of right side     In summary, Ms. Vollmer is a 30 year old woman who has had a chronic daily headache for the past 4 months while she has been pregnant. On exam, she was tender over the right occiput at the splenius capitis muscle and the right occipital nerve, consistent with right occipital neuralgia.  Her exam was otherwise normal. To try to eliminate the headache, a splenius capitis trigger point injection was performed with 80 mg Depo-Medrol in 3.5 mL Marcaine. This will also help to block the greater occipital nerve. She tolerated the procedure well and there were no complications. Within 5 minutes, pain was greatly improved and she no longer had photophobia. Hopefully, she will get a long-term benefit from this injection. We could always repeat it if needed. However, if he only gets short-term benefit from this type of injection, we may need to consider prophylactic therapy. I would consider Trileptal or lamotrigine as safer options during pregnancy.  She will return to see me as needed but will call if the headache returns or if she has any new or worsening neurologic symptoms.  Thank you for asking me to see Kathlyn for a neurologic consultation. Please let me know if I can be of further assistance with her or other patients in the future.  Nahsir Venezia A. Felecia Shelling, MD, PhD [id number], [id number] AM Certified in Neurology, Clinical Neurophysiology, Sleep Medicine, Pain Medicine and Neuroimaging  [facility] [address], [place], [place] [address] [phone]

## 2015-08-16 ENCOUNTER — Telehealth: Payer: Self-pay | Admitting: *Deleted

## 2015-08-16 MED ORDER — OXCARBAZEPINE 150 MG PO TABS: ORAL_TABLET | ORAL | Status: DC

## 2015-08-16 MED ORDER — ACETAMINOPHEN-CODEINE #3 300-30 MG PO TABS
1.0000 | ORAL_TABLET | Freq: Three times a day (TID) | ORAL | Status: DC | PRN
Start: 2015-08-16 — End: 2015-09-26

## 2015-08-16 NOTE — Telephone Encounter (Signed)
I called pt and she left office numb.  Woke up at 6-7p last night and migraine level worse then 10. (felt like fire).  She took 2 tylenol , no help.   This morning felt pressure middle of head, took 2 tylenol , increasing pain.  Is leaving work now.  Her [phone] if the number to call.  Uses [facility] [place].  Please call.

## 2015-08-16 NOTE — Telephone Encounter (Signed)
Message For: OFC                  Taken 26-MAY-17 at 11:37AM by [facility] Delivered 26-MAY-17 at 11:47AM by JAK to                ------------------------------------------------------------ Krystal Edwards             CID [id number]  Patient [id number]   Pt's Dr CLR UNSURE   Area Code 336 Phone# [phone]     RE MIGRAINES, PATIENT RECEIVED A SHOT 5/25, DID      NOT HELP, MADE WORSE, 5 MONTHS PREGNANT, PLS C/B     Disp:Y/N N If Y = C/B If No Response In 57minutes ============================================================

## 2015-08-16 NOTE — Telephone Encounter (Signed)
HA improved after TPI yesterday but was back 5-6 hours later and more intense.   Today, pain persists.  I will call in Tyl # 3 up to tid prn and Trileptal 150 mg (1 in am, 2 at night) to [facility] to help with the pain.   Advised to not take regular Tylenol at same time as Tyl # 3 --- no more than 4 doses/day

## 2015-08-25 ENCOUNTER — Encounter (HOSPITAL_COMMUNITY): Payer: Self-pay | Admitting: *Deleted

## 2015-08-25 ENCOUNTER — Inpatient Hospital Stay (HOSPITAL_COMMUNITY)
Admission: AD | Admit: 2015-08-25 | Discharge: 2015-08-26 | Disposition: A | Discharge status: Home / Self Care | Payer: Managed Care, Other (non HMO) | Source: Ambulatory Visit | Attending: Emergency Medicine | Admitting: Emergency Medicine

## 2015-08-25 DIAGNOSIS — Z79899 Other long term (current) drug therapy: Secondary | ICD-10-CM | POA: Diagnosis not present

## 2015-08-25 DIAGNOSIS — R42 Dizziness and giddiness: Secondary | ICD-10-CM | POA: Diagnosis not present

## 2015-08-25 DIAGNOSIS — F17210 Nicotine dependence, cigarettes, uncomplicated: Secondary | ICD-10-CM | POA: Insufficient documentation

## 2015-08-25 DIAGNOSIS — R51 Headache: Secondary | ICD-10-CM | POA: Insufficient documentation

## 2015-08-25 DIAGNOSIS — R110 Nausea: Secondary | ICD-10-CM | POA: Insufficient documentation

## 2015-08-25 DIAGNOSIS — IMO0001 Reserved for inherently not codable concepts without codable children: Secondary | ICD-10-CM

## 2015-08-25 DIAGNOSIS — O9989 Other specified diseases and conditions complicating pregnancy, childbirth and the puerperium: Secondary | ICD-10-CM | POA: Diagnosis not present

## 2015-08-25 DIAGNOSIS — H538 Other visual disturbances: Secondary | ICD-10-CM | POA: Insufficient documentation

## 2015-08-25 DIAGNOSIS — Z3A24 24 weeks gestation of pregnancy: Secondary | ICD-10-CM | POA: Diagnosis not present

## 2015-08-25 DIAGNOSIS — O99332 Smoking (tobacco) complicating pregnancy, second trimester: Secondary | ICD-10-CM | POA: Insufficient documentation

## 2015-08-25 DIAGNOSIS — O99352 Diseases of the nervous system complicating pregnancy, second trimester: Secondary | ICD-10-CM | POA: Diagnosis not present

## 2015-08-25 DIAGNOSIS — O26899 Other specified pregnancy related conditions, unspecified trimester: Secondary | ICD-10-CM

## 2015-08-25 DIAGNOSIS — O283 Abnormal ultrasonic finding on antenatal screening of mother: Secondary | ICD-10-CM

## 2015-08-25 DIAGNOSIS — R519 Headache, unspecified: Secondary | ICD-10-CM

## 2015-08-25 HISTORY — DX: Headache: R51

## 2015-08-25 HISTORY — DX: Headache, unspecified: R51.9

## 2015-08-25 MED ORDER — SODIUM CHLORIDE 0.9 % IV SOLN: Freq: Once | INTRAVENOUS | Status: DC

## 2015-08-25 MED ORDER — METOCLOPRAMIDE HCL 5 MG/ML IJ SOLN
10.0000 mg | INTRAMUSCULAR | Status: AC
  Administered 2015-08-25: 10 mg via INTRAVENOUS
  Filled 2015-08-25: qty 2

## 2015-08-25 MED ORDER — SODIUM CHLORIDE 0.9 % IV BOLUS (SEPSIS)
1000.0000 mL | Freq: Once | INTRAVENOUS | Status: AC
  Administered 2015-08-25: 1000 mL via INTRAVENOUS

## 2015-08-25 NOTE — MAU Provider Note (Signed)
History   [id number]   Chief Complaint  Patient presents with  . Migraine  . Dizziness    HPI Krystal Edwards is a 30 y.o. female  G3P0020 at [redacted]w[redacted]d IUP here with report of headache and dizziness.  Intermittent headache for past four months, treated with propranolol, Tylenol with codeine and phenergan.  Pain is described as a throbbing pain that worsens with standing.  Occasional nausea, no vomiting.  +photophobia.  +blurry vision with both eyes.  Symptoms worsened since receiving steroid injection last week.  Reports no relief with the medication. Pain rated a 10/10.  Reports uncomplicated pregnancy.  Denies abdominal pain or vaginal bleeding.  +fetal movement.  Patient's last menstrual period was 12/29/2014.  OB History  Gravida Para Term Preterm AB SAB TAB Ectopic Multiple Living  3    2 2         # Outcome Date GA Lbr Len/2nd Weight Sex Delivery Anes PTL Lv  3 Current           2 SAB           1 SAB               Past Medical History  Diagnosis Date  . Carpal tunnel syndrome 03-2011    BILATERAL RECEIVES INJECTIONS  . Oligo-ovulation   . Headache     Family History  Problem Relation Age of Onset  . Breast cancer Mother 66    2005 AND 2013  . Diabetes Father   . Diabetes Maternal Grandfather   . Breast cancer Paternal Grandmother   . Breast cancer Paternal Grandfather     Social History   Social History  . Marital Status: Single    Spouse Name: n/a  . Number of Children: 0  . Years of Education: 14   Occupational History  . customer service rep    Social History Main Topics  . Smoking status: Current Every Day Smoker -- 0.50 packs/day for 9 years    Types: Cigarettes    Last Attempt to Quit: 01/20/2014  . Smokeless tobacco: Never Used  . Alcohol Use: No  . Drug Use: No  . Sexual Activity:    Partners: Male    Birth Control/ Protection: None   Other Topics Concern  . None   Social History Narrative   Lives with father and brother. Mother lives  with her sister, also in [place].    No Known Allergies  No current facility-administered medications on file prior to encounter.   Current Outpatient Prescriptions on File Prior to Encounter  Medication Sig Dispense Refill  . acetaminophen (TYLENOL) 650 MG CR tablet Take 1,300 mg by mouth every 8 (eight) hours as needed for pain (and/or headaches).     . Prenatal Vit-Fe Fumarate-FA (PRENATAL MULTIVITAMIN) TABS tablet Take 1 tablet by mouth daily.     Marland Kitchen acetaminophen-codeine (TYLENOL #3) 300-30 MG tablet Take 1 tablet by mouth every 8 (eight) hours as needed for moderate pain. (Patient not taking: Reported on 08/25/2015) 30 tablet 1  . butalbital-acetaminophen-caffeine (FIORICET) 50-325-40 MG tablet Take 1-2 tablets by mouth every 6 (six) hours as needed for headache. (Patient not taking: Reported on 08/25/2015) 20 tablet 0  . Doxylamine-Pyridoxine (DICLEGIS) 10-10 MG TBEC Two tabs at bedtime on day 1 and 2; if symptoms persist, take 1 tablet in morning and 2 tablets at bedtime on day 3; if symptoms persist, may increase to 1 tablet in morning, 1 tablet mid-afternoon, and 2 tablets at bedtime on  day 4 (Patient not taking: Reported on 08/25/2015) 30 tablet 0     Review of Systems  Eyes: Positive for visual disturbance.  Cardiovascular: Negative for palpitations.  Gastrointestinal: Negative for abdominal pain.  Genitourinary: Negative for vaginal bleeding and pelvic pain.  Neurological: Positive for dizziness, light-headedness and headaches. Negative for seizures, speech difficulty and numbness.  Psychiatric/Behavioral: Negative for confusion.  All other systems reviewed and are negative.    Physical Exam   Filed Vitals:   08/25/15 1846 08/25/15 2002  BP: 131/73 139/72  Pulse: 91 79  Temp: 98.6 F (37 C)   Resp: 18     Physical Exam  Constitutional: She is oriented to person, place, and time. She appears well-developed and well-nourished.  HENT:  Head: Normocephalic.    Eyes:  EOM are normal. Pupils are equal, round, and reactive to light.  Neck: Normal range of motion. Neck supple.  Cardiovascular: Normal rate, regular rhythm and normal heart sounds.   Respiratory: Effort normal and breath sounds normal.  GI: Soft. She exhibits no mass. There is no tenderness. There is no guarding.  Neurological: She is alert and oriented to person, place, and time. She has normal reflexes.  Skin: Skin is warm and dry.   FHR 150's Toco - no contractions MAU Course  Procedures  MDM 2100 Called [facility], reviewed HPI with Dr. Zenia Resides, recommended scan and consult with neuro [phone] Called Dr. Corinna Capra and discussed recommendation by Dr. Zenia Resides, agrees with plan to contact neuro 2125 Discussed HPI with Dr. Nicole Kindred (neuro) recommended patient go to [facility] for MRI and MRA without contrast 2130 [facility] ED and talked with Dr. Tanna Furry who agrees to accept patient 2132 Dr. Corinna Capra notified and given report > agrees with plan of care  Assessment and Plan  30 y.o. G3P0020 at [redacted]w[redacted]d IUP - stable Headache of unknown origin  Plan: Transfer to [place] form completed IV NS started prior to transfer   Gwen Pounds, CNM 08/25/2015 10:09 PM

## 2015-08-25 NOTE — ED Notes (Signed)
PA at bedside.

## 2015-08-25 NOTE — ED Provider Notes (Addendum)
CSN: [id number]     Arrival date & time 08/25/15  1829 History   First MD Initiated Contact with Patient 08/25/15 2257     Chief Complaint  Patient presents with  . Migraine  . Dizziness    (Consider location/radiation/quality/duration/timing/severity/associated sxs/prior Treatment) HPI Comments: Krystal Edwards is a 30 y.o. [id number] at [redacted]w[redacted]d IUP here with report of headache and dizziness. Symptoms have been daily since January. Dizziness is characterized as feeling off balance and as though sh is going to pass out. This is worse with position change. Intermittent headache for past four months, treated with propranolol, Tylenol with codeine and phenergan.It is present in her R parietal region at this time, but has been migratory since onset 6 months ago. Pain is described as a throbbing pain that worsens with standing.Occasional nausea, no vomiting. +photophobia. +blurry vision with both eyes which is intermittent.Symptoms worsened since receiving steroid injection last week by Neurology. It seems as though neurology also wanted to start Trileptal, but this was not called into the patient's pharmacy. Reports no relief with the aforementioned medications. Pain rated a 10/10. Reports uncomplicated pregnancy.Denies abdominal pain or vaginal bleeding. No fevers, syncope, extremity numbness or weakness.  Patient is a 30 y.o. female presenting with migraines and dizziness. The history is provided by the patient. No language interpreter was used.  Migraine Associated symptoms include headaches and nausea. Pertinent negatives include no vomiting.  Dizziness Associated symptoms: headaches and nausea   Associated symptoms: no vomiting     Past Medical History  Diagnosis Date  . Carpal tunnel syndrome 03-2011    BILATERAL RECEIVES INJECTIONS  . Oligo-ovulation   . Headache    Past Surgical History  Procedure Laterality Date  . Breast surgery  2009    left breast bx-benign   Family  History  Problem Relation Age of Onset  . Breast cancer Mother 4    2005 AND 2013  . Diabetes Father   . Diabetes Maternal Grandfather   . Breast cancer Paternal Grandmother   . Breast cancer Paternal Grandfather    Social History  Substance Use Topics  . Smoking status: Current Every Day Smoker -- 0.50 packs/day for 9 years    Types: Cigarettes    Last Attempt to Quit: 01/20/2014  . Smokeless tobacco: Never Used  . Alcohol Use: No   OB History    Gravida Para Term Preterm AB TAB SAB Ectopic Multiple Living   3    2  2          Review of Systems  Eyes: Positive for photophobia and visual disturbance.  Gastrointestinal: Positive for nausea. Negative for vomiting.  Neurological: Positive for dizziness and headaches.  All other systems reviewed and are negative.   Allergies  Review of patient's allergies indicates no known allergies.  Home Medications   Prior to Admission medications   Medication Sig Start Date End Date Taking? Authorizing Provider  acetaminophen (TYLENOL) 650 MG CR tablet Take 1,300 mg by mouth every 8 (eight) hours as needed for pain (and/or headaches).    Yes Historical Provider, MD  Prenatal Vit-Fe Fumarate-FA (PRENATAL MULTIVITAMIN) TABS tablet Take 1 tablet by mouth daily.    Yes Historical Provider, MD  acetaminophen-codeine (TYLENOL #3) 300-30 MG tablet Take 1 tablet by mouth every 8 (eight) hours as needed for moderate pain. Patient not taking: Reported on 08/25/2015 08/16/15   Britt Bottom, MD  butalbital-acetaminophen-caffeine (FIORICET) [phone] MG tablet Take 1-2 tablets by mouth every 6 (six) hours as  needed for headache. Patient not taking: Reported on 08/25/2015 04/19/15 04/18/16  Mora Bellman, MD  Doxylamine-Pyridoxine (DICLEGIS) 10-10 MG TBEC Two tabs at bedtime on day 1 and 2; if symptoms persist, take 1 tablet in morning and 2 tablets at bedtime on day 3; if symptoms persist, may increase to 1 tablet in morning, 1 tablet mid-afternoon, and 2  tablets at bedtime on day 4 Patient not taking: Reported on 08/25/2015 04/10/15   Olivia Canter Sam, PA-C   BP 123/64 mmHg  Pulse 86  Temp(Src) 97.9 F (36.6 C) (Oral)  Resp 16  Ht 5\' 7"  (1.702 m)  Wt 113.399 kg  BMI 39.15 kg/m2  SpO2 99%  LMP 12/29/2014   Physical Exam  Constitutional: She is oriented to person, place, and time. She appears well-developed and well-nourished. No distress.  Patient tearful, agitated  HENT:  Head: Normocephalic and atraumatic.  Eyes: Conjunctivae and EOM are normal. Pupils are equal, round, and reactive to light. No scleral icterus.  Neck: Normal range of motion.  No nuchal rigidity or meningismus  Cardiovascular: Normal rate, regular rhythm and intact distal pulses.   Pulmonary/Chest: Effort normal and breath sounds normal. No respiratory distress. She has no wheezes. She has no rales.  Respirations even and unlabored  Musculoskeletal: Normal range of motion.  Neurological: She is alert and oriented to person, place, and time. No cranial nerve deficit. She exhibits normal muscle tone. Coordination normal.  GCS 15. Speech is goal oriented. No cranial nerve deficits appreciated; symmetric eyebrow raise, no facial drooping, tongue midline. Patient has equal grip strength bilaterally with 5/5 strength against resistance noted in all major muscle groups bilaterally. Sensation to light touch intact. Patient moves extremities without ataxia.  Skin: Skin is warm and dry. No rash noted. She is not diaphoretic. No erythema. No pallor.  Psychiatric: She has a normal mood and affect. Her behavior is normal.  Nursing note and vitals reviewed.   ED Course  Procedures (including critical care time) Labs Review Labs Reviewed - No data to display  Imaging Review No results found.   I have personally reviewed and evaluated these images and lab results as part of my medical decision-making.   EKG Interpretation None      0045 - Patient reassessed. She reports no  improvement with her headache. I have offered her other medications. Patient frustrated stating, "You just want me to sit here in pain". I expressed to the patient that was not my desire, which is why we are attempting to control her pain with medications. Patient initially stating that she was wanting discharge; however, conversation was interrupted by MRI tech ready to transport patient. Patient declined additional medications before scan, stating she wanted to be transported promptly.  0115 - Notified by MRI tech that patient is unable to tolerate scan secondary to severe claustrophobia. Patient declined medications for sedation. She will be transported back to ED exam room. I discussed with mother the patient's inability to have this test performed. Mother verbalizes understanding.  0130 - Patient return back to the exam room. I had a long discussion with the patient regarding next steps. The patient has been offered a CT and angiogram for further evaluation of her headache. I explained to the patient that this does correlate with some risk of radiation. Patient has been told that we would shield her abdomen as best as possible to prevent any radiation exposure from harming her baby. I have explained, however, that I am unable to say with 100%  certainty that no radiation exposure would occur to her unborn child. Have also discussed coordination of an outpatient MRI under sedation which can be ordered by the patient's primary care doctor or her neurologist. The patient has been told that sedation is only an option if safe in pregnancy. She verbalizes understanding to this effect.  The patient states that she is unwilling to proceed with imaging at this time. She states that she would rather try and coordinate an outpatient MRI through her primary care doctor or her neurologist. She declined CT today for concern of radiation exposure to her unborn child. The patient expresses understanding that we are unable  to rule out any intracranial abnormalities without these imaging tests.  I have also offered the patient additional pain control in the emergency department. She has been ordered for 1 mg of Dilaudid IV. She is open to receiving this medication for pain control tonight. She does not desire any additional medications to be given in the ED following this dosing of Dilaudid. I have recommended that the patient call her neurologist in the morning to discuss the reason for her not being provided a prescription for Trileptal. Per her neurology notes and telephone medication, it seems as though this medication should have been prescribed the patient on 08/16/2015, but was never called into the pharmacy. Patient will be given a prescription for this tonight, but I have advised that she not take the medications until speaking with her neurologist further. Patient also given a prescription for Fioricet which she has used in the past with some temporary improvement.  Given this lengthy discussion with the patient about her options for further care in the emergency department and her verbalized acknowledgement of our inability to rule out any emergent causes of her pain (i.e. hemorrhage, masses, aneurysm, etc...) I do not believe the patient requires discharge AMA. The patient is reliable for follow-up with her primary care doctor and/or OB/GYN as well as with her neurologist. I have told the patient to return to the emergency department for any persistent or worsening symptoms or should she desire to have these imaging tests completed in the near future. Patient verbalizes comfort and understanding with plan. No unaddressed concerns expressed. Patient discharged in satisfactory condition; VSS.   MDM   Final diagnoses:  Intractable episodic headache, unspecified headache type    30 year old female presents to the emergency department for evaluation of intractable headache which has been present for the last 6  months, since becoming pregnant. Patient with a nonfocal neurologic exam today. She is hemodynamically stable and afebrile. Patient exhibits no nuchal rigidity or meningismus to suggest meningitis.  Patient sent from [facility] for imaging to further evaluate cause of headache. Patient was unable to tolerate MRI secondary to claustrophobia. She declined CT today. Attempted headache management pursued with fluids, Reglan, and 1 dose of Dilaudid. Patient declines any further medical management of her symptoms at this time.  Unable to rule out emergent cause of headache, though I have a lower suspicion for masses or hemorrhage given chronicity of symptoms. Preeclampsia considered, but patient has been closely followed by her OB/GYN since onset of her headache with extensive blood work and urine testing. She is not hypertensive today.  Patient discharged with instruction to follow-up with her neurologist and primary care doctors regarding her headache symptoms. Patient given prescription for Trileptal as it seems that her neurologist planned on starting this medication 1 week ago. I have advised the patient not start this medication until  consulting with her neurologist further. Patient verbalizes comfort and understanding of this plan. Return precautions discussed and provided. Patient discharged in satisfactory condition with no unaddressed concerns.   Filed Vitals:   08/25/15 2209 08/25/15 2302 08/26/15 0030 08/26/15 0145  BP: 131/69 123/64 126/73 122/64  Pulse: 84 86 90   Temp: 98.7 F (37.1 C) 97.9 F (36.6 C)    TempSrc: Oral Oral    Resp: 20 16    Height:  5\' 7"  (1.702 m)    Weight:  113.399 kg    SpO2:  99% 99%      Antonietta Breach, PA-C 08/26/15 0211  Orpah Greek, MD 08/26/15 [id number]  Antonietta Breach, PA-C 08/26/15 0214  Orpah Greek, MD 08/26/15 [id number]

## 2015-08-25 NOTE — MAU Note (Signed)
Urine sent to lab 

## 2015-08-25 NOTE — ED Notes (Signed)
[facility], pt is 5 months pregnant. Pt says she has had a persistent headache for 4 months. Sent here for further eval, possible MRI

## 2015-08-25 NOTE — MAU Note (Signed)
Pt presents to MAU with complaints of headache and dizziness. Reports headache for four months and it is just getting worse. Has been prescribed propranolol, tylenol with codeine and phenergan. States the medications aren't working

## 2015-08-26 ENCOUNTER — Inpatient Hospital Stay (HOSPITAL_COMMUNITY): Payer: Managed Care, Other (non HMO)

## 2015-08-26 MED ORDER — HYDROMORPHONE HCL 1 MG/ML IJ SOLN
1.0000 mg | Freq: Once | INTRAMUSCULAR | Status: AC
  Administered 2015-08-26: 1 mg via INTRAVENOUS
  Filled 2015-08-26: qty 1

## 2015-08-26 MED ORDER — BUTALBITAL-APAP-CAFFEINE 50-325-40 MG PO TABS: 1.0000 | ORAL_TABLET | Freq: Three times a day (TID) | ORAL | Status: DC | PRN

## 2015-08-26 MED ORDER — OXCARBAZEPINE 150 MG PO TABS: 150.0000 mg | ORAL_TABLET | Freq: Two times a day (BID) | ORAL | Status: DC

## 2015-08-26 NOTE — Discharge Instructions (Signed)
You have been given a prescription for Trileptal. Do not start this medication unless told to do so by your neurologist. You may take Fioricet as prescribed for pain. Do not take this with additional Tylenol or acetaminophen as there is already Tylenol in this medication. You are unable to tolerate an MRI today. We recommend that you follow-up with your neurologist to try and coordinate an MRI under sedation, as long as it is safe to do so during pregnancy. If you are unable to obtain an MRI, you may want to consider a CT scan to evaluate cause of your headache. Follow-up with your primary care doctor and OB/GYN as needed. We recommend neurology follow-up as soon as you are able. Return to the emergency department as needed for worsening symptoms.  Migraine Headache A migraine headache is an intense, throbbing pain on one or both sides of your head. A migraine can last for 30 minutes to several hours. CAUSES  The exact cause of a migraine headache is not always known. However, a migraine may be caused when nerves in the brain become irritated and release chemicals that cause inflammation. This causes pain. Certain things may also trigger migraines, such as:  Alcohol.  Smoking.  Stress.  Menstruation.  Aged cheeses.  Foods or drinks that contain nitrates, glutamate, aspartame, or tyramine.  Lack of sleep.  Chocolate.  Caffeine.  Hunger.  Physical exertion.  Fatigue.  Medicines used to treat chest pain (nitroglycerine), birth control pills, estrogen, and some blood pressure medicines. SIGNS AND SYMPTOMS  Pain on one or both sides of your head.  Pulsating or throbbing pain.  Severe pain that prevents daily activities.  Pain that is aggravated by any physical activity.  Nausea, vomiting, or both.  Dizziness.  Pain with exposure to bright lights, loud noises, or activity.  General sensitivity to bright lights, loud noises, or smells. Before you get a migraine, you may get  warning signs that a migraine is coming (aura). An aura may include:  Seeing flashing lights.  Seeing bright spots, halos, or zigzag lines.  Having tunnel vision or blurred vision.  Having feelings of numbness or tingling.  Having trouble talking.  Having muscle weakness. DIAGNOSIS  A migraine headache is often diagnosed based on:  Symptoms.  Physical exam.  A CT scan or MRI of your head. These imaging tests cannot diagnose migraines, but they can help rule out other causes of headaches. TREATMENT Medicines may be given for pain and nausea. Medicines can also be given to help prevent recurrent migraines.  HOME CARE INSTRUCTIONS  Only take over-the-counter or prescription medicines for pain or discomfort as directed by your health care provider. The use of long-term narcotics is not recommended.  Lie down in a dark, quiet room when you have a migraine.  Keep a journal to find out what may trigger your migraine headaches. For example, write down:  What you eat and drink.  How much sleep you get.  Any change to your diet or medicines.  Limit alcohol consumption.  Quit smoking if you smoke.  Get 7-9 hours of sleep, or as recommended by your health care provider.  Limit stress.  Keep lights dim if bright lights bother you and make your migraines worse. SEEK IMMEDIATE MEDICAL CARE IF:   Your migraine becomes severe.  You have a fever.  You have a stiff neck.  You have vision loss.  You have muscular weakness or loss of muscle control.  You start losing your balance or  have trouble walking.  You feel faint or pass out.  You have severe symptoms that are different from your first symptoms. MAKE SURE YOU:   Understand these instructions.  Will watch your condition.  Will get help right away if you are not doing well or get worse.   This information is not intended to replace advice given to you by your health care provider. Make sure you discuss any  questions you have with your health care provider.   Document Released: 03/09/2005 Document Revised: 03/30/2014 Document Reviewed: 11/14/2012 Elsevier Interactive Patient Education [employer].

## 2015-08-26 NOTE — ED Notes (Signed)
Patient unable to do MRI due to severe claustrophobia. PA at bedside.

## 2015-08-28 ENCOUNTER — Telehealth: Payer: Self-pay | Admitting: *Deleted

## 2015-08-28 ENCOUNTER — Telehealth: Payer: Self-pay | Admitting: Neurology

## 2015-08-28 MED ORDER — OXCARBAZEPINE 150 MG PO TABS: ORAL_TABLET | ORAL | Status: DC

## 2015-08-28 NOTE — Telephone Encounter (Signed)
Pharmacy called related to Rx: OXcarbazepine (TRILEPTAL) 150 MG tablet instructions.  EDCM confirmed that pt should take 1 tablet in am and 2 qhs.

## 2015-08-28 NOTE — Telephone Encounter (Signed)
Per RAS, pt. should take Oxcarbazepine 150mg , one in the am and two qhs. New rx. escribed to [facility].  I have spoken with Tiffany at [facility] will expect new rx./fim

## 2015-08-28 NOTE — Telephone Encounter (Signed)
Tiffany with [facility] called to get clarification on medication OXcarbazepine (TRILEPTAL) 150 MG tablet phone: [phone]

## 2015-08-28 NOTE — Telephone Encounter (Signed)
I have spoken with Tiffany at [facility].  Pt. was seen in the ER for h/a again--ER started Trileptal 150mg --there are 2 sets of instructions, plus quantity doesn't match instructions.  Will check with RAS and call Tiffany back/fim

## 2015-09-03 ENCOUNTER — Encounter (HOSPITAL_COMMUNITY): Payer: Self-pay

## 2015-09-03 ENCOUNTER — Ambulatory Visit (HOSPITAL_COMMUNITY)
Admission: RE | Admit: 2015-09-03 | Discharge: 2015-09-03 | Disposition: A | Discharge status: Home / Self Care | Payer: Managed Care, Other (non HMO) | Source: Ambulatory Visit | Attending: Obstetrics and Gynecology | Admitting: Obstetrics and Gynecology

## 2015-09-03 VITALS — BP 125/68 | HR 107 | Wt 252.0 lb

## 2015-09-03 DIAGNOSIS — O358XX0 Maternal care for other (suspected) fetal abnormality and damage, not applicable or unspecified: Secondary | ICD-10-CM | POA: Insufficient documentation

## 2015-09-03 DIAGNOSIS — O99332 Smoking (tobacco) complicating pregnancy, second trimester: Secondary | ICD-10-CM | POA: Diagnosis not present

## 2015-09-03 DIAGNOSIS — O283 Abnormal ultrasonic finding on antenatal screening of mother: Secondary | ICD-10-CM

## 2015-09-03 DIAGNOSIS — Z36 Encounter for antenatal screening of mother: Secondary | ICD-10-CM | POA: Diagnosis not present

## 2015-09-03 DIAGNOSIS — Z3A26 26 weeks gestation of pregnancy: Secondary | ICD-10-CM | POA: Diagnosis not present

## 2015-09-03 DIAGNOSIS — IMO0001 Reserved for inherently not codable concepts without codable children: Secondary | ICD-10-CM

## 2015-09-16 ENCOUNTER — Encounter (HOSPITAL_COMMUNITY): Payer: Self-pay | Admitting: Emergency Medicine

## 2015-09-16 ENCOUNTER — Telehealth: Payer: Self-pay | Admitting: *Deleted

## 2015-09-16 ENCOUNTER — Emergency Department (HOSPITAL_COMMUNITY)
Admission: EM | Admit: 2015-09-16 | Discharge: 2015-09-16 | Disposition: A | Discharge status: Home / Self Care | Payer: Managed Care, Other (non HMO) | Attending: Dermatology | Admitting: Dermatology

## 2015-09-16 DIAGNOSIS — O99352 Diseases of the nervous system complicating pregnancy, second trimester: Secondary | ICD-10-CM | POA: Diagnosis not present

## 2015-09-16 DIAGNOSIS — Z5321 Procedure and treatment not carried out due to patient leaving prior to being seen by health care provider: Secondary | ICD-10-CM | POA: Insufficient documentation

## 2015-09-16 DIAGNOSIS — G43909 Migraine, unspecified, not intractable, without status migrainosus: Secondary | ICD-10-CM | POA: Diagnosis not present

## 2015-09-16 DIAGNOSIS — Z3A24 24 weeks gestation of pregnancy: Secondary | ICD-10-CM | POA: Insufficient documentation

## 2015-09-16 NOTE — ED Notes (Signed)
Pt presents to ED for assessment of 6 months of migraines, pt is 6 months pregnant.  Pt was diagnosed with Pituitary Microedema.  Pt denies any unilateral weakness, numbness, etc.

## 2015-09-16 NOTE — ED Notes (Addendum)
Pt family approached this RN requesting update on wait time. RN informed pt of potential wait times. Pt family then demanded pain medication for patient. RN informed family that RN was not able to administer pain medication in the waiting room without an order. Pt family then demanded RN approach an MD for medication order. RN spoke with MD, given verbal order for PO tylenol 1000mg . When RN approached pt for pain medication, pt family states they administered 4 PO tylenol. Pt refused further tylenol at this time.

## 2015-09-16 NOTE — ED Notes (Signed)
Pts mother informed this EMT that they were going to leave. This  EMT watched them walk out ER doors

## 2015-09-16 NOTE — Telephone Encounter (Signed)
Pt office note faxed to [facility] on 09/16/2015.

## 2015-09-19 ENCOUNTER — Telehealth: Payer: Self-pay | Admitting: Neurology

## 2015-09-19 MED FILL — OXYCODONE/APAP 5/325MG: 5-325 | 3 days supply | Qty: 30 | Fill #0

## 2015-09-19 NOTE — Telephone Encounter (Signed)
LMOM (identified vm) for Krystal Edwards to call/fim

## 2015-09-19 NOTE — Telephone Encounter (Signed)
I have spoken with Krystal Edwards this afternoon and offered her an appt. with RAS tomorrow at 0920.  She declined, stating this is too early, she will not have transportation.  Unfortunately, our office closes early on Friday.  Next available appt. given and will call pt. with cancellation.  I also lmom to let Santiago Glad know appt. was offered./fim

## 2015-09-19 NOTE — Telephone Encounter (Signed)
Karen/Dr Corinna Capra [phone] called requesting if the pt can be seen today or tomorrow. She is now [redacted] wks pregnant. Santiago Glad is requesting RN to call her, she will then call the pt

## 2015-09-25 ENCOUNTER — Telehealth: Payer: Self-pay | Admitting: Neurology

## 2015-09-25 NOTE — Telephone Encounter (Signed)
I have spoken with Felicia (on [facility]) this afternoon and given appt. with Megan tomorrow afternoon at 1330/fim

## 2015-09-25 NOTE — Telephone Encounter (Signed)
Pt's mother called in because daughter has been having headaches and her OB told her to take percocet. Mother states that pt can not function. They are requesting for the pt to come in earlier than scheduled. Pt is 6 month pregnant. Mother says they can not get her to eat. Please call and advise [phone]

## 2015-09-26 ENCOUNTER — Telehealth: Payer: Self-pay

## 2015-09-26 ENCOUNTER — Ambulatory Visit (INDEPENDENT_AMBULATORY_CARE_PROVIDER_SITE_OTHER): Payer: Managed Care, Other (non HMO) | Admitting: Adult Health

## 2015-09-26 ENCOUNTER — Encounter: Payer: Self-pay | Admitting: Adult Health

## 2015-09-26 VITALS — BP 144/82 | HR 100 | Resp 20 | Ht 67.0 in | Wt 246.0 lb

## 2015-09-26 DIAGNOSIS — Z3493 Encounter for supervision of normal pregnancy, unspecified, third trimester: Secondary | ICD-10-CM

## 2015-09-26 DIAGNOSIS — G43019 Migraine without aura, intractable, without status migrainosus: Secondary | ICD-10-CM

## 2015-09-26 MED ORDER — ACETAMINOPHEN-CODEINE #3 300-30 MG PO TABS: 1.0000 | ORAL_TABLET | Freq: Three times a day (TID) | ORAL | Status: DC | PRN

## 2015-09-26 MED FILL — ACETAMINOPHEN/COD #3 TABLET: 300-30 | 10 days supply | Qty: 30 | Fill #0

## 2015-09-26 NOTE — Telephone Encounter (Signed)
I called Dr. Sherlynn Stalls office (per Jinny Blossom, NP request) to discuss if starting pt on nortriptyline 10 mg qhs and tylenol #3 q8h prn was ok with Dr. Gaetano Net. I spoke to Dr. Sherlynn Stalls nurse, Marzetta Board, who advised me that she would run this by Dr. Gaetano Net and call me back.

## 2015-09-26 NOTE — Progress Notes (Signed)
PATIENT: Krystal Edwards DOB: October 11, 1985  REASON FOR VISIT: follow up- headache HISTORY FROM: patient  HISTORY OF PRESENT ILLNESS: [name] is a 30 year old female with a history of migraine headaches. Her headaches have been essentially daily in nature since she became pregnant. Her only relief has been with percocet however her OB/GYN has requested that she stop this medication. She has been taking Tylenol. She reports that her headaches are present only on the right. She does have photophobia and phonophobia. Dr. Rachel Moulds ordered Tylenol No. 3. She is unsure of the benefit. Trileptal was also ordered however the patient did not start this due to the possibility of adverse events to the fetus. She would like to be on medication if possible to help prevent her headaches or acute medication. She returns today for follow-up.  HISTORY 08/15/15 (sater): I had the pleasure of seeing your patient, Krystal Edwards, at [facility] for neurologic consultation regarding her daily headaches during pregnancy. As you know, she is a 30 year old woman who is currently [redacted] weeks pregnant with her first pregnancy. Headache started in January and are predominantly on the right in the temporal region. Quality of the headache is pounding when it is more severe. She has had some nausea but no vomiting. Nothing really triggers the headache as it is always present. She cannot think of any event associated with the onset of the headaches besides the pregnancy. At night, pain is worse in the occiput and sometimes in the frontal region. Pain is 24/7 and is present upon awakening. She usually takes a Tylenol with some benefit three times a day (reduces intensity but does not eliminate headache). A cold cloth also helps the headache, especially at night. The headache worse with moving, bright lights and loud noises. Generally her headache is also worse when she is at work.  She sometimes will  have some spots in her vision but usually the vision is not affected. She has glasses that she wears most of the time. She denies any slurred speech, numbness or weakness. She denies any change in bladder. She has had some constipation.  In the past, headaches were rare. When she was a high school she will occasionally get a headache and take an Excedrin and the headache would disappear. She never had a headache that lasted more than one day.  She had multiple blood tests because of the pregnancy. There was some question as to whether or not she had varus sella because the one test showed positive IgM. However, another test was more inconclusive. Other tests were essentially normal or as expected.   REVIEW OF SYSTEMS: Out of a complete 14 system review of symptoms, the patient complains only of the following symptoms, and all other reviewed systems are negative.  See history of present illness  ALLERGIES: No Known Allergies  HOME MEDICATIONS: Outpatient Prescriptions Prior to Visit  Medication Sig Dispense Refill  . acetaminophen (TYLENOL) 650 MG CR tablet Take 1,300 mg by mouth every 8 (eight) hours as needed for pain (and/or headaches).     . butalbital-acetaminophen-caffeine (FIORICET) 50-325-40 MG tablet Take 1-2 tablets by mouth every 8 (eight) hours as needed for headache. 20 tablet 0  . Doxylamine-Pyridoxine (DICLEGIS) 10-10 MG TBEC Two tabs at bedtime on day 1 and 2; if symptoms persist, take 1 tablet in morning and 2 tablets at bedtime on day 3; if symptoms persist, may increase to 1 tablet in morning, 1 tablet mid-afternoon, and 2 tablets at bedtime on  day 4 30 tablet 0  . OXcarbazepine (TRILEPTAL) 150 MG tablet Take 1 tablet in the morning and 2 tablets at bedtime. 90 tablet 0  . Prenatal Vit-Fe Fumarate-FA (PRENATAL MULTIVITAMIN) TABS tablet Take 1 tablet by mouth daily.     Marland Kitchen acetaminophen-codeine (TYLENOL #3) 300-30 MG tablet Take 1 tablet by mouth every 8 (eight) hours as needed  for moderate pain. 30 tablet 1   No facility-administered medications prior to visit.    PAST MEDICAL HISTORY: Past Medical History  Diagnosis Date  . Carpal tunnel syndrome 03-2011    BILATERAL RECEIVES INJECTIONS  . Oligo-ovulation   . Headache     PAST SURGICAL HISTORY: Past Surgical History  Procedure Laterality Date  . Breast surgery  2009    left breast bx-benign    FAMILY HISTORY: Family History  Problem Relation Age of Onset  . Breast cancer Mother 29    2005 AND 2013  . Diabetes Father   . Diabetes Maternal Grandfather   . Breast cancer Paternal Grandmother   . Breast cancer Paternal Grandfather     SOCIAL HISTORY: Social History   Social History  . Marital Status: Single    Spouse Name: n/a  . Number of Children: 0  . Years of Education: 14   Occupational History  . customer service rep    Social History Main Topics  . Smoking status: Current Every Day Smoker -- 0.50 packs/day for 9 years    Types: Cigarettes    Last Attempt to Quit: 01/20/2014  . Smokeless tobacco: Never Used  . Alcohol Use: No  . Drug Use: No  . Sexual Activity:    Partners: Male    Birth Control/ Protection: None   Other Topics Concern  . Not on file   Social History Narrative   Lives with father and brother. Mother lives with her sister, also in [place].      PHYSICAL EXAM  Filed Vitals:   09/26/15 1327  BP: 144/82  Pulse: 100  Resp: 20  Height: 5\' 7"  (1.702 m)  Weight: 246 lb (111.585 kg)   Body mass index is 38.52 kg/(m^2).  Generalized: Well developed, in no acute distress   Neurological examination  Mentation: Alert oriented to time, place, history taking. Follows all commands speech and language fluent Cranial nerve II-XII: Pupils were equal round reactive to light. Extraocular movements were full, visual field were full on confrontational test. Facial sensation and strength were normal. Uvula tongue midline. Head turning and shoulder shrug  were  normal and symmetric. Motor: The motor testing reveals 5 over 5 strength of all 4 extremities. Good symmetric motor tone is noted throughout.  Sensory: Sensory testing is intact to soft touch on all 4 extremities. No evidence of extinction is noted.  Coordination: Cerebellar testing reveals good finger-nose-finger and heel-to-shin bilaterally.  Gait and station: Gait is normal. Tandem gait is normal. Romberg is negative. No drift is seen.  Reflexes: Deep tendon reflexes are symmetric and normal bilaterally.   DIAGNOSTIC DATA (LABS, IMAGING, TESTING) - I reviewed patient records, labs, notes, testing and imaging myself where available.  Lab Results  Component Value Date   WBC 15.1* 05/29/2015   HGB 12.5 05/29/2015   HCT 36.3 05/29/2015   MCV 83.4 05/29/2015   PLT 279 05/29/2015      Component Value Date/Time   NA 134* 04/01/2015 1825   K 3.9 04/01/2015 1825   CL 101 04/01/2015 1825   CO2 20* 04/01/2015 1825   GLUCOSE  125* 04/01/2015 1825   BUN 8 04/01/2015 1825   CREATININE 0.86 04/01/2015 1825   CREATININE 0.81 07/06/2013 0935   CALCIUM 9.3 04/01/2015 1825   PROT 7.4 07/06/2013 0935   ALBUMIN 4.2 07/06/2013 0935   AST 23 07/06/2013 0935   ALT 33 07/06/2013 0935   ALKPHOS 38* 07/06/2013 0935   BILITOT 0.5 07/06/2013 0935   GFRNONAA >60 04/01/2015 1825   GFRAA >60 04/01/2015 1825   No results found for: CHOL, HDL, LDLCALC, LDLDIRECT, TRIG, CHOLHDL No results found for: HGBA1C No results found for: VITAMINB12 Lab Results  Component Value Date   TSH 1.416 07/06/2013      ASSESSMENT AND PLAN 29 y.o. year old female  has a past medical history of Carpal tunnel syndrome (03-2011); Oligo-ovulation; and Headache. here with:  1. Migraine headaches  2. Pregnant third trimester  I will reorder Tylenol No. 3 for the patient to use for acute therapy. I recommended possibly trying nortriptyline 10 mg at bedtime for prevention. The patient and her mother requested a consult with  her OB/GYN before initiating any medications. I will have my nurse call the OB/GYN office. Patient is amenable to this plan. She'll follow-up in 1 month with Dr. Felecia Shelling.   Ward Givens, MSN, NP-C 09/26/2015, 3:35 PM [facility] [address], [place], [place] [address] [phone]

## 2015-09-26 NOTE — Patient Instructions (Signed)
Try tylenol #3 for headache I will consult with OB about preventative daily medication If your symptoms worsen or you develop new symptoms please let us know.

## 2015-09-26 NOTE — Telephone Encounter (Signed)
Dr. Sherlynn Stalls office did not call me back. I called he office again but they were closed for the day.  I called pt to advise her that I have tried calling Dr. Sherlynn Stalls office on behalf of Jinny Blossom, NP to start pt on a new medication, but we are waiting on their call back. No answer, left a message asking her to call me back.  I faxed the tylenol #3 RX to [place]. Received a receipt of confirmation.

## 2015-09-27 MED ORDER — NORTRIPTYLINE HCL 10 MG PO CAPS: 10.0000 mg | ORAL_CAPSULE | Freq: Every day | ORAL | Status: DC

## 2015-09-27 NOTE — Telephone Encounter (Signed)
Medication was called in  

## 2015-09-27 NOTE — Telephone Encounter (Signed)
I called Dr. Sherlynn Stalls office again and spoke to [place], [place]. She advised me that Dr. Gaetano Net said it was ok for pt to start the nortriptyline 10mg  by mouth at bedtime for the short amount of time of 4-6 weeks. Pt comes in for an appt next week to see Dr. Gaetano Net and he will discuss it in detail with her then, but it was ok for Krystal Blossom, NP to order the medication. Ginny, RN said that she will call the pt and advise her that the medication has been ordered from our office and that Dr. Gaetano Net is ok with her taking it only for 4-6 weeks.  Megan, I have not placed this order.

## 2015-09-30 NOTE — Progress Notes (Signed)
I have reviewed and agreed above plan. 

## 2015-10-09 ENCOUNTER — Ambulatory Visit: Payer: Self-pay | Admitting: Neurology

## 2015-10-18 ENCOUNTER — Inpatient Hospital Stay (HOSPITAL_COMMUNITY)
Admission: AD | Admit: 2015-10-18 | Discharge: 2015-10-18 | Disposition: A | Discharge status: Home / Self Care | Payer: Managed Care, Other (non HMO) | Source: Ambulatory Visit | Attending: Obstetrics and Gynecology | Admitting: Obstetrics and Gynecology

## 2015-10-18 ENCOUNTER — Encounter (HOSPITAL_COMMUNITY): Payer: Self-pay

## 2015-10-18 DIAGNOSIS — F17210 Nicotine dependence, cigarettes, uncomplicated: Secondary | ICD-10-CM | POA: Insufficient documentation

## 2015-10-18 DIAGNOSIS — R51 Headache: Secondary | ICD-10-CM | POA: Diagnosis present

## 2015-10-18 DIAGNOSIS — O26893 Other specified pregnancy related conditions, third trimester: Secondary | ICD-10-CM | POA: Diagnosis not present

## 2015-10-18 DIAGNOSIS — G43819 Other migraine, intractable, without status migrainosus: Secondary | ICD-10-CM | POA: Diagnosis not present

## 2015-10-18 DIAGNOSIS — Z3A32 32 weeks gestation of pregnancy: Secondary | ICD-10-CM | POA: Diagnosis not present

## 2015-10-18 DIAGNOSIS — O99333 Smoking (tobacco) complicating pregnancy, third trimester: Secondary | ICD-10-CM | POA: Diagnosis not present

## 2015-10-18 DIAGNOSIS — N898 Other specified noninflammatory disorders of vagina: Secondary | ICD-10-CM | POA: Diagnosis not present

## 2015-10-18 DIAGNOSIS — G5603 Carpal tunnel syndrome, bilateral upper limbs: Secondary | ICD-10-CM | POA: Insufficient documentation

## 2015-10-18 DIAGNOSIS — Z79899 Other long term (current) drug therapy: Secondary | ICD-10-CM | POA: Diagnosis not present

## 2015-10-18 DIAGNOSIS — IMO0001 Reserved for inherently not codable concepts without codable children: Secondary | ICD-10-CM

## 2015-10-18 DIAGNOSIS — O9989 Other specified diseases and conditions complicating pregnancy, childbirth and the puerperium: Secondary | ICD-10-CM

## 2015-10-18 DIAGNOSIS — R109 Unspecified abdominal pain: Secondary | ICD-10-CM | POA: Diagnosis not present

## 2015-10-18 DIAGNOSIS — Z3689 Encounter for other specified antenatal screening: Secondary | ICD-10-CM

## 2015-10-18 DIAGNOSIS — O283 Abnormal ultrasonic finding on antenatal screening of mother: Secondary | ICD-10-CM

## 2015-10-18 DIAGNOSIS — G43019 Migraine without aura, intractable, without status migrainosus: Secondary | ICD-10-CM | POA: Diagnosis not present

## 2015-10-18 LAB — COMPREHENSIVE METABOLIC PANEL
ALK PHOS: 76 U/L (ref 38–126)
ALT: 13 U/L — AB (ref 14–54)
AST: 16 U/L (ref 15–41)
Albumin: 3.4 g/dL — ABNORMAL LOW (ref 3.5–5.0)
Anion gap: 10 (ref 5–15)
BILIRUBIN TOTAL: 0.3 mg/dL (ref 0.3–1.2)
BUN: 6 mg/dL (ref 6–20)
CALCIUM: 9.6 mg/dL (ref 8.9–10.3)
CO2: 20 mmol/L — AB (ref 22–32)
CREATININE: 0.54 mg/dL (ref 0.44–1.00)
Chloride: 100 mmol/L — ABNORMAL LOW (ref 101–111)
Glucose, Bld: 78 mg/dL (ref 65–99)
Potassium: 4.1 mmol/L (ref 3.5–5.1)
SODIUM: 130 mmol/L — AB (ref 135–145)
TOTAL PROTEIN: 7.2 g/dL (ref 6.5–8.1)

## 2015-10-18 LAB — URINALYSIS, ROUTINE W REFLEX MICROSCOPIC
BILIRUBIN URINE: NEGATIVE
Glucose, UA: NEGATIVE mg/dL
KETONES UR: NEGATIVE mg/dL
NITRITE: NEGATIVE
PROTEIN: NEGATIVE mg/dL
Specific Gravity, Urine: >1.03 — ABNORMAL HIGH (ref 1.005–1.030)
pH: 5 (ref 5.0–8.0)

## 2015-10-18 LAB — CBC
HCT: 32.6 % — ABNORMAL LOW (ref 36.0–46.0)
HEMOGLOBIN: 10.9 g/dL — AB (ref 12.0–15.0)
MCH: 27.7 pg (ref 26.0–34.0)
MCHC: 33.4 g/dL (ref 30.0–36.0)
MCV: 83 fL (ref 78.0–100.0)
Platelets: 318 10*3/uL (ref 150–400)
RBC: 3.93 MIL/uL (ref 3.87–5.11)
RDW: 13.7 % (ref 11.5–15.5)
WBC: 14.2 10*3/uL — ABNORMAL HIGH (ref 4.0–10.5)

## 2015-10-18 LAB — WET PREP, GENITAL
Clue Cells Wet Prep HPF POC: NONE SEEN
Sperm: NONE SEEN
Trich, Wet Prep: NONE SEEN
YEAST WET PREP: NONE SEEN

## 2015-10-18 LAB — PROTEIN / CREATININE RATIO, URINE
Creatinine, Urine: 245 mg/dL
Protein Creatinine Ratio: 0.09 mg/mg{Cre} (ref 0.00–0.15)
TOTAL PROTEIN, URINE: 21 mg/dL

## 2015-10-18 LAB — URINE MICROSCOPIC-ADD ON

## 2015-10-18 LAB — AMNISURE RUPTURE OF MEMBRANE (ROM) NOT AT ARMC: AMNISURE: NEGATIVE

## 2015-10-18 MED ORDER — LACTATED RINGERS IV BOLUS (SEPSIS)
1000.0000 mL | Freq: Once | INTRAVENOUS | Status: AC
  Administered 2015-10-18: 1000 mL via INTRAVENOUS

## 2015-10-18 MED ORDER — METOCLOPRAMIDE HCL 10 MG PO TABS: 10.0000 mg | ORAL_TABLET | Freq: Three times a day (TID) | ORAL | 0 refills | Status: DC | PRN

## 2015-10-18 MED ORDER — DEXAMETHASONE SODIUM PHOSPHATE 10 MG/ML IJ SOLN
10.0000 mg | Freq: Once | INTRAMUSCULAR | Status: AC
  Administered 2015-10-18: 10 mg via INTRAVENOUS
  Filled 2015-10-18: qty 1

## 2015-10-18 MED ORDER — DIPHENHYDRAMINE HCL 25 MG PO CAPS: 25.0000 mg | ORAL_CAPSULE | Freq: Four times a day (QID) | ORAL | 0 refills | Status: DC | PRN

## 2015-10-18 MED ORDER — METOCLOPRAMIDE HCL 5 MG/ML IJ SOLN
10.0000 mg | Freq: Once | INTRAMUSCULAR | Status: AC
  Administered 2015-10-18: 10 mg via INTRAVENOUS
  Filled 2015-10-18: qty 2

## 2015-10-18 MED ORDER — OXYCODONE-ACETAMINOPHEN 5-325 MG PO TABS: 1.0000 | ORAL_TABLET | Freq: Four times a day (QID) | ORAL | 0 refills | Status: DC | PRN

## 2015-10-18 MED ORDER — DIPHENHYDRAMINE HCL 50 MG/ML IJ SOLN
25.0000 mg | Freq: Once | INTRAMUSCULAR | Status: AC
  Administered 2015-10-18: 16:00:00 via INTRAVENOUS
  Filled 2015-10-18: qty 1

## 2015-10-18 NOTE — MAU Note (Signed)
Been having migraines since Jan, getting worse.  meds no longer working.  Been having some cramping.    +fFN on 7/13.  Was not given steroids. No bleeding noted underwear wet after shower yesterday, did not continue

## 2015-10-18 NOTE — Discharge Instructions (Signed)

## 2015-10-18 NOTE — Progress Notes (Addendum)
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 Patient unable to provide a urine sample for testing.

## 2015-10-18 NOTE — MAU Provider Note (Signed)
History     CSN: [id number]  Arrival date and time: 10/18/15 1311   None     Chief Complaint  Patient presents with  . Migraine  . Abdominal Cramping   G3P0020 @32 .3 wks c/o constant left frontal HA. She reports the HA started in February but has worsened over the last 2 weeks. 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 She has tried Tylenol w/o relief, last dose earlier today. She also uses a cool packs w/o help. She reports blurry vision, dizziness, and seeing floaters anytime she stands. She was given Percocet in the past and reports as the only thing that helps, she ran out of the Rx. She is being followed by Neurology. She also c/o thin water LOF x3 days. No VB. Good FM. Occ. Ctx. She reports being so miserable and sx worsening to the point that she has thought of ways to induce labor so that she can get help with the HAs. She reports the HA has become incapacitating and is consuming her ADLs.    OB History    Gravida Para Term Preterm AB Living   3       2     SAB TAB Ectopic Multiple Live Births   2              Past Medical History:  Diagnosis Date  . Carpal tunnel syndrome 03-2011   BILATERAL RECEIVES INJECTIONS  . Headache   . Oligo-ovulation     Past Surgical History:  Procedure Laterality Date  . BREAST SURGERY  2009   left breast bx-benign    Family History  Problem Relation Age of Onset  . Breast cancer Mother 59    2005 AND 2013  . Diabetes Father   . Diabetes Maternal Grandfather   . Breast cancer Paternal Grandmother   . Breast cancer Paternal Grandfather     Social History  Substance Use Topics  . Smoking status: Current Every Day Smoker    Packs/day: 0.50    Years: 9.00    Types: Cigarettes    Last attempt to quit: 01/20/2014  . Smokeless tobacco: Never Used  . Alcohol use No    Allergies: No Known Allergies  Prescriptions Prior to Admission  Medication Sig  Dispense Refill Last Dose  . acetaminophen-codeine (TYLENOL #3) 300-30 MG tablet Take 1 tablet by mouth every 8 (eight) hours as needed for moderate pain. 30 tablet 0 10/18/2015 at Unknown time  . butalbital-acetaminophen-caffeine (FIORICET) 50-325-40 MG tablet Take 1-2 tablets by mouth every 8 (eight) hours as needed for headache. (Patient not taking: Reported on 10/18/2015) 20 tablet 0 Not Taking at Unknown time  . Doxylamine-Pyridoxine (DICLEGIS) 10-10 MG TBEC Two tabs at bedtime on day 1 and 2; if symptoms persist, take 1 tablet in morning and 2 tablets at bedtime on day 3; if symptoms persist, may increase to 1 tablet in morning, 1 tablet mid-afternoon, and 2 tablets at bedtime on day 4 (Patient not taking: Reported on 10/18/2015) 30 tablet 0 Not Taking at Unknown time  . nortriptyline (PAMELOR) 10 MG capsule Take 1 capsule (10 mg total) by mouth at bedtime. (Patient not taking: Reported on 10/18/2015) 30 capsule 3 Not Taking at Unknown time  . OXcarbazepine (TRILEPTAL) 150 MG tablet Take 1 tablet in the morning and 2 tablets at bedtime. (Patient not taking: Reported on 10/18/2015) 90 tablet 0 Not Taking at Unknown time  Review of Systems  Constitutional: Negative for fever.  Eyes: Positive for blurred vision and photophobia.  Respiratory: Negative.   Cardiovascular: Negative.   Gastrointestinal: Positive for nausea.  Genitourinary: Negative.   Neurological: Positive for dizziness and headaches. Negative for tingling, tremors, sensory change, speech change, focal weakness, seizures and loss of consciousness.  Psychiatric/Behavioral: Negative for memory loss and suicidal ideas.   Physical Exam   Blood pressure 140/74, pulse 109, temperature 98.9 F (37.2 C), temperature source Oral, resp. rate 18, last menstrual period 12/29/2014.  Physical Exam  Constitutional: She is oriented to person, place, and time. She appears well-developed and well-nourished.  HENT:  Head: Normocephalic and  atraumatic.  Eyes: Pupils are equal, round, and reactive to light.  Neck: Normal range of motion. Neck supple.  Cardiovascular: Normal rate.   Respiratory: Effort normal.  GI: Soft. She exhibits no distension. There is no tenderness.  gravid  Genitourinary:  Genitourinary Comments: SVE: closed/thick  Musculoskeletal: Normal range of motion.  Neurological: She is alert and oriented to person, place, and time.  Skin: Skin is warm and dry.  Psychiatric: She has a normal mood and affect.  EFM: 150 bpm, mod variability, + accels, no decels Toco: none  Results for orders placed or performed during the hospital encounter of 10/18/15 (from the past 24 hour(s))  Urinalysis, Routine w reflex microscopic (not at [facility])     Status: Abnormal   Collection Time: 10/18/15  2:35 PM  Result Value Ref Range   Color, Urine YELLOW YELLOW   APPearance CLEAR CLEAR   Specific Gravity, Urine >1.030 (H) 1.005 - 1.030   pH 5.0 5.0 - 8.0   Glucose, UA NEGATIVE NEGATIVE mg/dL   Hgb urine dipstick TRACE (A) NEGATIVE   Bilirubin Urine NEGATIVE NEGATIVE   Ketones, ur NEGATIVE NEGATIVE mg/dL   Protein, ur NEGATIVE NEGATIVE mg/dL   Nitrite NEGATIVE NEGATIVE   Leukocytes, UA TRACE (A) NEGATIVE  Protein / creatinine ratio, urine     Status: None   Collection Time: 10/18/15  2:35 PM  Result Value Ref Range   Creatinine, Urine 245.00 mg/dL   Total Protein, Urine 21 mg/dL   Protein Creatinine Ratio 0.09 0.00 - 0.15 mg/mg[Cre]  Urine microscopic-add on     Status: Abnormal   Collection Time: 10/18/15  2:35 PM  Result Value Ref Range   Squamous Epithelial / LPF 0-5 (A) NONE SEEN   WBC, UA 0-5 0 - 5 WBC/hpf   RBC / HPF 0-5 0 - 5 RBC/hpf   Bacteria, UA MANY (A) NONE SEEN   Urine-Other MUCOUS PRESENT   Amnisure rupture of membrane (rom)not at [facility]     Status: None   Collection Time: 10/18/15  2:49 PM  Result Value Ref Range   Amnisure ROM NEGATIVE   Wet prep, genital     Status: Abnormal   Collection Time:  10/18/15  2:49 PM  Result Value Ref Range   Yeast Wet Prep HPF POC NONE SEEN NONE SEEN   Trich, Wet Prep NONE SEEN NONE SEEN   Clue Cells Wet Prep HPF POC NONE SEEN NONE SEEN   WBC, Wet Prep HPF POC MODERATE (A) NONE SEEN   Sperm NONE SEEN   CBC     Status: Abnormal   Collection Time: 10/18/15  3:19 PM  Result Value Ref Range   WBC 14.2 (H) 4.0 - 10.5 K/uL   RBC 3.93 3.87 - 5.11 MIL/uL   Hemoglobin 10.9 (L) 12.0 - 15.0 g/dL   HCT  32.6 (L) 36.0 - 46.0 %   MCV 83.0 78.0 - 100.0 fL   MCH 27.7 26.0 - 34.0 pg   MCHC 33.4 30.0 - 36.0 g/dL   RDW 13.7 11.5 - 15.5 %   Platelets 318 150 - 400 K/uL  Comprehensive metabolic panel     Status: Abnormal   Collection Time: 10/18/15  3:19 PM  Result Value Ref Range   Sodium 130 (L) 135 - 145 mmol/L   Potassium 4.1 3.5 - 5.1 mmol/L   Chloride 100 (L) 101 - 111 mmol/L   CO2 20 (L) 22 - 32 mmol/L   Glucose, Bld 78 65 - 99 mg/dL   BUN 6 6 - 20 mg/dL   Creatinine, Ser 0.54 0.44 - 1.00 mg/dL   Calcium 9.6 8.9 - 10.3 mg/dL   Total Protein 7.2 6.5 - 8.1 g/dL   Albumin 3.4 (L) 3.5 - 5.0 g/dL   AST 16 15 - 41 U/L   ALT 13 (L) 14 - 54 U/L   Alkaline Phosphatase 76 38 - 126 U/L   Total Bilirubin 0.3 0.3 - 1.2 mg/dL   GFR calc non Af Amer >60 >60 mL/min   GFR calc Af Amer >60 >60 mL/min   Anion gap 10 5 - 15    MAU Course  Procedures LR IV bolus Decadron IV Benadryl IV Reglan IV  MDM Labs ordered and reviewed. No evidence of pre-e. No evidence of PROM. HA much improved after regimen (from 10 down to 1). Stable for discharge home.  Assessment and Plan  Intractable migraine Reactive NST Leukorrhea  Discharge home Reglan/Tylenol/Benadryl regimen for HA if no relief then Percocet 5/325 1 po q6 hrs prn #30, no refill Follow up in office as scheduled next week Return for worsening sx  Julianne Handler, CNM 10/18/2015, 3:03 PM

## 2015-10-18 NOTE — Progress Notes (Signed)
Taken off efm fhr reactive

## 2015-10-21 LAB — GC/CHLAMYDIA PROBE AMP (~~LOC~~) NOT AT ARMC
CHLAMYDIA, DNA PROBE: NEGATIVE
Neisseria Gonorrhea: NEGATIVE

## 2015-10-30 ENCOUNTER — Ambulatory Visit: Payer: Managed Care, Other (non HMO) | Admitting: Neurology

## 2015-11-11 LAB — OB RESULTS CONSOLE GBS: STREP GROUP B AG: NEGATIVE

## 2015-12-02 ENCOUNTER — Inpatient Hospital Stay (HOSPITAL_COMMUNITY)
Admission: AD | Admit: 2015-12-02 | Discharge: 2015-12-08 | DRG: 774 | Disposition: A | Discharge status: Home / Self Care | Payer: Managed Care, Other (non HMO) | Source: Ambulatory Visit | Attending: Obstetrics and Gynecology | Admitting: Obstetrics and Gynecology

## 2015-12-02 ENCOUNTER — Encounter (HOSPITAL_COMMUNITY): Payer: Self-pay

## 2015-12-02 DIAGNOSIS — O1493 Unspecified pre-eclampsia, third trimester: Secondary | ICD-10-CM | POA: Diagnosis present

## 2015-12-02 DIAGNOSIS — O1490 Unspecified pre-eclampsia, unspecified trimester: Secondary | ICD-10-CM

## 2015-12-02 DIAGNOSIS — O1494 Unspecified pre-eclampsia, complicating childbirth: Secondary | ICD-10-CM | POA: Diagnosis present

## 2015-12-02 DIAGNOSIS — IMO0001 Reserved for inherently not codable concepts without codable children: Secondary | ICD-10-CM

## 2015-12-02 DIAGNOSIS — Z6791 Unspecified blood type, Rh negative: Secondary | ICD-10-CM | POA: Diagnosis not present

## 2015-12-02 DIAGNOSIS — O99334 Smoking (tobacco) complicating childbirth: Secondary | ICD-10-CM | POA: Diagnosis present

## 2015-12-02 DIAGNOSIS — Z6841 Body Mass Index (BMI) 40.0 and over, adult: Secondary | ICD-10-CM

## 2015-12-02 DIAGNOSIS — E282 Polycystic ovarian syndrome: Secondary | ICD-10-CM | POA: Diagnosis present

## 2015-12-02 DIAGNOSIS — O26893 Other specified pregnancy related conditions, third trimester: Secondary | ICD-10-CM | POA: Diagnosis present

## 2015-12-02 DIAGNOSIS — O99214 Obesity complicating childbirth: Secondary | ICD-10-CM | POA: Diagnosis present

## 2015-12-02 DIAGNOSIS — Z3A39 39 weeks gestation of pregnancy: Secondary | ICD-10-CM | POA: Diagnosis not present

## 2015-12-02 DIAGNOSIS — O283 Abnormal ultrasonic finding on antenatal screening of mother: Secondary | ICD-10-CM

## 2015-12-02 HISTORY — DX: Unspecified pre-eclampsia, unspecified trimester: O14.90

## 2015-12-02 LAB — TYPE AND SCREEN
ABO/RH(D): B NEG
Antibody Screen: NEGATIVE

## 2015-12-02 LAB — PROTEIN / CREATININE RATIO, URINE
CREATININE, URINE: 81 mg/dL
PROTEIN CREATININE RATIO: 1.73 mg/mg{creat} — AB (ref 0.00–0.15)
TOTAL PROTEIN, URINE: 140 mg/dL

## 2015-12-02 LAB — CBC
HEMATOCRIT: 30 % — AB (ref 36.0–46.0)
HEMOGLOBIN: 10.1 g/dL — AB (ref 12.0–15.0)
MCH: 27.2 pg (ref 26.0–34.0)
MCHC: 33.7 g/dL (ref 30.0–36.0)
MCV: 80.9 fL (ref 78.0–100.0)
Platelets: 312 10*3/uL (ref 150–400)
RBC: 3.71 MIL/uL — AB (ref 3.87–5.11)
RDW: 14.6 % (ref 11.5–15.5)
WBC: 13.5 10*3/uL — AB (ref 4.0–10.5)

## 2015-12-02 LAB — COMPREHENSIVE METABOLIC PANEL
ALT: 12 U/L — ABNORMAL LOW (ref 14–54)
ANION GAP: 7 (ref 5–15)
AST: 17 U/L (ref 15–41)
Albumin: 3 g/dL — ABNORMAL LOW (ref 3.5–5.0)
Alkaline Phosphatase: 120 U/L (ref 38–126)
BUN: 6 mg/dL (ref 6–20)
CALCIUM: 8.8 mg/dL — AB (ref 8.9–10.3)
CHLORIDE: 108 mmol/L (ref 101–111)
CO2: 22 mmol/L (ref 22–32)
Creatinine, Ser: 0.67 mg/dL (ref 0.44–1.00)
GFR calc non Af Amer: >60 mL/min (ref >60)
Glucose, Bld: 82 mg/dL (ref 65–99)
Potassium: 3.9 mmol/L (ref 3.5–5.1)
SODIUM: 137 mmol/L (ref 135–145)
Total Bilirubin: 0.5 mg/dL (ref 0.3–1.2)
Total Protein: 6.5 g/dL (ref 6.5–8.1)

## 2015-12-02 MED ORDER — MAGNESIUM SULFATE 50 % IJ SOLN
2.0000 g/h | INTRAVENOUS | Status: DC
  Administered 2015-12-03: 2 g/h via INTRAVENOUS
  Filled 2015-12-02 (×3): qty 80

## 2015-12-02 MED ORDER — MISOPROSTOL 25 MCG QUARTER TABLET
25.0000 ug | ORAL_TABLET | ORAL | Status: DC | PRN
  Administered 2015-12-02 – 2015-12-03 (×5): 25 ug via VAGINAL
  Filled 2015-12-02: qty 1
  Filled 2015-12-02 (×5): qty 0.25

## 2015-12-02 MED ORDER — MAGNESIUM SULFATE BOLUS VIA INFUSION
4.0000 g | Freq: Once | INTRAVENOUS | Status: AC
  Administered 2015-12-02: 4 g via INTRAVENOUS
  Filled 2015-12-02: qty 500

## 2015-12-02 MED ORDER — FLEET ENEMA 7-19 GM/118ML RE ENEM: 1.0000 | ENEMA | RECTAL | Status: DC | PRN

## 2015-12-02 MED ORDER — LIDOCAINE HCL (PF) 1 % IJ SOLN
30.0000 mL | INTRAMUSCULAR | Status: DC | PRN
  Filled 2015-12-02: qty 30

## 2015-12-02 MED ORDER — OXYCODONE-ACETAMINOPHEN 5-325 MG PO TABS: 1.0000 | ORAL_TABLET | ORAL | Status: DC | PRN

## 2015-12-02 MED ORDER — TERBUTALINE SULFATE 1 MG/ML IJ SOLN: 0.2500 mg | Freq: Once | INTRAMUSCULAR | Status: DC | PRN

## 2015-12-02 MED ORDER — OXYTOCIN BOLUS FROM INFUSION
500.0000 mL | Freq: Once | INTRAVENOUS | Status: AC
  Administered 2015-12-04: 500 mL via INTRAVENOUS

## 2015-12-02 MED ORDER — ACETAMINOPHEN 325 MG PO TABS
650.0000 mg | ORAL_TABLET | ORAL | Status: DC | PRN
  Administered 2015-12-02 – 2015-12-04 (×6): 650 mg via ORAL
  Filled 2015-12-02 (×6): qty 2

## 2015-12-02 MED ORDER — ONDANSETRON HCL 4 MG/2ML IJ SOLN: 4.0000 mg | Freq: Four times a day (QID) | INTRAMUSCULAR | Status: DC | PRN

## 2015-12-02 MED ORDER — ZOLPIDEM TARTRATE 5 MG PO TABS
5.0000 mg | ORAL_TABLET | Freq: Every evening | ORAL | Status: DC | PRN
  Administered 2015-12-02: 5 mg via ORAL
  Filled 2015-12-02: qty 1

## 2015-12-02 MED ORDER — OXYTOCIN 40 UNITS IN LACTATED RINGERS INFUSION - SIMPLE MED
2.5000 [IU]/h | INTRAVENOUS | Status: DC
  Administered 2015-12-04: 2.5 [IU]/h via INTRAVENOUS
  Filled 2015-12-02 (×2): qty 1000

## 2015-12-02 MED ORDER — SOD CITRATE-CITRIC ACID 500-334 MG/5ML PO SOLN
30.0000 mL | ORAL | Status: DC | PRN
  Administered 2015-12-02 – 2015-12-03 (×2): 30 mL via ORAL
  Filled 2015-12-02 (×2): qty 15

## 2015-12-02 MED ORDER — OXYCODONE-ACETAMINOPHEN 5-325 MG PO TABS: 2.0000 | ORAL_TABLET | ORAL | Status: DC | PRN

## 2015-12-02 MED ORDER — LACTATED RINGERS IV SOLN
INTRAVENOUS | Status: DC
  Administered 2015-12-02 – 2015-12-04 (×5): via INTRAVENOUS

## 2015-12-02 MED ORDER — LACTATED RINGERS IV SOLN: 500.0000 mL | INTRAVENOUS | Status: DC | PRN

## 2015-12-02 NOTE — H&P (Signed)
Krystal Edwards  DICTATION # [id number] CSN# [id number]   Margarette Asal, MD 12/02/2015 8:44 PM

## 2015-12-03 ENCOUNTER — Inpatient Hospital Stay (HOSPITAL_COMMUNITY): Payer: Managed Care, Other (non HMO) | Admitting: Anesthesiology

## 2015-12-03 LAB — CBC
HCT: 32.7 % — ABNORMAL LOW (ref 36.0–46.0)
HEMOGLOBIN: 11.3 g/dL — AB (ref 12.0–15.0)
MCH: 27.8 pg (ref 26.0–34.0)
MCHC: 34.6 g/dL (ref 30.0–36.0)
MCV: 80.3 fL (ref 78.0–100.0)
PLATELETS: 295 10*3/uL (ref 150–400)
RBC: 4.07 MIL/uL (ref 3.87–5.11)
RDW: 14.8 % (ref 11.5–15.5)
WBC: 13.5 10*3/uL — ABNORMAL HIGH (ref 4.0–10.5)

## 2015-12-03 LAB — ABO/RH: ABO/RH(D): B NEG

## 2015-12-03 LAB — RPR: RPR: NONREACTIVE

## 2015-12-03 MED ORDER — EPHEDRINE 5 MG/ML INJ
10.0000 mg | INTRAVENOUS | Status: DC | PRN
  Filled 2015-12-03: qty 4

## 2015-12-03 MED ORDER — FENTANYL 2.5 MCG/ML BUPIVACAINE 1/10 % EPIDURAL INFUSION (WH - ANES)
14.0000 mL/h | INTRAMUSCULAR | Status: DC | PRN
  Administered 2015-12-03 – 2015-12-04 (×4): 14 mL/h via EPIDURAL
  Filled 2015-12-03 (×4): qty 125

## 2015-12-03 MED ORDER — OXYTOCIN 40 UNITS IN LACTATED RINGERS INFUSION - SIMPLE MED
1.0000 m[IU]/min | INTRAVENOUS | Status: DC
  Administered 2015-12-03: 2 m[IU]/min via INTRAVENOUS

## 2015-12-03 MED ORDER — PHENYLEPHRINE 40 MCG/ML (10ML) SYRINGE FOR IV PUSH (FOR BLOOD PRESSURE SUPPORT)
80.0000 ug | PREFILLED_SYRINGE | INTRAVENOUS | Status: DC | PRN
  Filled 2015-12-03: qty 5

## 2015-12-03 MED ORDER — LIDOCAINE HCL (PF) 1 % IJ SOLN
INTRAMUSCULAR | Status: DC | PRN
  Administered 2015-12-03 (×2): 5 mL

## 2015-12-03 MED ORDER — FAMOTIDINE 20 MG PO TABS
20.0000 mg | ORAL_TABLET | Freq: Two times a day (BID) | ORAL | Status: DC
  Administered 2015-12-03 (×2): 20 mg via ORAL
  Filled 2015-12-03 (×2): qty 1

## 2015-12-03 MED ORDER — PHENYLEPHRINE 40 MCG/ML (10ML) SYRINGE FOR IV PUSH (FOR BLOOD PRESSURE SUPPORT)
80.0000 ug | PREFILLED_SYRINGE | INTRAVENOUS | Status: DC | PRN
  Filled 2015-12-03: qty 5
  Filled 2015-12-03: qty 10

## 2015-12-03 MED ORDER — LACTATED RINGERS IV SOLN
500.0000 mL | Freq: Once | INTRAVENOUS | Status: AC
  Administered 2015-12-03: 500 mL via INTRAVENOUS

## 2015-12-03 MED ORDER — TERBUTALINE SULFATE 1 MG/ML IJ SOLN: 0.2500 mg | Freq: Once | INTRAMUSCULAR | Status: DC | PRN

## 2015-12-03 MED ORDER — DIPHENHYDRAMINE HCL 50 MG/ML IJ SOLN
12.5000 mg | INTRAMUSCULAR | Status: DC | PRN
Start: 2015-12-03 — End: 2015-12-04

## 2015-12-03 NOTE — Anesthesia Procedure Notes (Signed)
Epidural Patient location during procedure: OB  Staffing Anesthesiologist: Montez Hageman Performed: anesthesiologist   Preanesthetic Checklist Completed: patient identified, site marked, surgical consent, pre-op evaluation, timeout performed, IV checked, risks and benefits discussed and monitors and equipment checked  Epidural Patient position: sitting Prep: DuraPrep Patient monitoring: heart rate, continuous pulse ox and blood pressure Approach: right paramedian Location: L3-L4 Injection technique: LOR saline  Needle:  Needle type: Tuohy  Needle gauge: 17 G Needle length: 9 cm and 9 Needle insertion depth: 9 cm Catheter type: closed end flexible Catheter size: 20 Guage Catheter at skin depth: 12 cm Test dose: negative  Assessment Events: blood not aspirated, injection not painful, no injection resistance, negative IV test and no paresthesia  Additional Notes Patient identified. Risks/Benefits/Options discussed with patient including but not limited to bleeding, infection, nerve damage, paralysis, failed block, incomplete pain control, headache, blood pressure changes, nausea, vomiting, reactions to medication both or allergic, itching and postpartum back pain. Confirmed with bedside nurse the patient's most recent platelet count. Confirmed with patient that they are not currently taking any anticoagulation, have any bleeding history or any family history of bleeding disorders. Patient expressed understanding and wished to proceed. All questions were answered. Sterile technique was used throughout the entire procedure. Please see nursing notes for vital signs. Test dose was given through epidural needle and negative prior to continuing to dose epidural or start infusion. Warning signs of high block given to the patient including shortness of breath, tingling/numbness in hands, complete motor block, or any concerning symptoms with instructions to call for help. Patient was given  instructions on fall risk and not to get out of bed. All questions and concerns addressed with instructions to call with any issues.

## 2015-12-03 NOTE — H&P (Signed)
NAME:  Krystal Edwards, Krystal Edwards                   ACCOUNT NO.:  MEDICAL RECORD NO.:  [id number]  LOCATION:                                 FACILITY:  PHYSICIAN:  Ralene Bathe. Matthew Saras, M.D.DATE OF BIRTH:  [birth year]  DATE OF ADMISSION: DATE OF DISCHARGE:                             HISTORY & PHYSICAL   CHIEF COMPLAINT:  Preeclampsia at term.  HISTORY OF PRESENT ILLNESS:  A 30 year old, G1, P0, EDD 09/19, scheduled for two-stage induction on September 14th, presents to the office today complaining of some headache with some blurred vision left, 3+ protein. Blood pressure 168/84 with a reactive NST.  She will be admitted for magnesium sulfate in labor induction.  Her GBS was negative.  One hour GTT was 135.  Prenatal course has been significant for the fact that she has a history of smoking.  She had a positive varicella IgG IgM. History of PCOS, has a history of possible pituitary microadenoma that will require MRI evaluation post delivery as she is Rh negative.  [place] labs done last week here in our office were normal.  She will be admitted for two-stage labor induction.  PAST MEDICAL HISTORY:  Please see the [facility] form for detail details.  PHYSICAL EXAMINATION:  VITAL SIGNS: Temperature 98.2, blood pressure 168/84, 3+ protein. HEENT:  Unremarkable. NECK:  Supple.  No masses. LUNGS:  Clear. CARDIOVASCULAR:  Regular rate and rhythm without murmurs, rubs, gallops. BREASTS:  Not examined. GU:  Term fundal height.  Fetal heart rate 142.  Cervix was closed and vertex. EXTREMITIES:  Revealed 2+ edema.  Reflexes 1-2+.  No clonus.  IMPRESSION: 1. 37 and 6/7. 2. Preeclampsia.  PLAN:  We will admit for labor induction, magnesium sulfate.     Rolande Moe M. Matthew Saras, M.D.     RM[facility]/MEDQ  D:  12/02/2015  T:  12/02/2015  Job:  [id number]

## 2015-12-03 NOTE — Anesthesia Pain Management Evaluation Note (Signed)
  CRNA Pain Management Visit Note  Patient: Krystal Edwards, 30 y.o., female  "Hello I am a member of the anesthesia team at [facility]. We have an anesthesia team available at all times to provide care throughout the hospital, including epidural management and anesthesia for C-section. I don't know your plan for the delivery whether it a natural birth, water birth, IV sedation, nitrous supplementation, doula or epidural, but we want to meet your pain goals."   1.Was your pain managed to your expectations on prior hospitalizations?   Yes   2.What is your expectation for pain management during this hospitalization?     Epidural  3.How can we help you reach that goal?   Explaining options for pain control in labor  Record the patient's initial score and the patient's pain goal.   Pain: 1  Pain Goal: 7 [facility] wants you to be able to say your pain was always managed very well.  [name] 12/03/2015

## 2015-12-03 NOTE — Progress Notes (Signed)
S:  Feeling cramping. Currently denies Ha, blurry vision, Cp, SOB and epigastric/RUQ pain.  O:  Vitals:   12/03/15 1102 12/03/15 1200 12/03/15 1301 12/03/15 1404  BP: 136/74 128/64 (!) 152/87 132/86  Pulse: 88 94 85 88  Resp: 18 18 18 20   Temp:   98 F (36.7 C)   TempSrc:   Oral   Weight:      Height:        Gen: NAD SVE: 2/thick/posterior FHT: cat 1 Toco: irritable  A/P: Pt is a G3P0 @39 .0wga here w/pre-eclampsia with severe features, currently being induced.  IOL for pre-X: cervix remains unfavorable cytotec q 4 hours until favorable, s/p 4 doses, last dose @~1100, plan for 5th dose @1500  If remains unfavorable at next check, consider placement of foley catheter. If dilates or effaces more, will initiate pitocin and AROM when in good contraction pattern.  Pre-eclampsia with severe features: Magnesium per protocol PIH labs unremarkable except UPC of 1.73 IV anti-hypertensives prn persistent severe range Bps CTM Bps - currently no persistent severe range  MWB: +tobacco use entire pregnancy, plan for nicotine patch prn withdrawl H/o pituitary microadenoma, outpatient MRI pp H/o +varicella IgM, s/p MFM c/s, will make peds aware once delivers RH neg - for Rhogam eval pp  FWB: cat 1 tracing, reassuring EFW 7#s  GBS neg  Lucillie Garfinkel MD

## 2015-12-03 NOTE — Progress Notes (Signed)
S:  Sleeping - comf. Currently denies Ha, blurry vision, Cp, SOB and epigastric/RUQ pain.  O:  Vitals:   12/03/15 0701 12/03/15 0905 12/03/15 1002 12/03/15 1102  BP: (!) 147/76 (!) 160/87 (!) 149/83 136/74  Pulse: 89 89 100 88  Resp: 18 18 18 18   Temp:      TempSrc:      Weight:      Height:       Gen: NAD SVE: 0.5/thick/posterior FHT: cat 1 Toco: irritable  A/P: Pt is a G3P0 @39 .0wga here w/pre-eclampsia with severe features, currently being induced.  IOL for pre-X: cervix remains unfavorable cytotec q 4 hours until favorable, s/p 4 doses, last dose @~1100  Pre-eclampsia with severe features: Magnesium per protocol PIH labs unremarkable except UPC of 1.73 IV anti-hypertensives prn persistent severe range Bps CTM Bps - currently no persistent severe range  MWB: +tobacco use entire pregnancy, plan for nicotine patch prn withdrawl H/o pituitary microadenoma, outpatient MRI pp H/o +varicella IgM, s/p MFM c/s, will make peds aware once delivers RH neg - for Rhogam eval pp  FWB: cat 1 tracing, reassuring EFW 7#s  GBS neg  Lucillie Garfinkel MD

## 2015-12-03 NOTE — H&P (Deleted)
NAMEPRISILLA, Edwards              ACCOUNT NO.:  [id number]  MEDICAL RECORD NO.:  [id number]  LOCATION:                                 FACILITY:  PHYSICIAN:  Ralene Bathe. Matthew Saras, M.D.DATE OF BIRTH:  12/24/1985  DATE OF ADMISSION:  12/02/2015 DATE OF DISCHARGE:                             HISTORY & PHYSICAL   CHIEF COMPLAINT:  Abnormal bleeding, endometrial polyp, request permanent sterilization.  HISTORY OF PRESENT ILLNESS:  A 30 year old, G1, P1 patient, currently using condoms who has had menorrhagia with increased cramping.  We had tried an IUD without significant response, so she presented for [facility] in our office recently that showed probable adenomyosis and a small fundal polyp.  She is sure she would not want to be pregnant again under any circumstance, so presents at this time for lap tubal with Filshie clips, D and C, hysteroscopy with MyoSure resection of the polyp and NovaSure endometrial ablation.  This procedure including specific risks related to bleeding, infection, adjacent organ injury, wound infection, phlebitis, the permanence of the tubal procedure and failure rated 2 to 05/998 discussed, which she understands and accepts.  PAST MEDICAL HISTORY:  Significant for lupus.  MEDICATIONS:  She is currently on Plaquenil, ibuprofen p.r.n.  FAMILY HISTORY:  Also significant for arthritis.  PAST SURGICAL HISTORY:  One C-section, cholecystectomy.  SOCIAL HISTORY:  Smokes 1 PPD.  Denies drug or alcohol use.  She is divorced.  Her medical doctor is Dr. Sharilyn Sites.  Last Pap in August 2017 was negative.  PHYSICAL EXAM:  VITAL SIGNS:  Temperature 98.2, blood pressure 130/82. HEENT:  Unremarkable. NECK:  Supple without masses. LUNGS:  Clear. CARDIOVASCULAR:  Regular rate and rhythm without murmurs, rubs, or gallops. BREASTS:  Without masses. ABDOMEN:  Soft, flat, nontender. PELVIC:  Vulva, vagina, cervix normal.  Uterus mid position, upper limit of normal size,  mobile.  Adnexa negative. EXTREMITIES:  Unremarkable. NEUROLOGIC:  Unremarkable.  IMPRESSION: 1. Menorrhagia with dysmenorrhea. 2. Probable adenomyosis. 3. Endometrial polyp. 4. Request permanent sterilization.  PLAN:  Laparoscopic tubal with Filshie clips, D and C, hysteroscopy with MyoSure and NovaSure endometrial ablation.  Procedure and risks reviewed as above.     [name] M. Matthew Saras, M.D.   ______________________________ Ralene Bathe. Matthew Saras, M.D.    RM[facility]/MEDQ  D:  12/02/2015  T:  12/03/2015  Job:  [id number]

## 2015-12-03 NOTE — Progress Notes (Signed)
S: Feeling more cramps/maybe contractions. Currently denies Ha, blurry vision, Cp, SOB and epigastric/RUQ pain.  O:  Vitals:   12/03/15 1957 12/03/15 2032  BP: (!) 147/99   Pulse: 95   Resp: 18 18  Temp:      Gen: NAD SVE: 2/2/-2, mid-position, soft FHT: cat 1 Toco: irritable  A/P: Pt is a G3P0 @39 .0wgahere w/pre-eclampsia with severe features, currently being induced.  IOL for pre-X:  - s/p cytotec x 5, last dose @ ~1500 - pitocin initiated - s/p AROM for thin meconium - IUPC placed given inability to trace contractions - cont to titrate pitocin by IUPC pattern  Pre-eclampsia with severe features: Magnesium per protocol PIH labs unremarkable except UPC of 1.73 IV anti-hypertensives prn persistent severe range Bps CTM Bps - currently no persistent severe range  MWB: +tobacco use entire pregnancy, plan for nicotine patch prn withdrawl H/o pituitary microadenoma, outpatient MRI pp H/o +varicella IgM, s/p MFM c/s, will make peds aware once delivers RH neg - for Rhogam eval pp  FWB: cat 1 tracing, reassuring EFW 7#s  GBS neg  Krystal Garfinkel MD

## 2015-12-03 NOTE — Anesthesia Preprocedure Evaluation (Signed)
Anesthesia Evaluation  Patient identified by MRN, date of birth, ID band Patient awake    Reviewed: Allergy & Precautions, H&P , NPO status , Patient's Chart, lab work & pertinent test results  History of Anesthesia Complications Negative for: history of anesthetic complications  Airway Mallampati: II  TM Distance: >3 FB Neck ROM: full    Dental no notable dental hx. (+) Teeth Intact   Pulmonary neg pulmonary ROS, Current Smoker,    Pulmonary exam normal breath sounds clear to auscultation       Cardiovascular hypertension, negative cardio ROS Normal cardiovascular exam Rhythm:regular Rate:Normal     Neuro/Psych negative neurological ROS  negative psych ROS   GI/Hepatic negative GI ROS, Neg liver ROS,   Endo/Other  negative endocrine ROSMorbid obesity  Renal/GU negative Renal ROS  negative genitourinary   Musculoskeletal   Abdominal   Peds  Hematology negative hematology ROS (+)   Anesthesia Other Findings   Reproductive/Obstetrics (+) Pregnancy                             Anesthesia Physical Anesthesia Plan  ASA: II  Anesthesia Plan: Epidural   Post-op Pain Management:    Induction:   Airway Management Planned:   Additional Equipment:   Intra-op Plan:   Post-operative Plan:   Informed Consent: I have reviewed the patients History and Physical, chart, labs and discussed the procedure including the risks, benefits and alternatives for the proposed anesthesia with the patient or authorized representative who has indicated his/her understanding and acceptance.     Plan Discussed with:   Anesthesia Plan Comments:         Anesthesia Quick Evaluation

## 2015-12-04 ENCOUNTER — Encounter (HOSPITAL_COMMUNITY): Payer: Self-pay | Admitting: *Deleted

## 2015-12-04 MED ORDER — PRENATAL MULTIVITAMIN CH
1.0000 | ORAL_TABLET | Freq: Every day | ORAL | Status: DC
  Administered 2015-12-05 – 2015-12-07 (×3): 1 via ORAL
  Filled 2015-12-04 (×3): qty 1

## 2015-12-04 MED ORDER — ONDANSETRON HCL 4 MG/2ML IJ SOLN: 4.0000 mg | INTRAMUSCULAR | Status: DC | PRN

## 2015-12-04 MED ORDER — MEASLES, MUMPS & RUBELLA VAC ~~LOC~~ INJ: 0.5000 mL | INJECTION | Freq: Once | SUBCUTANEOUS | Status: DC

## 2015-12-04 MED ORDER — BENZOCAINE-MENTHOL 20-0.5 % EX AERO
1.0000 "application " | INHALATION_SPRAY | CUTANEOUS | Status: DC | PRN
  Administered 2015-12-04: 1 via TOPICAL
  Filled 2015-12-04: qty 56

## 2015-12-04 MED ORDER — LACTATED RINGERS IV SOLN
INTRAVENOUS | Status: DC
  Administered 2015-12-05: 100 mL/h via INTRAVENOUS
  Administered 2015-12-05: 14:00:00 via INTRAVENOUS
  Administered 2015-12-05: 100 mL/h via INTRAVENOUS

## 2015-12-04 MED ORDER — TETANUS-DIPHTH-ACELL PERTUSSIS 5-2.5-18.5 LF-MCG/0.5 IM SUSP
0.5000 mL | Freq: Once | INTRAMUSCULAR | Status: AC
  Administered 2015-12-06: 0.5 mL via INTRAMUSCULAR
  Filled 2015-12-04: qty 0.5

## 2015-12-04 MED ORDER — WITCH HAZEL-GLYCERIN EX PADS: 1.0000 "application " | MEDICATED_PAD | CUTANEOUS | Status: DC | PRN

## 2015-12-04 MED ORDER — MAGNESIUM SULFATE 50 % IJ SOLN
2.0000 g/h | INTRAVENOUS | Status: DC
  Administered 2015-12-05 – 2015-12-06 (×2): 2 g/h via INTRAVENOUS
  Filled 2015-12-04 (×3): qty 80

## 2015-12-04 MED ORDER — SENNOSIDES-DOCUSATE SODIUM 8.6-50 MG PO TABS
2.0000 | ORAL_TABLET | ORAL | Status: DC
  Administered 2015-12-04 – 2015-12-08 (×4): 2 via ORAL
  Filled 2015-12-04 (×4): qty 2

## 2015-12-04 MED ORDER — IBUPROFEN 600 MG PO TABS
600.0000 mg | ORAL_TABLET | Freq: Four times a day (QID) | ORAL | Status: DC
  Administered 2015-12-04 – 2015-12-08 (×14): 600 mg via ORAL
  Filled 2015-12-04 (×14): qty 1

## 2015-12-04 MED ORDER — MEDROXYPROGESTERONE ACETATE 150 MG/ML IM SUSP: 150.0000 mg | INTRAMUSCULAR | Status: DC | PRN

## 2015-12-04 MED ORDER — ZOLPIDEM TARTRATE 5 MG PO TABS
5.0000 mg | ORAL_TABLET | Freq: Every evening | ORAL | Status: DC | PRN
  Administered 2015-12-06: 5 mg via ORAL
  Filled 2015-12-04: qty 1

## 2015-12-04 MED ORDER — ACETAMINOPHEN 325 MG PO TABS
650.0000 mg | ORAL_TABLET | ORAL | Status: DC | PRN
  Administered 2015-12-04 – 2015-12-07 (×4): 650 mg via ORAL
  Filled 2015-12-04 (×3): qty 2

## 2015-12-04 MED ORDER — DIPHENHYDRAMINE HCL 25 MG PO CAPS: 25.0000 mg | ORAL_CAPSULE | Freq: Four times a day (QID) | ORAL | Status: DC | PRN

## 2015-12-04 MED ORDER — ACETAMINOPHEN 325 MG PO TABS
ORAL_TABLET | ORAL | Status: AC
  Filled 2015-12-04: qty 2

## 2015-12-04 MED ORDER — SIMETHICONE 80 MG PO CHEW: 80.0000 mg | CHEWABLE_TABLET | ORAL | Status: DC | PRN

## 2015-12-04 MED ORDER — ONDANSETRON HCL 4 MG PO TABS
4.0000 mg | ORAL_TABLET | ORAL | Status: DC | PRN
  Administered 2015-12-06: 4 mg via ORAL
  Filled 2015-12-04: qty 1

## 2015-12-04 MED ORDER — COCONUT OIL OIL
1.0000 "application " | TOPICAL_OIL | Status: DC | PRN
  Administered 2015-12-04: 1 via TOPICAL
  Filled 2015-12-04: qty 120

## 2015-12-04 MED ORDER — DIBUCAINE 1 % RE OINT: 1.0000 "application " | TOPICAL_OINTMENT | RECTAL | Status: DC | PRN

## 2015-12-04 MED ORDER — LABETALOL HCL 5 MG/ML IV SOLN
10.0000 mg | Freq: Once | INTRAVENOUS | Status: AC
  Administered 2015-12-04: 10 mg via INTRAVENOUS
  Filled 2015-12-04: qty 4

## 2015-12-04 NOTE — Progress Notes (Signed)
Patient ID: Krystal Edwards, female   DOB: 06/14/1985, 30 y.o.   MRN: [id number] S:  Patient now comf w/CLE. Currently denies Ha, blurry vision, Cp, SOB and epigastric/RUQ pain.  O:  Vitals:   12/04/15 0620 12/04/15 0632 12/04/15 0702 12/04/15 0730  BP: (!) 149/91 (!) 141/83 134/83 132/81  Pulse: 99 96 95 (!) 102  Resp:  18 18 18   Temp:  98 F (36.7 C)    TempSrc:      SpO2:      Weight:      Height:         Gen: NAD SVE: 3/80/-2 FHT: cat 1 Toco: ~150 MVUs  A/P: Pt is a G3P0 @39 .0wgahere w/pre-eclampsia with severe features, currently being induced.  IOL for pre-X:  - s/p cytotec x 5, last dose @ ~1500 pn 912 - cont pitocin, not yet adequate pattern, currently on 22. However, making cervical change so this is reassuring - s/p AROM for thin meconium 9/12 @~2200  Pre-eclampsia with severe features: - Magnesium per protocol - PIH labs unremarkable except UPC of 1.73 - IV anti-hypertensives prn persistent severe range Bps - CTM Bps - currently no persistent severe range  MWB: - +tobacco use entire pregnancy, plan for nicotine patch prn withdrawl - H/o pituitary microadenoma, outpatient MRI pp - H/o +varicella IgM, s/p MFM c/s, will make peds aware once delivers - RH neg - for Rhogam eval pp  FWB: cat 1 tracing, reassuring - EFW 7#s  GBS neg  Lucillie Garfinkel MD

## 2015-12-04 NOTE — Lactation Note (Addendum)
This note was copied from a baby's chart. Lactation Consultation Note  Patient Name: Krystal Edwards Date: 12/04/2015 Reason for consult: Initial assessment   Met with first time mother in [place] prior to transfer to [facility]. Mom delivered a term infant who was a code apgar and transferred to NICU. Mom reports she is planning to BF. Maternal history significant for daily smoking, Pituitary microademona,  and PIH requiring MgSO4.  BF Resources Handout and Providing Milk for Your Infant in NICU Booklet given. Discussed pumping every 2-3 hours with DEBP and what to expect with pumping, milk coming to volume, and BM storage for baby in NICU. Enc mom to begin pumping by 6 hours of age. # stickers and colostrum collection containers were given to mom. Mom is a [facility] client and is aware to call [facility] after d/c to set up an appointment.  Breast pump and pumping supplies were placed in room 317 for RN to set up pump after mom arrives. Debbie, RN was notified of need for pump to be set up for mom. Will follow up tomorrow.     Maternal Data Formula Feeding for Exclusion: No Does the patient have breastfeeding experience prior to this delivery?: No  Feeding    LATCH Score/Interventions                      Lactation Tools Discussed/Used WIC Program: Yes   Consult Status Consult Status: Follow-up Date: 12/05/15 Follow-up type: In-patient    Debby Freiberg Hice 12/04/2015, 6:46 PM

## 2015-12-04 NOTE — Progress Notes (Signed)
Pt comfortable w/ epidural  AF, VSS  BP 130s-150s/80-90s Gen - NAD Abd - gravid, NT  EFW 8# Cvx 4/90/-2  A/P:  Continue pitocin Magnesium for severe pre-e

## 2015-12-04 NOTE — Progress Notes (Addendum)
Called to room as patient crowning.  FHT dropped to 50s during last 2 pushes.  Vacuum applied and infant delivered with next 2 pushes, one pop off.  Dystocia identified and resolved <20 sec with McRoberts & suprapubic pressure.  Tight nuchal cord delivered over body.  NICU called.  Apgars 2,6,8 Placenta delivered spontaneous w/ 3VC.   1st degree lac repaired w/ 3-0 vicryl rapide.  Fundus firm.  EBL 250cc .

## 2015-12-04 NOTE — Progress Notes (Signed)
UR chart review completed.  

## 2015-12-04 NOTE — Anesthesia Postprocedure Evaluation (Signed)
Anesthesia Post Note  Patient: Krystal Edwards  Procedure(s) Performed: * No procedures listed *  Patient location during evaluation: Women's Unit Anesthesia Type: Epidural Level of consciousness: awake and alert Pain management: satisfactory to patient Vital Signs Assessment: post-procedure vital signs reviewed and stable Respiratory status: respiratory function stable Cardiovascular status: stable Postop Assessment: no headache, no backache, epidural receding, patient able to bend at knees, no signs of nausea or vomiting and adequate PO intake Anesthetic complications: no     Last Vitals:  Vitals:   12/04/15 2013 12/04/15 2103  BP: (!) 177/94 (!) 165/79  Pulse: (!) 105 (!) 110  Resp: (!) 24 (!) 22  Temp: 37.2 C 37.1 C    Last Pain:  Vitals:   12/04/15 2115  TempSrc:   PainSc: 0-No pain   Pain Goal: Patients Stated Pain Goal: 3 (12/04/15 2015)               Katherina Mires

## 2015-12-05 ENCOUNTER — Inpatient Hospital Stay (HOSPITAL_COMMUNITY): Admission: RE | Admit: 2015-12-05 | Payer: Managed Care, Other (non HMO) | Source: Ambulatory Visit

## 2015-12-05 LAB — CBC
HEMATOCRIT: 27.9 % — AB (ref 36.0–46.0)
HEMOGLOBIN: 9.5 g/dL — AB (ref 12.0–15.0)
MCH: 27.3 pg (ref 26.0–34.0)
MCHC: 34.1 g/dL (ref 30.0–36.0)
MCV: 80.2 fL (ref 78.0–100.0)
Platelets: 262 10*3/uL (ref 150–400)
RBC: 3.48 MIL/uL — AB (ref 3.87–5.11)
RDW: 14.9 % (ref 11.5–15.5)
WBC: 19.7 10*3/uL — ABNORMAL HIGH (ref 4.0–10.5)

## 2015-12-05 MED ORDER — LABETALOL HCL 200 MG PO TABS
200.0000 mg | ORAL_TABLET | Freq: Two times a day (BID) | ORAL | Status: DC
  Administered 2015-12-05 – 2015-12-06 (×4): 200 mg via ORAL
  Filled 2015-12-05 (×4): qty 1

## 2015-12-05 MED ORDER — LABETALOL HCL 5 MG/ML IV SOLN
10.0000 mg | Freq: Once | INTRAVENOUS | Status: AC
  Administered 2015-12-05: 10 mg via INTRAVENOUS

## 2015-12-05 MED ORDER — LABETALOL HCL 5 MG/ML IV SOLN
INTRAVENOUS | Status: AC
  Administered 2015-12-05: 20 mg
  Filled 2015-12-05: qty 4

## 2015-12-05 NOTE — Lactation Note (Signed)
This note was copied from a baby's chart. Lactation Consultation Note  Patient Name: Krystal Edwards [id number] Date: 12/05/2015 Reason for consult: Follow-up assessment  Baby 82 hours old, in mom's room on the Women's Unit. Mom reports that she really appreciated that there was a Science writer working with her right after delivery, and she really did want to BF. However, mom reports that she decided to switch to formula--even though she knows BF is more healthy--because mom was so sick earlier and couldn't even hold her baby. Mom states that she still does not feel up to nursing and declines any assistance. Mom is aware of [place] phone services after D/C.   Maternal Data    Feeding    LATCH Score/Interventions                      Lactation Tools Discussed/Used     Consult Status Consult Status: PRN    Andres Labrum 12/05/2015, 1:56 PM

## 2015-12-05 NOTE — Progress Notes (Signed)
Post Partum Day 1 Subjective: no complaints, up ad lib, voiding, tolerating PO and denies HA, blurred vision or RUQ pain  Objective: Blood pressure (!) 155/86, pulse 77, temperature 99 F (37.2 C), temperature source Oral, resp. rate 18, height 5\' 7"  (1.702 m), weight 270 lb (122.5 kg), last menstrual period 12/29/2014, SpO2 99 %, unknown if currently breastfeeding.  Physical Exam:  General: alert and cooperative Lochia: appropriate Uterine Fundus: firm Incision: healing well DVT Evaluation: No evidence of DVT seen on physical exam. Negative Homan's sign. No cords or calf tenderness. No significant calf/ankle edema. DTR's 1 + no clonus   Recent Labs  12/03/15 1858 12/05/15 0526  HGB 11.3* 9.5*  HCT 32.7* 27.9*    Assessment/Plan: Plan for discharge tomorrow   continue MAG until am. Plan to repeat labs in am   LOS: 3 days   Krystal Edwards G 12/05/2015, 9:03 AM

## 2015-12-06 LAB — COMPREHENSIVE METABOLIC PANEL
ALK PHOS: 79 U/L (ref 38–126)
ALT: 14 U/L (ref 14–54)
ANION GAP: 5 (ref 5–15)
AST: 20 U/L (ref 15–41)
Albumin: 2.4 g/dL — ABNORMAL LOW (ref 3.5–5.0)
BILIRUBIN TOTAL: 0.5 mg/dL (ref 0.3–1.2)
BUN: 8 mg/dL (ref 6–20)
CALCIUM: 7.4 mg/dL — AB (ref 8.9–10.3)
CO2: 24 mmol/L (ref 22–32)
CREATININE: 0.86 mg/dL (ref 0.44–1.00)
Chloride: 107 mmol/L (ref 101–111)
GFR calc non Af Amer: >60 mL/min (ref >60)
Glucose, Bld: 87 mg/dL (ref 65–99)
Potassium: 3.7 mmol/L (ref 3.5–5.1)
Sodium: 136 mmol/L (ref 135–145)
TOTAL PROTEIN: 5.7 g/dL — AB (ref 6.5–8.1)

## 2015-12-06 LAB — CBC
HEMATOCRIT: 25.1 % — AB (ref 36.0–46.0)
HEMOGLOBIN: 8.3 g/dL — AB (ref 12.0–15.0)
MCH: 27.1 pg (ref 26.0–34.0)
MCHC: 33.1 g/dL (ref 30.0–36.0)
MCV: 82 fL (ref 78.0–100.0)
Platelets: 255 10*3/uL (ref 150–400)
RBC: 3.06 MIL/uL — ABNORMAL LOW (ref 3.87–5.11)
RDW: 15.3 % (ref 11.5–15.5)
WBC: 14 10*3/uL — ABNORMAL HIGH (ref 4.0–10.5)

## 2015-12-06 MED ORDER — FUROSEMIDE 20 MG PO TABS
20.0000 mg | ORAL_TABLET | Freq: Once | ORAL | Status: AC
  Administered 2015-12-06: 20 mg via ORAL
  Filled 2015-12-06: qty 1

## 2015-12-06 MED ORDER — NIFEDIPINE ER 30 MG PO TB24
30.0000 mg | ORAL_TABLET | Freq: Two times a day (BID) | ORAL | Status: DC
  Administered 2015-12-06 – 2015-12-07 (×2): 30 mg via ORAL
  Filled 2015-12-06 (×2): qty 1

## 2015-12-06 MED ORDER — RHO D IMMUNE GLOBULIN 1500 UNIT/2ML IJ SOSY
300.0000 ug | PREFILLED_SYRINGE | Freq: Once | INTRAMUSCULAR | Status: AC
  Administered 2015-12-06: 300 ug via INTRAVENOUS
  Filled 2015-12-06: qty 2

## 2015-12-06 NOTE — Lactation Note (Signed)
This note was copied from a baby's chart. Lactation Consultation Note  Patient Name: Krystal Edwards [id number] Date: 12/06/2015 Reason for consult: Follow-up assessment   With this mom of a term baby, now 64 hours old . Mom tried breastfeeding, but said her hands are too swollen for her to keep baby at her breast. Mom agreed to a DEP, and I showed her how to use pump and hand express. Mom has a steady stream of transitional milk with hand expression.  I check on mom about an hour later, and she had stopped pumping because the baby was crying, and formula fed her baby. I encoraged her to feed baby the drop she had expressed, and to try pumping again. Mom seems to not be feeling well, and was trhying to sleep when I walked in the room.   Maternal Data    Feeding    LATCH Score/Interventions                      Lactation Tools Discussed/Used Pump Review: Setup, frequency, and cleaning;Milk Storage;Other (comment) (hand expression, pump setting) Initiated by:: Franky Macho RN IBCLC Date initiated:: 12/07/15   Consult Status Consult Status: Follow-up Date: 12/07/15 Follow-up type: In-patient    Tonna Corner 12/06/2015, 11:55 AM

## 2015-12-06 NOTE — Progress Notes (Signed)
T/c Dr. Lynnette Caffey regarding pedal edema and pain, see new orders.

## 2015-12-06 NOTE — Progress Notes (Signed)
Post Partum Day 2 Subjective: no complaints, up ad lib, voiding, tolerating PO and No HA, CP/SOB, RUQ pain or visual disturbance.  Scant locia.  Objective: Blood pressure (!) 143/86, pulse 83, temperature 98 F (36.7 C), temperature source Oral, resp. rate 18, height 5\' 7"  (1.702 m), weight 275 lb 0.1 oz (124.7 kg), last menstrual period 12/29/2014, SpO2 100 %, unknown if currently breastfeeding.  Physical Exam:  General: alert, cooperative and appears stated age Lochia: appropriate Uterine Fundus: firm Incision: healing well, no significant drainage, no dehiscence DVT Evaluation: No evidence of DVT seen on physical exam. Negative Homan's sign. No cords or calf tenderness. Ext: 1+ edema, 1+DTRs, no clonus  Recent Labs  12/05/15 0526 12/06/15 0544  HGB 9.5* 8.3*  HCT 27.9* 25.1*   Pre-eclampsia labs stable  Assessment/Plan: Plan for discharge tomorrow  Pre-E: D/C magnesium sulfate.  Start procardia prn.      LOS: 4 days   Krystal Edwards 12/06/2015, 8:33 AM

## 2015-12-06 NOTE — Progress Notes (Signed)
Instructed patient not to sleep with baby in bed.  Baby removed and placed in [place]

## 2015-12-07 LAB — RH IG WORKUP (INCLUDES ABO/RH)
ABO/RH(D): B NEG
FETAL SCREEN: NEGATIVE
Gestational Age(Wks): 39
Unit division: 0

## 2015-12-07 MED ORDER — LABETALOL HCL 200 MG PO TABS
200.0000 mg | ORAL_TABLET | Freq: Three times a day (TID) | ORAL | Status: DC
  Administered 2015-12-07: 200 mg via ORAL
  Filled 2015-12-07: qty 1

## 2015-12-07 MED ORDER — LABETALOL HCL 200 MG PO TABS: 200.0000 mg | ORAL_TABLET | Freq: Three times a day (TID) | ORAL | Status: DC

## 2015-12-07 MED ORDER — LABETALOL HCL 200 MG PO TABS
400.0000 mg | ORAL_TABLET | Freq: Three times a day (TID) | ORAL | Status: DC
  Administered 2015-12-07 – 2015-12-08 (×2): 400 mg via ORAL
  Filled 2015-12-07 (×2): qty 2

## 2015-12-07 MED ORDER — NIFEDIPINE ER 60 MG PO TB24
60.0000 mg | ORAL_TABLET | Freq: Two times a day (BID) | ORAL | Status: DC
  Administered 2015-12-07 – 2015-12-08 (×3): 60 mg via ORAL
  Filled 2015-12-07 (×3): qty 1

## 2015-12-07 MED ORDER — FUROSEMIDE 40 MG PO TABS
40.0000 mg | ORAL_TABLET | Freq: Once | ORAL | Status: AC
  Administered 2015-12-07: 40 mg via ORAL
  Filled 2015-12-07: qty 1

## 2015-12-07 NOTE — Lactation Note (Signed)
This note was copied from a baby's chart. Lactation Consultation Note  Patient Name: Krystal Edwards [id number] Date: 12/07/2015 Reason for consult: Follow-up assessment Mom reports to [facility] that she has decided to formula/bottle feed. Discussed considering pump/bottle but Mom reports at this time she plans to use formula. Discussed ways to dry her milk. Encouraged to call for questions/concerns.   Maternal Data    Feeding    LATCH Score/Interventions                      Lactation Tools Discussed/Used Tools: Pump Breast pump type: Double-Electric Breast Pump   Consult Status Consult Status: Complete Date: 12/07/15 Follow-up type: In-patient    Katrine Coho 12/07/2015, 4:05 PM

## 2015-12-07 NOTE — Progress Notes (Signed)
Post Partum Day 3 Subjective: no complaints, up ad lib, voiding, tolerating PO, + flatus and no HA, CP/SOB, RUQ pain, or visual disturbance.    Objective: Blood pressure (!) 160/84, pulse 86, temperature 98.4 F (36.9 C), temperature source Oral, resp. rate 18, height 5\' 7"  (1.702 m), weight 274 lb 12 oz (124.6 kg), last menstrual period 12/29/2014, SpO2 100 %, unknown if currently breastfeeding.  Physical Exam:  General: alert, cooperative and appears stated age Lochia: appropriate Uterine Fundus: firm Incision: healing well, no significant drainage, no dehiscence DVT Evaluation: No evidence of DVT seen on physical exam. Negative Homan's sign. No cords or calf tenderness.   Recent Labs  12/05/15 0526 12/06/15 0544  HGB 9.5* 8.3*  HCT 27.9* 25.1*    Assessment/Plan: Breastfeeding  PreE-received 2 doses of lasix and changed antihypertensive from labetalol to procardia; currently 60 xl bid.  Will continue to monitor BP and d/c home when stable.  No symptoms. Meeting all postpartum goals.   LOS: 5 days   Krystal Edwards 12/07/2015, 10:52 AM

## 2015-12-07 NOTE — Progress Notes (Signed)
1400  Bp 174/86 . Dr. Lynnette Caffey notified of same. Orders were received and carried. Pt will not be discharged this p.m,.

## 2015-12-07 NOTE — Plan of Care (Signed)
Problem: Problem: Cardiovascular Progression Goal: OTHER CARDIOVASCULAR GOAL(S) B/P within normal limits  Outcome: Not Progressing Blood pressure continues to be elevated.Additional blood pressure medication added and blood continue pressures every 2 hours.

## 2015-12-08 LAB — COMPREHENSIVE METABOLIC PANEL
ALT: 41 U/L (ref 14–54)
ANION GAP: 9 (ref 5–15)
AST: 50 U/L — AB (ref 15–41)
Albumin: 2.8 g/dL — ABNORMAL LOW (ref 3.5–5.0)
Alkaline Phosphatase: 66 U/L (ref 38–126)
BUN: 10 mg/dL (ref 6–20)
CALCIUM: 8.8 mg/dL — AB (ref 8.9–10.3)
CHLORIDE: 103 mmol/L (ref 101–111)
CO2: 24 mmol/L (ref 22–32)
Creatinine, Ser: 0.8 mg/dL (ref 0.44–1.00)
GFR calc non Af Amer: >60 mL/min (ref >60)
GLUCOSE: 110 mg/dL — AB (ref 65–99)
POTASSIUM: 3.5 mmol/L (ref 3.5–5.1)
SODIUM: 136 mmol/L (ref 135–145)
Total Bilirubin: 0.4 mg/dL (ref 0.3–1.2)
Total Protein: 6.6 g/dL (ref 6.5–8.1)

## 2015-12-08 LAB — CBC
HCT: 26.7 % — ABNORMAL LOW (ref 36.0–46.0)
Hemoglobin: 8.7 g/dL — ABNORMAL LOW (ref 12.0–15.0)
MCH: 26.8 pg (ref 26.0–34.0)
MCHC: 32.6 g/dL (ref 30.0–36.0)
MCV: 82.2 fL (ref 78.0–100.0)
PLATELETS: 275 10*3/uL (ref 150–400)
RBC: 3.25 MIL/uL — AB (ref 3.87–5.11)
RDW: 15 % (ref 11.5–15.5)
WBC: 11.5 10*3/uL — AB (ref 4.0–10.5)

## 2015-12-08 MED ORDER — PROMETHAZINE HCL 25 MG/ML IJ SOLN: 12.5000 mg | Freq: Four times a day (QID) | INTRAMUSCULAR | Status: DC | PRN

## 2015-12-08 MED ORDER — LABETALOL HCL 200 MG PO TABS: 400.0000 mg | ORAL_TABLET | Freq: Three times a day (TID) | ORAL | 1 refills | Status: DC

## 2015-12-08 MED ORDER — IBUPROFEN 600 MG PO TABS: 600.0000 mg | ORAL_TABLET | Freq: Four times a day (QID) | ORAL | 0 refills | Status: DC

## 2015-12-08 MED ORDER — NIFEDIPINE ER 60 MG PO TB24: 60.0000 mg | ORAL_TABLET | Freq: Two times a day (BID) | ORAL | 1 refills | Status: DC

## 2015-12-08 NOTE — Discharge Summary (Signed)
Obstetric Discharge Summary Reason for Admission: induction of labor and pre-eclampsia Prenatal Procedures: none Intrapartum Procedures: spontaneous vaginal delivery and vacuum Postpartum Procedures: none Complications-Operative and Postpartum: 1st degree perineal laceration and shoulder dystocia  Hemoglobin  Date Value Ref Range Status  12/08/2015 8.7 (L) 12.0 - 15.0 g/dL Final   HCT  Date Value Ref Range Status  12/08/2015 26.7 (L) 36.0 - 46.0 % Final    Physical Exam:  General: alert, cooperative and appears stated age 30: appropriate Uterine Fundus: firm Incision: healing well, no significant drainage, no dehiscence DVT Evaluation: No evidence of DVT seen on physical exam. Negative Homan's sign. No cords or calf tenderness.  Discharge Diagnoses: Term Pregnancy-delivered and Preelampsia  Discharge Information: Date: 12/08/2015 Activity: pelvic rest Diet: routine Medications: PNV, Ibuprofen and Procardia, labetalol Condition: stable Instructions: refer to practice specific booklet Discharge to: home   Newborn Data: Live born female  Birth Weight: 6 lb 15.2 oz (3153 g) APGAR: 2, 6  Home with mother.  Krystal Edwards 12/08/2015, 9:23 AM

## 2015-12-08 NOTE — Progress Notes (Signed)
Discharge instructions reviewed with patient.  Patient states understanding of home care for self and baby, medications, activity, signs/symptoms to report to MD and return MD office visit.  Patients significant other and family will assist with her care and baby's care @ home.  No home  equipment needed, patient has prescriptions and all personal belongings.  Patient ambulated for discharge in stable condition with staff without incident.

## 2015-12-08 NOTE — Discharge Instructions (Signed)
Call MD for T>100.4, heavy vaginal bleeding, severe abdominal pain, severe headache or respiratory distress.  Call office to schedule incision check in 1 week.  Pelvic rest x 6 weeks.

## 2015-12-08 NOTE — Progress Notes (Signed)
Post Partum Day 4 Subjective: no complaints, up ad lib, voiding, tolerating PO, + flatus and no HA, CP/SOB, RUQ pain, or visual disturbance.    Objective: BP 136/82 T 98.6 HR 93 RR 18 SpO2 98%  Physical Exam:  General: alert, cooperative and appears stated age Lochia: appropriate Uterine Fundus: firm Incision: healing well, no significant drainage, no dehiscence DVT Evaluation: No evidence of DVT seen on physical exam. Negative Homan's sign. 1+ DTRs No cords or calf tenderness.  PreE labs wnl  Assessment/Plan: Breastfeeding  PreE-BP well controlled with Procardia 60 xl bid, labetalol 400 tid.  F/U in office for BP check in 1 week. Meeting all postpartum goals.  Linda Hedges, DO

## 2015-12-31 MED FILL — BUTALB-ACETAMIN-CAFF 50-325: 50-325-40 | 7 days supply | Qty: 60 | Fill #0

## 2016-01-14 LAB — HM PAP SMEAR: HM PAP: NEGATIVE

## 2016-02-12 ENCOUNTER — Ambulatory Visit (INDEPENDENT_AMBULATORY_CARE_PROVIDER_SITE_OTHER): Payer: Managed Care, Other (non HMO) | Admitting: Neurology

## 2016-02-12 ENCOUNTER — Encounter: Payer: Self-pay | Admitting: Neurology

## 2016-02-12 VITALS — BP 150/70 | HR 84 | Resp 16 | Ht 67.0 in | Wt 258.0 lb

## 2016-02-12 DIAGNOSIS — M5481 Occipital neuralgia: Secondary | ICD-10-CM

## 2016-02-12 DIAGNOSIS — R51 Headache: Secondary | ICD-10-CM | POA: Diagnosis not present

## 2016-02-12 DIAGNOSIS — M542 Cervicalgia: Secondary | ICD-10-CM

## 2016-02-12 DIAGNOSIS — R519 Headache, unspecified: Secondary | ICD-10-CM

## 2016-02-12 MED ORDER — IMIPRAMINE HCL 25 MG PO TABS: 25.0000 mg | ORAL_TABLET | Freq: Every day | ORAL | 3 refills | Status: DC

## 2016-02-12 MED ORDER — NARATRIPTAN HCL 2.5 MG PO TABS: ORAL_TABLET | ORAL | 0 refills | Status: DC

## 2016-02-12 MED ORDER — ETODOLAC 400 MG PO TABS: 400.0000 mg | ORAL_TABLET | Freq: Two times a day (BID) | ORAL | 0 refills | Status: DC

## 2016-02-12 MED FILL — ETODOLAC 400 MG TABLET: 400 | 30 days supply | Qty: 60 | Fill #0

## 2016-02-12 MED FILL — NARATRIPTAN HCL 2.5 MG TAB: 2.5 | 21 days supply | Qty: 6 | Fill #0

## 2016-02-12 MED FILL — IMIPRAMINE HCL 25 MG TABLET: 25 | 30 days supply | Qty: 30 | Fill #0

## 2016-02-12 NOTE — Progress Notes (Signed)
[facility]  PATIENT: Krystal Edwards DOB: 1986/02/11  REFERRING DOCTOR OR PCP:  Marylynn Pearson ([facility]) SOURCE: notes form Dr. Julien Girt and associates, lab values  _________________________________   HISTORICAL  CHIEF COMPLAINT:  Chief Complaint  Patient presents with  . Migraines    She delivered her dtr. 2 mos. ago.  Sts. since delivery, h/a's are same frequency (mult. h/a's daily), and severity is worse.  Sts. she is now taking Gabriel Earing Powder twice daily./fim    HISTORY OF PRESENT ILLNESS:  Krystal Edwards is a 30 year old woman who began to experience headaches while pregnant.   She delivered her daughter about 2 months ago and is interested in starting medication to help reduce the frequency and severity.   Headaches are predominantly on the right in the temporal region. Quality of the headache is pounding when it is more severe.  She has had some nausea but no vomiting.   Headaches are daily.    Pain is 24/7 and is present upon awakening. The headache worse with moving, bright lights and loud noises. Generally her headache is also worse when she is at work.   Goody powders help for a few hours only.    She sometimes will have some spots in her vision but usually the vision is not affected. She has glasses that she wears most of the time. She denies any slurred speech, numbness or weakness. She denies any change in bladder. She has had some constipation.  Before the pregnancy, headaches were rare. When she was a high school she will occasionally get a headache and take an Excedrin and the headache would disappear. She never had a headache that lasted more than one day.   She reports the past, an MRI showed a possible pituitary tumor.    She was unable to do a follow up MRI due to claustophobia.       REVIEW OF SYSTEMS: Constitutional: No fevers, chills, sweats, or change in appetite Eyes: No visual changes, double vision, eye pain Ear, nose  and throat: No hearing loss, ear pain, nasal congestion, sore throat Cardiovascular: No chest pain, palpitations Respiratory: No shortness of breath at rest or with exertion.   No wheezes GastrointestinaI: No nausea, vomiting, diarrhea, abdominal pain, fecal incontinence.   Notes constipation Genitourinary: No dysuria, urinary retention or frequency.  No nocturia. Musculoskeletal: No neck pain, back pain Integumentary: No rash, pruritus, skin lesions Neurological: as above Psychiatric: No depression at this time.  No anxiety Endocrine: No palpitations, diaphoresis, change in appetite, change in weigh or increased thirst Hematologic/Lymphatic: No anemia, purpura, petechiae. Allergic/Immunologic: No itchy/runny eyes, nasal congestion, recent allergic reactions, rashes  ALLERGIES: No Known Allergies  HOME MEDICATIONS:  Current Outpatient Prescriptions:  .  Aspirin-Acetaminophen-Caffeine (GOODY HEADACHE PO), Take by mouth., Disp: , Rfl:  .  acetaminophen (TYLENOL) 500 MG tablet, Take 1,500 mg by mouth every 4 (four) hours as needed for mild pain or moderate pain., Disp: , Rfl:  .  etodolac (LODINE) 400 MG tablet, Take 1 tablet (400 mg total) by mouth 2 (two) times daily., Disp: 60 tablet, Rfl: 0 .  imipramine (TOFRANIL) 25 MG tablet, Take 1 tablet (25 mg total) by mouth at bedtime., Disp: 30 tablet, Rfl: 3 .  labetalol (NORMODYNE) 200 MG tablet, Take 2 tablets (400 mg total) by mouth 3 (three) times daily. (Patient not taking: Reported on 02/12/2016), Disp: 90 tablet, Rfl: 1 .  naratriptan (AMERGE) 2.5 MG tablet, One po bid x 3 days, Disp:  6 tablet, Rfl: 0 .  NIFEdipine (PROCARDIA-XL/ADALAT CC) 60 MG 24 hr tablet, Take 1 tablet (60 mg total) by mouth every 12 (twelve) hours. (Patient not taking: Reported on 02/12/2016), Disp: 60 tablet, Rfl: 1  PAST MEDICAL HISTORY: Past Medical History:  Diagnosis Date  . Carpal tunnel syndrome 03-2011   BILATERAL RECEIVES INJECTIONS  . Headache   .  Oligo-ovulation     PAST SURGICAL HISTORY: Past Surgical History:  Procedure Laterality Date  . BREAST SURGERY  2009   left breast bx-benign    FAMILY HISTORY: Family History  Problem Relation Age of Onset  . Breast cancer Mother 56    2005 AND 2013  . Diabetes Father   . Diabetes Maternal Grandfather   . Breast cancer Paternal Grandmother   . Breast cancer Paternal Grandfather     SOCIAL HISTORY:  Social History   Social History  . Marital status: Single    Spouse name: n/a  . Number of children: 0  . 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 Smoking status: Current Every Day Smoker    Packs/day: 0.25    Years: 9.00    Types: Cigarettes    Last attempt to quit: 01/20/2014  . Smokeless tobacco: Never Used  . Alcohol use No  . Drug use: No  . Sexual activity: Yes    Partners: Male    Birth control/ protection: None   Other Topics Concern  . Not on file   Social History Narrative   Lives with father and brother. Mother lives with her sister, also in [place].     PHYSICAL EXAM  Vitals:   02/12/16 1451  BP: (!) 150/70  Pulse: 84  Resp: 16  Weight: 258 lb (117 kg)  Height: 5\' 7"  (1.702 m)    Body mass index is 40.41 kg/m.   General: The patient is well-developed and well-nourished and in no acute distress  Eyes:  Funduscopic exam shows normal optic discs and retinal vessels.  Neck: The neck is supple, no carotid bruits are noted.  The neck is slightly tender at the occiput over the splenius capitis muscle    Neurologic Exam  Mental status: The patient is alert and oriented x 3 at the time of the examination. The patient has apparent normal recent and remote memory, with an apparently normal attention span and concentration ability.   Speech is normal.  Cranial nerves: Extraocular movements are full. Pupils are equal, round, and reactive to light and accomodation.   Facial  symmetry is present. There is good facial sensation to soft touch bilaterally.Facial strength is normal.  Trapezius and sternocleidomastoid strength is normal. No dysarthria is noted.  The tongue is midline, and the patient has symmetric elevation of the soft palate. No obvious hearing deficits are noted.  Motor:  Muscle bulk is normal.   Tone is normal. Strength is  5 / 5 in all 4 extremities.   Sensory: Sensory testing is intact to  soft touch in all 4 extremities.  Coordination: Cerebellar testing reveals good finger-nose-finger bilaterally.  Gait and station: Station is normal.   Gait is normal. Tandem gait is normal. Romberg is negative.    Reflexes: Deep tendon reflexes are symmetric and normal bilaterally.       DIAGNOSTIC DATA (LABS, IMAGING, TESTING) - I reviewed patient records, labs, notes, testing and imaging myself where available.  Lab Results  Component Value Date  WBC 11.5 (H) 12/08/2015   HGB 8.7 (L) 12/08/2015   HCT 26.7 (L) 12/08/2015   MCV 82.2 12/08/2015   PLT 275 12/08/2015      Component Value Date/Time   NA 136 12/08/2015 0524   K 3.5 12/08/2015 0524   CL 103 12/08/2015 0524   CO2 24 12/08/2015 0524   GLUCOSE 110 (H) 12/08/2015 0524   BUN 10 12/08/2015 0524   CREATININE 0.80 12/08/2015 0524   CREATININE 0.81 07/06/2013 0935   CALCIUM 8.8 (L) 12/08/2015 0524   PROT 6.6 12/08/2015 0524   ALBUMIN 2.8 (L) 12/08/2015 0524   AST 50 (H) 12/08/2015 0524   ALT 41 12/08/2015 0524   ALKPHOS 66 12/08/2015 0524   BILITOT 0.4 12/08/2015 0524   GFRNONAA >60 12/08/2015 0524   GFRAA >60 12/08/2015 0524    Lab Results  Component Value Date   TSH 1.416 07/06/2013       ASSESSMENT AND PLAN  Occipital neuralgia of right side  Chronic daily headache  Neck pain     1.   Naratriptan bid x 3 days 2.   Imipramine 25 mg po qHS and etodolac 400 mg po bid 3.   If no better in 2-3 weeks, she should call 4.   rtc 4 months if better, sooner if not  improving  Krystal Edwards A. Felecia Shelling, MD, PhD [id number], [id number] PM Certified in Neurology, Clinical Neurophysiology, Sleep Medicine, Pain Medicine and Neuroimaging  [facility] [address], [place], [place] [address] [phone]

## 2016-02-17 ENCOUNTER — Telehealth: Payer: Self-pay | Admitting: Neurology

## 2016-02-17 NOTE — Telephone Encounter (Signed)
02-12-16 ov note faxed to [facility] as requested/fim

## 2016-02-17 NOTE — Telephone Encounter (Signed)
Pt said the OV from 11/22 needs to be faxed to [facility] absent management @ (f) [phone]

## 2016-02-18 MED FILL — TINIDAZOLE 500 MG TABLET: 500 | 2 days supply | Qty: 8 | Fill #0

## 2016-02-19 ENCOUNTER — Telehealth: Payer: Self-pay | Admitting: *Deleted

## 2016-02-19 NOTE — Telephone Encounter (Signed)
Pt last office note faxed to [facility] on 02/19/16

## 2016-02-27 ENCOUNTER — Emergency Department (HOSPITAL_COMMUNITY)
Admission: EM | Admit: 2016-02-27 | Discharge: 2016-02-27 | Disposition: A | Discharge status: Home / Self Care | Payer: Managed Care, Other (non HMO) | Attending: Emergency Medicine | Admitting: Emergency Medicine

## 2016-02-27 ENCOUNTER — Encounter (HOSPITAL_COMMUNITY): Payer: Self-pay | Admitting: Emergency Medicine

## 2016-02-27 ENCOUNTER — Emergency Department (HOSPITAL_COMMUNITY): Payer: Managed Care, Other (non HMO)

## 2016-02-27 DIAGNOSIS — Z7982 Long term (current) use of aspirin: Secondary | ICD-10-CM | POA: Insufficient documentation

## 2016-02-27 DIAGNOSIS — Y939 Activity, unspecified: Secondary | ICD-10-CM | POA: Insufficient documentation

## 2016-02-27 DIAGNOSIS — S63501A Unspecified sprain of right wrist, initial encounter: Secondary | ICD-10-CM | POA: Insufficient documentation

## 2016-02-27 DIAGNOSIS — Y929 Unspecified place or not applicable: Secondary | ICD-10-CM | POA: Diagnosis not present

## 2016-02-27 DIAGNOSIS — F17210 Nicotine dependence, cigarettes, uncomplicated: Secondary | ICD-10-CM | POA: Insufficient documentation

## 2016-02-27 DIAGNOSIS — S6991XA Unspecified injury of right wrist, hand and finger(s), initial encounter: Secondary | ICD-10-CM | POA: Diagnosis present

## 2016-02-27 DIAGNOSIS — Y999 Unspecified external cause status: Secondary | ICD-10-CM | POA: Diagnosis not present

## 2016-02-27 DIAGNOSIS — X500XXA Overexertion from strenuous movement or load, initial encounter: Secondary | ICD-10-CM | POA: Diagnosis not present

## 2016-02-27 MED ORDER — NAPROXEN 500 MG PO TABS: 500.0000 mg | ORAL_TABLET | Freq: Two times a day (BID) | ORAL | 0 refills | Status: DC

## 2016-02-27 MED ORDER — IBUPROFEN 800 MG PO TABS
800.0000 mg | ORAL_TABLET | Freq: Once | ORAL | Status: AC
  Administered 2016-02-27: 800 mg via ORAL
  Filled 2016-02-27: qty 1

## 2016-02-27 NOTE — ED Triage Notes (Signed)
Patient reports pain at right wrist after lifting her baby yesterday .

## 2016-02-27 NOTE — ED Notes (Signed)
Unable to sign e signature due to pad not working  

## 2016-02-27 NOTE — ED Provider Notes (Signed)
[facility] DEPT Provider Note   CSN: [id number] Arrival date & time: 02/27/16  0229     History   Chief Complaint Chief Complaint  Patient presents with  . Wrist Pain    HPI Krystal Edwards is a 30 y.o. female.  She had carpal tunnel syndrome in the right wrist several years ago and was treated with a steroid injection. She had been doing well until she delivered a baby 3 months ago and has been having aching in her right wrist since then. Tonight, she picked up her baby and felt something pop in her wrist. She's complaining pain on radial side of the wrist. Pain is severe and she moves her wrist but an   The history is provided by the patient.    Past Medical History:  Diagnosis Date  . Carpal tunnel syndrome 03-2011   BILATERAL RECEIVES INJECTIONS  . Headache   . Oligo-ovulation     Patient Active Problem List   Diagnosis Date Noted  . Chronic daily headache 02/12/2016  . Pre-eclampsia 12/02/2015  . Headache in pregnancy 08/15/2015  . Neck pain 08/15/2015  . Occipital neuralgia of right side 08/15/2015  . Echogenic intracardiac focus of fetus on prenatal ultrasound 08/06/2015  . Choroid plexus cyst of fetus 08/06/2015  . Laryngopharyngeal reflux (LPR) 02/26/2014  . Cough 02/26/2014  . Hoarseness or changing voice 02/26/2014    Past Surgical History:  Procedure Laterality Date  . BREAST SURGERY  2009   left breast bx-benign    OB History    Gravida Para Term Preterm AB Living   3 1 1   2 1    SAB TAB Ectopic Multiple Live Births   2     0 1       Home Medications    Prior to Admission medications   Medication Sig Start Date End Date Taking? Authorizing Provider  acetaminophen (TYLENOL) 500 MG tablet Take 1,500 mg by mouth every 4 (four) hours as needed for mild pain or moderate pain.    Historical Provider, MD  Aspirin-Acetaminophen-Caffeine (GOODY HEADACHE PO) Take by mouth.    Historical Provider, MD  etodolac (LODINE) 400 MG tablet Take 1  tablet (400 mg total) by mouth 2 (two) times daily. 02/12/16   Britt Bottom, MD  imipramine (TOFRANIL) 25 MG tablet Take 1 tablet (25 mg total) by mouth at bedtime. 02/12/16   Britt Bottom, MD  labetalol (NORMODYNE) 200 MG tablet Take 2 tablets (400 mg total) by mouth 3 (three) times daily. Patient not taking: Reported on 02/12/2016 12/08/15   Linda Hedges, DO  naproxen (NAPROSYN) 500 MG tablet Take 1 tablet (500 mg total) by mouth 2 (two) times daily. 0000000   Delora Fuel, MD  naratriptan (AMERGE) 2.5 MG tablet One po bid x 3 days 02/12/16   Britt Bottom, MD  NIFEdipine (PROCARDIA-XL/ADALAT CC) 60 MG 24 hr tablet Take 1 tablet (60 mg total) by mouth every 12 (twelve) hours. Patient not taking: Reported on 02/12/2016 12/08/15   Linda Hedges, DO    Family History Family History  Problem Relation Age of Onset  . Breast cancer Mother 48    2005 AND 2013  . Diabetes Father   . Diabetes Maternal Grandfather   . Breast cancer Paternal Grandmother   . Breast cancer Paternal Grandfather     Social History Social History  Substance Use Topics  . Smoking status: Current Every Day Smoker    Packs/day: 0.25    Years: 9.00  Types: Cigarettes    Last attempt to quit: 01/20/2014  . Smokeless tobacco: Never Used  . Alcohol use No     Allergies   Patient has no known allergies.   Review of Systems Review of Systems  All other systems reviewed and are negative.    Physical Exam Updated Vital Signs BP 162/93 (BP Location: Right Arm)   Pulse 92   Temp 97.9 F (36.6 C) (Oral)   Resp 22   Ht 5\' 9"  (1.753 m)   Wt 264 lb (119.7 kg)   SpO2 99%   BMI 38.99 kg/m   Physical Exam  Nursing note and vitals reviewed.  30 year old female, resting comfortably and in no acute distress. Vital signs are Significant for hypertension and tachypnea. Oxygen saturation is 99%, which is normal. Head is normocephalic and atraumatic. PERRLA, EOMI. Oropharynx is clear. Neck is nontender and  supple without adenopathy or JVD. Back is nontender and there is no CVA tenderness. Lungs are clear without rales, wheezes, or rhonchi. Chest is nontender. Heart has regular rate and rhythm without murmur. Abdomen is soft, flat, nontender without masses or hepatosplenomegaly and peristalsis is normoactive. Extremities: There is no swelling or deformity of the right wrist. There is moderate tenderness over the radial aspect of the right wrist. No tenderness in the middle or on the ulnar side. Our wasting. Normal pincer grasp strength. No sensory deficit. Skin is warm and dry without rash. Neurologic: Mental status is normal, cranial nerves are intact, there are no motor or sensory deficits.  ED Treatments / Results   Radiology Dg Wrist Complete Right  Result Date: 02/27/2016 CLINICAL DATA:  Right wrist pain for 2 years. Worsened for the past day after lifting. EXAM: RIGHT WRIST - COMPLETE 3+ VIEW COMPARISON:  None. FINDINGS: Negative for acute fracture or dislocation. No significant arthritic changes. There is incomplete segmentation of the triquetrum and lunate, an anatomic variant. There is a mild negative ulnar variance. IMPRESSION: No acute findings.  No significant arthropathic changes. Electronically Signed   By: Andreas Newport M.D.   On: 02/27/2016 03:14    Procedures Procedures (including critical care time)  Medications Ordered in ED Medications  ibuprofen (ADVIL,MOTRIN) tablet 800 mg (not administered)     Initial Impression / Assessment and Plan / ED Course  I have reviewed the triage vital signs and the nursing notes.  Pertinent imaging results that were available during my care of the patient were reviewed by me and considered in my medical decision making (see chart for details).  Clinical Course    Right wrist pain. No evidence of carpal tunnel syndrome on my exam. Suspect mild sprain. She is given a wrist splint for comfort and is referred to hand surgery for  follow-up. Prescription given for naproxen. Old records are reviewed, and she has no relevant past visits.  Final Clinical Impressions(s) / ED Diagnoses   Final diagnoses:  Sprain of right wrist, initial encounter    New Prescriptions New Prescriptions   NAPROXEN (NAPROSYN) 500 MG TABLET    Take 1 tablet (500 mg total) by mouth 2 (two) times daily.     Delora Fuel, MD [id number]

## 2016-02-27 NOTE — Discharge Instructions (Signed)
Wear the splint as needed.

## 2016-04-01 ENCOUNTER — Telehealth: Payer: Self-pay | Admitting: Neurology

## 2016-04-01 NOTE — Telephone Encounter (Signed)
Pt is wanting to be released to return to work

## 2016-04-01 NOTE — Telephone Encounter (Signed)
I have spoken with pt. and advised that she may return to work if she feels well enough to do so.  Dr. Felecia Shelling did not put her out of work--she may have been out on maternity leave--we did not complete any fmla paperwork for her/fim

## 2016-04-06 ENCOUNTER — Telehealth: Payer: Self-pay | Admitting: Neurology

## 2016-04-06 NOTE — Telephone Encounter (Signed)
Patient requesting a Return to Work letter - [employer] is the employer. She is requesting to return to work part-time 4 hours daily. Her best phone number is [phone]

## 2016-04-06 NOTE — Telephone Encounter (Signed)
I have spoken with Krystal Edwards this afternoon.  Pt. has not been seen since 02-12-16, and was instructed to call back if not better, which she didn't.  [employer] did not put her out of work.  She now would like to return to work, 4 hrs. per day. She sts. her OB/GYN put her out, completed FMLA paperwork for her. I have explained that, as she as not been seen since 02-11-17, we have no documentation to justify her being out of work, and none to justify continued decrease in work hours. She will take FMLA paperwork to her OB/GYN/fim

## 2016-04-14 ENCOUNTER — Telehealth: Payer: Self-pay

## 2016-04-14 NOTE — Telephone Encounter (Signed)
Rec'd records from [facility] forward 18 pages  to Neurology Historical Provider

## 2016-04-17 ENCOUNTER — Encounter: Payer: Self-pay | Admitting: Neurology

## 2016-04-17 ENCOUNTER — Ambulatory Visit (INDEPENDENT_AMBULATORY_CARE_PROVIDER_SITE_OTHER): Payer: Managed Care, Other (non HMO) | Admitting: Neurology

## 2016-04-17 VITALS — BP 134/86 | HR 72 | Ht 69.0 in | Wt 272.0 lb

## 2016-04-17 DIAGNOSIS — G43719 Chronic migraine without aura, intractable, without status migrainosus: Secondary | ICD-10-CM

## 2016-04-17 DIAGNOSIS — D352 Benign neoplasm of pituitary gland: Secondary | ICD-10-CM

## 2016-04-17 DIAGNOSIS — H538 Other visual disturbances: Secondary | ICD-10-CM

## 2016-04-17 MED ORDER — TOPIRAMATE 50 MG PO TABS: 50.0000 mg | ORAL_TABLET | Freq: Every day | ORAL | 2 refills | Status: DC

## 2016-04-17 MED FILL — TOPIRAMATE 50 MG TABLET: 50 | 30 days supply | Qty: 30 | Fill #0

## 2016-04-17 NOTE — Patient Instructions (Signed)
Migraine Recommendations: 1.  We will start topiramate (Topamax) 50mg  at bedtime. Possible side effects include: impaired thinking, sedation, paresthesias (numbness and tingling) and weight loss.  It may cause dehydration and there is a small risk for kidney stones, so make sure to stay hydrated with water during the day.  There is also a very small risk for glaucoma, so if you notice any change in your vision while taking this medication, see an ophthalmologist.  2.  Stop Tylenol.  Instead, use ibuprofen.  Limit to no more than 2 days out of the week. 3.  Limit use of pain relievers to no more than 2 days out of the week.  These medications include acetaminophen, ibuprofen, triptans and narcotics.  This will help reduce risk of rebound headaches. 4.  Be aware of common food triggers such as processed sweets, processed foods with nitrites (such as deli meat, hot dogs, sausages), foods with MSG, alcohol (such as wine), chocolate, certain cheeses, certain fruits (dried fruits, some citrus fruit), vinegar, diet soda. 4.  Avoid caffeine 5.  Routine exercise 6.  Proper sleep hygiene 7.  Stay adequately hydrated with water 8.  Keep a headache diary. 9.  Maintain proper stress management. 10.  Do not skip meals. 11.  Consider supplements:  Magnesium citrate 400mg  to 600mg  daily, riboflavin 400mg , Coenzyme Q 10 100mg  three times daily 12.  We will refer you to an eye doctor. 13.  Follow up in 3 months but contact us in 4 weeks with update and we can increase dose of topamax if needed.   Recurrent Migraine Headache A migraine headache is very bad, throbbing pain on one or both sides of your head. Recurrent migraines keep coming back. Talk to your doctor about what things may bring on (trigger) your migraine headaches. Follow these instructions at home:  Only take medicines as told by your doctor.  Lie down in a dark, quiet room when you have a migraine.  Keep a journal to find out if certain things  bring on migraine headaches. For example, write down:  What you eat and drink.  How much sleep you get.  Any change to your diet or medicines.  Lessen how much alcohol you drink.  Quit smoking if you smoke.  Get enough sleep.  Lessen any stress in your life.  Keep lights dim if bright lights bother you or make your migraines worse. Contact a doctor if:  Medicine does not help your migraines.  Your pain keeps coming back.  You have a fever. Get help right away if:  Your migraine becomes really bad.  You have a stiff neck.  You have trouble seeing.  Your muscles are weak, or you lose muscle control.  You lose your balance or have trouble walking.  You feel like you will pass out (faint), or you pass out.  You have really bad symptoms that are different than your first symptoms. This information is not intended to replace advice given to you by your health care provider. Make sure you discuss any questions you have with your health care provider. Document Released: 12/17/2007 Document Revised: 08/15/2015 Document Reviewed: 11/14/2012 Elsevier Interactive Patient Education  2017 [employer].

## 2016-04-17 NOTE — Progress Notes (Signed)
NEUROLOGY CONSULTATION NOTE  Krystal Edwards MRN: [id number] DOB: Jan 05, 1986  Referring provider: Dr. Felecia Shelling Primary care provider: Dr. Gaetano Net  Reason for consult:  headache  HISTORY OF PRESENT ILLNESS: Krystal Edwards is a 31 year old right-handed female who presents for headaches.  She is accompanied by her fiance who supplements history.  Onset:  January 2017, during her pregnancy.  She delivered her daughter in September, but headache persists. Location:  right temporal Quality:  Pressure/throbbing but pounding when severe Intensity:  5/10 but 10/10 when severe Aura:  no Prodrome:  no Associated symptoms:  Dizziness.  Photophobia.  Since giving birth, rarely nausea, sometimes spots in her vision.  No vomiting.. She has not had any new worse headache of her life, associated slurred speech or unilateral numbness or weakness.  She does wake up with headache but the headache doesn't wake her up from sleep Duration:  Constant but severe fluctuations lasts for several hours Frequency:  Daily but severe fluctuations occur in morning and evening. Frequency of abortive medication: Takes Tylenol daily Triggers/exacerbating factors:  bright lights, loud noises, moving Relieving factors:  Ice pack Activity:  2 days a week she cannot get out of bed.  Past NSAIDS:  etodolac 400mg  twice daily, naproxen 500mg  Past analgesics:  Fioricet, Goody Past abortive triptans:  naratriptan for 3 days Past muscle relaxants:  no Past anti-emetic:  promethazine, Reglan Past antihypertensive medications:  labetolol, nifedipine Past antidepressant medications:  nortriptyline 10mg , imipramine 25mg  Past anticonvulsant medications:  oxcarbazepine 150mg  twice daily Past vitamins/Herbal/Supplements:  no Other past therapies:  no  Current NSAIDS:  no Current analgesics:  Tylenol Current triptans:  no Current anti-emetic:  no Current muscle relaxants:  no Current anti-anxiolytic:  no Current sleep aide:   no Current Antihypertensive medications:  no Current Antidepressant medications:  no Current Anticonvulsant medications:  no Current Vitamins/Herbal/Supplements:  no Current Antihistamines/Decongestants:  no Other therapy:  no  Caffeine:  Cola daily Alcohol:  no Smoker:  no Diet:  Needs to increase water intake.  Drinks cola. Exercise:  no Depression/anxiety:  no Sleep hygiene:  Poor due to caring for baby and headache No prior history of migraines.  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  She was unable to get an MRI with contrast following her daughter's birth due to claustrophobia.  She denies visual field loss.  She has some blurred vision, but she attributes it to having lost her glasses back in June. Remote CT of head from 01/06/13 was personally reviewed and was normal. 12/08/15 Labs:  CMP with Na 136, K 3.5, glucose 110, BUN 10, Cr 0.80, total bili 0.4, ALP 66, AST 50 and ALT 41.Marland Kitchen  PAST MEDICAL HISTORY: Past Medical History:  Diagnosis Date  . Carpal tunnel syndrome 03-2011   BILATERAL RECEIVES INJECTIONS  . Headache   . Oligo-ovulation     PAST SURGICAL HISTORY: Past Surgical History:  Procedure Laterality Date  . BREAST SURGERY  2009   left breast bx-benign    MEDICATIONS: Current Outpatient Prescriptions on File Prior to Visit  Medication Sig Dispense Refill  . acetaminophen (TYLENOL) 500 MG tablet Take 1,500 mg by mouth every 4 (four) hours as needed for mild pain or moderate pain.    . Aspirin-Acetaminophen-Caffeine (GOODY HEADACHE PO) Take by mouth.    . etodolac (LODINE) 400 MG tablet Take 1 tablet (400 mg total) by mouth 2 (two) times daily. (Patient not taking: Reported  on 04/17/2016) 60 tablet 0  . imipramine (TOFRANIL) 25 MG tablet Take 1 tablet (25 mg total) by mouth at bedtime. (Patient not taking: Reported on 04/17/2016) 30 tablet 3  . labetalol (NORMODYNE) 200 MG tablet  Take 2 tablets (400 mg total) by mouth 3 (three) times daily. (Patient not taking: Reported on 02/12/2016) 90 tablet 1  . naproxen (NAPROSYN) 500 MG tablet Take 1 tablet (500 mg total) by mouth 2 (two) times daily. (Patient not taking: Reported on 04/17/2016) 30 tablet 0  . naratriptan (AMERGE) 2.5 MG tablet One po bid x 3 days (Patient not taking: Reported on 04/17/2016) 6 tablet 0  . NIFEdipine (PROCARDIA-XL/ADALAT CC) 60 MG 24 hr tablet Take 1 tablet (60 mg total) by mouth every 12 (twelve) hours. (Patient not taking: Reported on 02/12/2016) 60 tablet 1   No current facility-administered medications on file prior to visit.     ALLERGIES: No Known Allergies  FAMILY HISTORY: Family History  Problem Relation Age of Onset  . Breast cancer Mother 65    2005 AND 2013  . Diabetes Father   . Diabetes Maternal Grandfather   . Breast cancer Paternal Grandmother   . Breast cancer Paternal Grandfather     SOCIAL HISTORY: Social History   Social History  . Marital status: Single    Spouse name: n/a  . Number of children: 0  . 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 Smoking status: Current Every Day Smoker    Packs/day: 0.25    Years: 9.00    Types: Cigarettes    Last attempt to quit: 01/20/2014  . Smokeless tobacco: Never Used  . Alcohol use No  . Drug use: No  . Sexual activity: Yes    Partners: Male    Birth control/ protection: None   Other Topics Concern  . Not on file   Social History Narrative   Lives with father and brother. Mother lives with her sister, also in [place].    REVIEW OF SYSTEMS: Constitutional: No fevers, chills, or sweats, no generalized fatigue, change in appetite Eyes: No visual changes, double vision, eye pain Ear, nose and throat: No hearing loss, ear pain, nasal congestion, sore throat Cardiovascular: No chest pain, palpitations Respiratory:  No shortness of breath at rest  or with exertion, wheezes GastrointestinaI: No nausea, vomiting, diarrhea, abdominal pain, fecal incontinence Genitourinary:  No dysuria, urinary retention or frequency Musculoskeletal:  No neck pain, back pain Integumentary: No rash, pruritus, skin lesions Neurological: as above Psychiatric: No depression, insomnia, anxiety Endocrine: No palpitations, fatigue, diaphoresis, mood swings, change in appetite, change in weight, increased thirst Hematologic/Lymphatic:  No purpura, petechiae. Allergic/Immunologic: no itchy/runny eyes, nasal congestion, recent allergic reactions, rashes  PHYSICAL EXAM: Vitals:   04/17/16 1502  BP: 134/86  Pulse: 72   General: No acute distress.  Patient appears well-groomed.  Head:  Normocephalic/atraumatic Eyes:  fundi examined but not visualized Neck: supple, no paraspinal tenderness, full range of motion Back: No paraspinal tenderness Heart: regular rate and rhythm Lungs: Clear to auscultation bilaterally. Vascular: No carotid bruits. Neurological Exam: Mental status: alert and oriented to person, place, and time, recent and remote memory intact, fund of knowledge intact, attention and concentration intact, speech fluent and not dysarthric, language intact. Cranial nerves: CN I: not tested CN II: pupils equal, round and reactive to light, visual fields intact CN III, IV, VI:  full range of motion, no nystagmus,  no ptosis CN V: facial sensation intact CN VII: upper and lower face symmetric CN VIII: hearing intact CN IX, X: gag intact, uvula midline CN XI: sternocleidomastoid and trapezius muscles intact CN XII: tongue midline Bulk & Tone: normal, no fasciculations. Motor:  5/5 throughout  Sensation: temperature and vibration sensation intact. Deep Tendon Reflexes:  2+ throughout, toes downgoing.  Finger to nose testing:  Without dysmetria.  Heel to shin:  Without dysmetria.  Gait:  Normal station and stride.  Able to turn and tandem walk.  Romberg negative.Marland Kitchen  IMPRESSION: Chronic migraine, complicated by medication overuse Morbid obsesity (BMI 40.17)  PLAN: 1.  We will start topiramate (Topamax) 50mg  at bedtime.  Side effects discussed, including taking precautions not to get pregnant. 2.  Stop Tylenol.  Instead, use ibuprofen.  Limit to no more than 2 days out of the week. 3. Be aware of common food triggers such as processed sweets, processed foods with nitrites (such as deli meat, hot dogs, sausages), foods with MSG, alcohol (such as wine), chocolate, certain cheeses, certain fruits (dried fruits, some citrus fruit), vinegar, diet soda. 4.  Avoid caffeine 5.  Routine exercise 6.  Proper sleep hygiene 7.  Stay adequately hydrated with water 8.  Keep a headache diary. 9.  Maintain proper stress management. 10.  Do not skip meals. 11.  Consider supplements:  Magnesium citrate 400mg  to 600mg  daily, riboflavin 400mg , Coenzyme Q 10 100mg  three times daily 12.  Referral to ophthalmology to assess vision. 13.  Follow up in 3 months but contact us in 4 weeks with update and we can increase dose of topamax if needed.  Thank you for allowing me to take part in the care of this patient.  Metta Clines, DO  CC: [name], MD         Arlice Colt, MD

## 2016-05-07 DIAGNOSIS — Z0289 Encounter for other administrative examinations: Secondary | ICD-10-CM

## 2016-06-16 DIAGNOSIS — Z0271 Encounter for disability determination: Secondary | ICD-10-CM

## 2016-06-17 ENCOUNTER — Ambulatory Visit: Payer: Self-pay | Admitting: Neurology

## 2016-06-18 ENCOUNTER — Ambulatory Visit: Payer: Managed Care, Other (non HMO) | Admitting: Neurology

## 2016-06-22 ENCOUNTER — Encounter: Payer: Self-pay | Admitting: Neurology

## 2016-07-17 ENCOUNTER — Ambulatory Visit: Payer: Self-pay | Admitting: Neurology

## 2016-07-17 DIAGNOSIS — Z029 Encounter for administrative examinations, unspecified: Secondary | ICD-10-CM

## 2016-07-21 ENCOUNTER — Encounter: Payer: Self-pay | Admitting: Neurology

## 2016-11-25 ENCOUNTER — Ambulatory Visit (HOSPITAL_COMMUNITY)
Admission: EM | Admit: 2016-11-25 | Discharge: 2016-11-25 | Disposition: A | Discharge status: Home / Self Care | Payer: 59 | Attending: Family Medicine | Admitting: Family Medicine

## 2016-11-25 ENCOUNTER — Encounter (HOSPITAL_COMMUNITY): Payer: Self-pay | Admitting: *Deleted

## 2016-11-25 DIAGNOSIS — J069 Acute upper respiratory infection, unspecified: Secondary | ICD-10-CM

## 2016-11-25 DIAGNOSIS — B9789 Other viral agents as the cause of diseases classified elsewhere: Secondary | ICD-10-CM | POA: Diagnosis not present

## 2016-11-25 MED ORDER — IPRATROPIUM BROMIDE 0.06 % NA SOLN: 2.0000 | Freq: Four times a day (QID) | NASAL | 0 refills | Status: DC

## 2016-11-25 MED ORDER — BENZONATATE 100 MG PO CAPS: 100.0000 mg | ORAL_CAPSULE | Freq: Three times a day (TID) | ORAL | 0 refills | Status: DC

## 2016-11-25 MED FILL — BENZONATATE 100 MG CAP: 100 | 7 days supply | Qty: 21 | Fill #0

## 2016-11-25 MED FILL — IPRATROPIUM 0.06% SPRAY: 0.06 | 30 days supply | Qty: 15 | Fill #0

## 2016-11-25 NOTE — ED Provider Notes (Signed)
[id number] 11/25/16 Arrival Time: 1008   SUBJECTIVE:  Krystal Edwards is a 31 y.o. female who presents to the urgent care with complaint of achiness, congestion, coryza, cough described as nonproductive, low grade fever, nasal congestion, non productive cough, post nasal drip and sneezing for 4 days. No pertinent additional PMH such as asthma or lung disease.    Past Medical History:  Diagnosis Date  . Carpal tunnel syndrome 03-2011   BILATERAL RECEIVES INJECTIONS  . Headache   . Oligo-ovulation    Social History   Social History  . Marital status: Single    Spouse name: n/a  . Number of children: 0  . 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 Smoking status: Current Every Day Smoker    Packs/day: 0.25    Years: 9.00    Types: Cigarettes    Last attempt to quit: 01/20/2014  . Smokeless tobacco: Never Used  . Alcohol use No  . Drug use: No  . Sexual activity: Yes    Partners: Male    Birth control/ protection: None   Other Topics Concern  . Not on file   Social History Narrative   Lives with father and brother. Mother lives with her sister, also in [place].   No outpatient prescriptions have been marked as taking for the 11/25/16 encounter [facility] Encounter).   No Known Allergies    ROS: As per HPI, remainder of ROS negative.   OBJECTIVE:  Vitals:   11/25/16 1101 11/25/16 1102  BP: (!) 182/98   Pulse: 96   Resp: 18   Temp: (!) 97.3 F (36.3 C)   TempSrc: Oral   SpO2: 100%   Weight:  284 lb 9.8 oz (129.1 kg)  Height:  5\' 7"  (1.702 m)       General Appearance:  awake, alert, oriented, in no acute distress, well developed, well nourished and in no acute distress Skin:  skin color, texture, turgor are normal Head/face:  NCAT Ears:  TM- normal, Canal- normal and External- normal Nose/Sinuses:  sinuses nontender, positive findings: Rhinorrhea Mouth/Throat:  Mucosa moist, no  lesions; pharynx without erythema, edema or exudate. Back:  no pain to palpation Lungs:  Normal expansion.  Clear to auscultation.  No rales, rhonchi, or wheezing. Heart:  Heart regular rate and rhythm Abdomen:  Soft, non-tender, normal bowel sounds; no bruits, organomegaly or masses. Extremities: Extremities warm to touch, pink, with no edema. Peripheral Pulses:  Capillary refill <2secs, strong peripheral pulses Neurologic:  Alert and oriented      Labs:  Labs Reviewed - No data to display  No results found.     ASSESSMENT & PLAN:  1. Viral URI with cough     Meds ordered this encounter  Medications  . benzonatate (TESSALON) 100 MG capsule    Sig: Take 1 capsule (100 mg total) by mouth every 8 (eight) hours.    Dispense:  21 capsule    Refill:  0    Order Specific Question:   Supervising Provider    Answer:   Vanessa Kick [id number]  . ipratropium (ATROVENT) 0.06 % nasal spray    Sig: Place 2 sprays into both nostrils 4 (four) times daily.    Dispense:  15 mL    Refill:  0    Order Specific Question:   Supervising Provider    Answer:   Vanessa Kick [[id number]]   Patient is hypertensive at  182/98, has stopped her HTN medications, recommend follow up with a PCP to restart.  viral upper respiratory illness  Discussed diagnosis and treatment of URI. Discussed the importance of avoiding unnecessary antibiotic therapy. Suggested symptomatic OTC remedies. Nasal saline spray for congestion. Follow up as needed.  Reviewed expectations re: course of current medical issues. Questions answered. Outlined signs and symptoms indicating need for more acute intervention. Patient verbalized understanding. After Visit Summary given.    Procedures:        Barnet Glasgow, NP 11/25/16 1212

## 2016-11-25 NOTE — Discharge Instructions (Signed)
You most likely have a viral URI, this type of infection will not be helped by antibiotics.  For your symptoms I have prescribed For cough, I have prescribed a medication called Tessalon. Take 1 tablet every 8 hours as needed for your cough. For runny nose and prescribed ipratropium, 2 sprays each nostril up to 4 times a day.   In addition to these therapies, I advise rest, plenty of fluids and management of symptoms with over the counter medicines. Over the counter therapies for your symptoms include:Tylenol as needed every 4-6 hours for body aches or fever, not to exceed 4,000 mg a day, Take mucinex or mucinex DM ever 12 hours with a full glass of water, you may use an inhaled steroid such as Flonase, 2 sprays each nostril once a day for congestion, or an antihistamine such as Claritin or Zyrtec once a day. Another alternative for congestion, is a pseudoephedrine containing product available from the pharmacist. Should your symptoms worsen or fail to resolve, follow up with your primary care provider or return to clinic.

## 2016-11-25 NOTE — ED Triage Notes (Signed)
Pt  Reports    Cough   Sneezing runny     Worse at  Night     Body   Aches      Symptoms  X  3  Days       Child    Has   Similar  Symptoms

## 2016-11-30 ENCOUNTER — Telehealth: Payer: Self-pay | Admitting: Neurology

## 2016-11-30 NOTE — Telephone Encounter (Signed)
Fine with me, can schedule with first available thanks

## 2016-11-30 NOTE — Telephone Encounter (Signed)
Patient calling to switch physicians from Dr. Felecia Shelling to Dr. Jaynee Eagles. She says she has not gotten any results from Dr. Felecia Shelling regarding her headaches.

## 2016-12-03 ENCOUNTER — Encounter: Payer: Self-pay | Admitting: Neurology

## 2016-12-03 ENCOUNTER — Ambulatory Visit (INDEPENDENT_AMBULATORY_CARE_PROVIDER_SITE_OTHER): Payer: 59 | Admitting: Neurology

## 2016-12-03 VITALS — BP 153/90 | HR 84 | Ht 67.0 in | Wt 283.6 lb

## 2016-12-03 DIAGNOSIS — G4719 Other hypersomnia: Secondary | ICD-10-CM | POA: Diagnosis not present

## 2016-12-03 DIAGNOSIS — R51 Headache: Secondary | ICD-10-CM

## 2016-12-03 DIAGNOSIS — G4440 Drug-induced headache, not elsewhere classified, not intractable: Secondary | ICD-10-CM

## 2016-12-03 DIAGNOSIS — R0683 Snoring: Secondary | ICD-10-CM

## 2016-12-03 DIAGNOSIS — R519 Headache, unspecified: Secondary | ICD-10-CM

## 2016-12-03 MED ORDER — TIZANIDINE HCL 4 MG PO TABS: 4.0000 mg | ORAL_TABLET | Freq: Four times a day (QID) | ORAL | 6 refills | Status: DC | PRN

## 2016-12-03 MED ORDER — PROCHLORPERAZINE MALEATE 5 MG PO TABS: 5.0000 mg | ORAL_TABLET | Freq: Four times a day (QID) | ORAL | 1 refills | Status: DC | PRN

## 2016-12-03 MED FILL — PROCHLORPERAZINE 5 MG TAB: 5 | 15 days supply | Qty: 60 | Fill #0

## 2016-12-03 MED FILL — tiZANidine HCL 4 MG TABS: 4 | 15 days supply | Qty: 60 | Fill #0

## 2016-12-03 NOTE — Patient Instructions (Addendum)
- Stop daily over the counter med use (Tylenol, excedrin, ibuprofen, alleve, caffeine, etc) Do not tak emore than 2-3x in one week. Discussed medication overuse headache. -  take compazine and tizanidine as needed. Watch for sedation, do not drive unless you know how the medications affects you.   Sleep Apnea Sleep apnea is a condition in which breathing pauses or becomes shallow during sleep. Episodes of sleep apnea usually last 10 seconds or longer, and they may occur as many as 20 times an hour. Sleep apnea disrupts your sleep and keeps your body from getting the rest that it needs. This condition can increase your risk of certain health problems, including:  Heart attack.  Stroke.  Obesity.  Diabetes.  Heart failure.  Irregular heartbeat.  There are three kinds of sleep apnea:  Obstructive sleep apnea. This kind is caused by a blocked or collapsed airway.  Central sleep apnea. This kind happens when the part of the brain that controls breathing does not send the correct signals to the muscles that control breathing.  Mixed sleep apnea. This is a combination of obstructive and central sleep apnea.  What are the causes? The most common cause of this condition is a collapsed or blocked airway. An airway can collapse or become blocked if:  Your throat muscles are abnormally relaxed.  Your tongue and tonsils are larger than normal.  You are overweight.  Your airway is smaller than normal.  What increases the risk? This condition is more likely to develop in people who:  Are overweight.  Smoke.  Have a smaller than normal airway.  Are elderly.  Are female.  Drink alcohol.  Take sedatives or tranquilizers.  Have a family history of sleep apnea.  What are the signs or symptoms? Symptoms of this condition include:  Trouble staying asleep.  Daytime sleepiness and tiredness.  Irritability.  Loud snoring.  Morning headaches.  Trouble  concentrating.  Forgetfulness.  Decreased interest in sex.  Unexplained sleepiness.  Mood swings.  Personality changes.  Feelings of depression.  Waking up often during the night to urinate.  Dry mouth.  Sore throat.  How is this diagnosed? This condition may be diagnosed with:  A medical history.  A physical exam.  A series of tests that are done while you are sleeping (sleep study). These tests are usually done in a sleep lab, but they may also be done at home.  How is this treated? Treatment for this condition aims to restore normal breathing and to ease symptoms during sleep. It may involve managing health issues that can affect breathing, such as high blood pressure or obesity. Treatment may include:  Sleeping on your side.  Using a decongestant if you have nasal congestion.  Avoiding the use of depressants, including alcohol, sedatives, and narcotics.  Losing weight if you are overweight.  Making changes to your diet.  Quitting smoking.  Using a device to open your airway while you sleep, such as: ? An oral appliance. This is a custom-made mouthpiece that shifts your lower jaw forward. ? A continuous positive airway pressure (CPAP) device. This device delivers oxygen to your airway through a mask. ? A nasal expiratory positive airway pressure (EPAP) device. This device has valves that you put into each nostril. ? A bi-level positive airway pressure (BPAP) device. This device delivers oxygen to your airway through a mask.  Surgery if other treatments do not work. During surgery, excess tissue is removed to create a wider airway.  It is  important to get treatment for sleep apnea. Without treatment, this condition can lead to:  High blood pressure.  Coronary artery disease.  (Men) An inability to achieve or maintain an erection (impotence).  Reduced thinking abilities.  Follow these instructions at home:  Make any lifestyle changes that your health  care provider recommends.  Eat a healthy, well-balanced diet.  Take over-the-counter and prescription medicines only as told by your health care provider.  Avoid using depressants, including alcohol, sedatives, and narcotics.  Take steps to lose weight if you are overweight.  If you were given a device to open your airway while you sleep, use it only as told by your health care provider.  Do not use any tobacco products, such as cigarettes, chewing tobacco, and e-cigarettes. If you need help quitting, ask your health care provider.  Keep all follow-up visits as told by your health care provider. This is important. Contact a health care provider if:  The device that you received to open your airway during sleep is uncomfortable or does not seem to be working.  Your symptoms do not improve.  Your symptoms get worse. Get help right away if:  You develop chest pain.  You develop shortness of breath.  You develop discomfort in your back, arms, or stomach.  You have trouble speaking.  You have weakness on one side of your body.  You have drooping in your face. These symptoms may represent a serious problem that is an emergency. Do not wait to see if the symptoms will go away. Get medical help right away. Call your local emergency services (911 in the [place] Do not drive yourself to the hospital. This information is not intended to replace advice given to you by your health care provider. Make sure you discuss any questions you have with your health care provider. Document Released: 02/27/2002 Document Revised: 11/03/2015 Document Reviewed: 12/17/2014 Elsevier Interactive Patient Education  2018 [employer].   Prochlorperazine tablets What is this medicine? PROCHLORPERAZINE (proe klor PER a zeen) helps to control severe nausea and vomiting. This medicine is also used to treat schizophrenia. It can also help patients who experience anxiety that is not due to psychological  illness. This medicine may be used for other purposes; ask your health care provider or pharmacist if you have questions. COMMON BRAND NAME(S): Compazine What should I tell my health care provider before I take this medicine? They need to know if you have any of these conditions: -blood disorders or disease -dementia -liver disease or jaundice -Parkinson's disease -uncontrollable movement disorder -an unusual or allergic reaction to prochlorperazine, other medicines, foods, dyes, or preservatives -pregnant or trying to get pregnant -breast-feeding How should I use this medicine? Take this medicine by mouth with a glass of water. Follow the directions on the prescription label. Take your doses at regular intervals. Do not take your medicine more often than directed. Do not stop taking this medicine suddenly. This can cause nausea, vomiting, and dizziness. Ask your doctor or health care professional for advice. Talk to your pediatrician regarding the use of this medicine in children. Special care may be needed. While this drug may be prescribed for children as young as 2 years for selected conditions, precautions do apply. Overdosage: If you think you have taken too much of this medicine contact a poison control center or emergency room at once. NOTE: This medicine is only for you. Do not share this medicine with others. What if I miss a dose? If you miss  a dose, take it as soon as you can. If it is almost time for your next dose, take only that dose. Do not take double or extra doses. What may interact with this medicine? Do not take this medicine with any of the following medications: -amoxapine -antidepressants like citalopram, escitalopram, fluoxetine, paroxetine, and sertraline -deferoxamine -dofetilide -maprotiline -tricyclic antidepressants like amitriptyline, clomipramine, imipramine, nortiptyline and others This medicine may also interact with the following  medications: -lithium -medicines for pain -phenytoin -propranolol -warfarin This list may not describe all possible interactions. Give your health care provider a list of all the medicines, herbs, non-prescription drugs, or dietary supplements you use. Also tell them if you smoke, drink alcohol, or use illegal drugs. Some items may interact with your medicine. What should I watch for while using this medicine? Visit your doctor or health care professional for regular checks on your progress. You may get drowsy or dizzy. Do not drive, use machinery, or do anything that needs mental alertness until you know how this medicine affects you. Do not stand or sit up quickly, especially if you are an older patient. This reduces the risk of dizzy or fainting spells. Alcohol may interfere with the effect of this medicine. Avoid alcoholic drinks. This medicine can reduce the response of your body to heat or cold. Dress warm in cold weather and stay hydrated in hot weather. If possible, avoid extreme temperatures like saunas, hot tubs, very hot or cold showers, or activities that can cause dehydration such as vigorous exercise. This medicine can make you more sensitive to the sun. Keep out of the sun. If you cannot avoid being in the sun, wear protective clothing and use sunscreen. Do not use sun lamps or tanning beds/booths. Your mouth may get dry. Chewing sugarless gum or sucking hard candy, and drinking plenty of water may help. Contact your doctor if the problem does not go away or is severe. What side effects may I notice from receiving this medicine? Side effects that you should report to your doctor or health care professional as soon as possible: -blurred vision -breast enlargement in men or women -breast milk in women who are not breast-feeding -chest pain, fast or irregular heartbeat -confusion, restlessness -dark yellow or brown urine -difficulty breathing or swallowing -dizziness or fainting  spells -drooling, shaking, movement difficulty (shuffling walk) or rigidity -fever, chills, sore throat -involuntary or uncontrollable movements of the eyes, mouth, head, arms, and legs -seizures -stomach area pain -unusually weak or tired -unusual bleeding or bruising -yellowing of skin or eyes Side effects that usually do not require medical attention (report to your doctor or health care professional if they continue or are bothersome): -difficulty passing urine -difficulty sleeping -headache -sexual dysfunction -skin rash, or itching This list may not describe all possible side effects. Call your doctor for medical advice about side effects. You may report side effects to FDA at 1-800-FDA-1088. Where should I keep my medicine? Keep out of the reach of children. Store at room temperature between 15 and 30 degrees C (59 and 86 degrees F). Protect from light. Throw away any unused medicine after the expiration date. NOTE: This sheet is a summary. It may not cover all possible information. If you have questions about this medicine, talk to your doctor, pharmacist, or health care provider.  2018 Elsevier/Gold Standard (2011-07-28 16:59:39)  Tizanidine tablets or capsules What is this medicine? TIZANIDINE (tye ZAN i deen) helps to relieve muscle spasms. It may be used to help in the  treatment of multiple sclerosis and spinal cord injury. This medicine may be used for other purposes; ask your health care provider or pharmacist if you have questions. COMMON BRAND NAME(S): Zanaflex What should I tell my health care provider before I take this medicine? They need to know if you have any of these conditions: -kidney disease -liver disease -low blood pressure -mental disorder -an unusual or allergic reaction to tizanidine, other medicines, lactose (tablets only), foods, dyes, or preservatives -pregnant or trying to get pregnant -breast-feeding How should I use this medicine? Take this  medicine by mouth with a full glass of water. Take this medicine on an empty stomach, at least 30 minutes before or 2 hours after food. Do not take with food unless you talk with your doctor. Follow the directions on the prescription label. Take your medicine at regular intervals. Do not take your medicine more often than directed. Do not stop taking except on your doctor's advice. Suddenly stopping the medicine can be very dangerous. Talk to your pediatrician regarding the use of this medicine in children. Patients over 48 years old may have a stronger reaction and need a smaller dose. Overdosage: If you think you have taken too much of this medicine contact a poison control center or emergency room at once. NOTE: This medicine is only for you. Do not share this medicine with others. What if I miss a dose? If you miss a dose, take it as soon as you can. If it is almost time for your next dose, take only that dose. Do not take double or extra doses. What may interact with this medicine? Do not take this medicine with any of the following medications: -ciprofloxacin -cisapride -dofetilide -dronedarone -fluvoxamine -narcotic medicines for cough -pimozide -thiabendazole -thioridazine -ziprasidone This medicine may also interact with the following medications: -acyclovir -alcohol -antihistamines for allergy, cough and cold -baclofen -certain antibiotics like levofloxacin, ofloxacin -certain medicines for anxiety or sleep -certain medicines for blood pressure, heart disease, irregular heart beat -certain medicines for depression like amitriptyline, fluoxetine, sertraline -certain medicines for seizures like phenobarbital, primidone -certain medicines for stomach problems like cimetidine, famotidine -female hormones, like estrogens or progestins and birth control pills, patches, rings, or injections -general anesthetics like halothane, isoflurane, methoxyflurane, propofol -local anesthetics  like lidocaine, pramoxine, tetracaine -medicines that relax muscles for surgery -narcotic medicines for pain -other medicines that prolong the QT interval (cause an abnormal heart rhythm) -phenothiazines like chlorpromazine, mesoridazine, prochlorperazine -ticlopidine -zileuton This list may not describe all possible interactions. Give your health care provider a list of all the medicines, herbs, non-prescription drugs, or dietary supplements you use. Also tell them if you smoke, drink alcohol, or use illegal drugs. Some items may interact with your medicine. What should I watch for while using this medicine? Tell your doctor or health care professional if your symptoms do not start to get better or if they get worse. You may get drowsy or dizzy. Do not drive, use machinery, or do anything that needs mental alertness until you know how this medicine affects you. Do not stand or sit up quickly, especially if you are an older patient. This reduces the risk of dizzy or fainting spells. Alcohol may interfere with the effect of this medicine. Avoid alcoholic drinks. If you are taking another medicine that also causes drowsiness, you may have more side effects. Give your health care provider a list of all medicines you use. Your doctor will tell you how much medicine to take. Do not take  more medicine than directed. Call emergency for help if you have problems breathing or unusual sleepiness. Your mouth may get dry. Chewing sugarless gum or sucking hard candy, and drinking plenty of water may help. Contact your doctor if the problem does not go away or is severe. What side effects may I notice from receiving this medicine? Side effects that you should report to your doctor or health care professional as soon as possible: -allergic reactions like skin rash, itching or hives, swelling of the face, lips, or tongue -breathing problems -hallucinations -signs and symptoms of liver injury like dark yellow or  brown urine; general ill feeling or flu-like symptoms; light-colored stools; loss of appetite; nausea; right upper quadrant belly pain; unusually weak or tired; yellowing of the eyes or skin -signs and symptoms of low blood pressure like dizziness; feeling faint or lightheaded, falls; unusually weak or tired -unusually slow heartbeat -unusually weak or tired Side effects that usually do not require medical attention (report to your doctor or health care professional if they continue or are bothersome): -blurred vision -constipation -dizziness -dry mouth -tiredness This list may not describe all possible side effects. Call your doctor for medical advice about side effects. You may report side effects to FDA at 1-800-FDA-1088. Where should I keep my medicine? Keep out of the reach of children. Store at room temperature between 15 and 30 degrees C (59 and 86 degrees F). Throw away any unused medicine after the expiration date. NOTE: This sheet is a summary. It may not cover all possible information. If you have questions about this medicine, talk to your doctor, pharmacist, or health care provider.  2018 Elsevier/Gold Standard (2014-12-18 13:52:12)

## 2016-12-03 NOTE — Progress Notes (Addendum)
[facility]    Provider:  Dr Jaynee Eagles Referring Provider: [name], MD Primary Care Physician:  [name], MD  CC:  Headaches, fatigue  HPI:  Krystal Edwards is a 31 y.o. female here as a referral from Dr. Gaetano Net for chronic intractable headaches, daytime fatigue. Patient has seen multiple neurologists. Nothing is working. She started having headaches when pregnant and since then takes analgesics daily multiple times a day and has severe daily headache. No vision changes, no hearing changes, no symptoms of IIH. She is here with her mother. I had a long extended visit with patient discussing medication overuse headaches, likely sleep apnea and morbid obesity contributing to her symptoms. ESS 21. Cannot rule out IIH. Needs to stop taking daily analgesics and NSAIDs, needs a sleep evaluation for OSA which is likely (morning headaches, snoring, morbid obesity, severe daytime fatigue). Also discussed after workup may need an LP. Also needs MRI brain, she reports she will need to be sedated and declines at this time. Started when she got pregnant, Jan 2017 and she has daily headaches since then. She takes tylenol, aspirin, caffeine. She takes it multiple times a day or tylenol for at least a year.  No other focal neurologic deficits, associated symptoms, inciting events or modifiable factors.  History:  Past NSAIDS:  etodolac 400mg  twice daily, naproxen 500mg  Past analgesics:  Fioricet, Goody Past abortive triptans:  naratriptan for 3 days Past muscle relaxants:  no Past anti-emetic:  promethazine, Reglan Past antihypertensive medications:  labetolol, nifedipine Past antidepressant medications:  nortriptyline 10mg , imipramine 25mg  Past anticonvulsant medications:  oxcarbazepine 150mg  twice daily, Topamax Past vitamins/Herbal/Supplements:  no Other past therapies:  no  Current NSAIDS:  yes daily Current analgesics:  Tylenol daily Current triptans:  no Current  anti-emetic:  no Current muscle relaxants:  no Current anti-anxiolytic:  no Current sleep aide:  no Current Antihypertensive medications:  no Current Antidepressant medications:  no Current Anticonvulsant medications:  no Current Vitamins/Herbal/Supplements:  no Current Antihistamines/Decongestants:  no Other therapy:  no  Caffeine:  Cola daily Alcohol:  no Smoker:  no Diet:  Needs to increase water intake.  Drinks cola. Exercise:  no Depression/anxiety:  no Sleep hygiene:  Poor due to caring for baby and headache   Magnesium citrate 400mg  to 600mg  daily, riboflavin 400mg , Coenzyme Q 10 100mg  three times daily  Reviewed notes, labs and imaging from outside physicians, which showed:  MRI brain 12/2015 report only was unremarkable  Review of Systems: Patient complains of symptoms per HPI as well as the following symptoms: fatigue, headaches, depression. Pertinent negatives and positives per HPI. All others negative.   Social History   Social History  . Marital status: Single    Spouse name: n/a  . Number of children: 0  . 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 Smoking status: Current Every Day Smoker    Packs/day: 0.25    Years: 9.00    Types: Cigarettes    Last attempt to quit: 01/20/2014  . Smokeless tobacco: Never Used  . Alcohol use No  . Drug use: No  . Sexual activity: Yes    Partners: Male    Birth control/ protection: None   Other Topics Concern  . Not on file   Social History Narrative   Lives with father and brother. Mother lives with her sister, also in [place].    Family History  Problem Relation Age  of Onset  . Breast cancer Mother 53       2005 AND 2013  . Diabetes Father   . Diabetes Maternal Grandfather   . Breast cancer Paternal Grandmother   . Breast cancer Paternal Grandfather     Past Medical History:  Diagnosis Date  . Carpal tunnel syndrome 03-2011    BILATERAL RECEIVES INJECTIONS  . Headache   . Oligo-ovulation     Past Surgical History:  Procedure Laterality Date  . BREAST SURGERY  2009   left breast bx-benign    Current Outpatient Prescriptions  Medication Sig Dispense Refill  . acetaminophen (TYLENOL) 500 MG tablet Take 1,500 mg by mouth every 4 (four) hours as needed for mild pain or moderate pain.    . benzonatate (TESSALON) 100 MG capsule Take 1 capsule (100 mg total) by mouth every 8 (eight) hours. 21 capsule 0  . ipratropium (ATROVENT) 0.06 % nasal spray Place 2 sprays into both nostrils 4 (four) times daily. 15 mL 0  . prochlorperazine (COMPAZINE) 5 MG tablet Take 1 tablet (5 mg total) by mouth every 6 (six) hours as needed for nausea or vomiting. Or headaches. Watch for sedation. 60 tablet 1  . tiZANidine (ZANAFLEX) 4 MG tablet Take 1 tablet (4 mg total) by mouth every 6 (six) hours as needed for muscle spasms. 60 tablet 6  . topiramate (TOPAMAX) 50 MG tablet Take 1 tablet (50 mg total) by mouth at bedtime. (Patient not taking: Reported on 12/03/2016) 30 tablet 2   No current facility-administered medications for this visit.     Allergies as of 12/03/2016  . (No Known Allergies)    Vitals: BP (!) 153/90   Pulse 84   Ht 5\' 7"  (1.702 m)   Wt 283 lb 9.6 oz (128.6 kg)   LMP 11/20/2016   BMI 44.42 kg/m  Last Weight:  Wt Readings from Last 1 Encounters:  12/03/16 283 lb 9.6 oz (128.6 kg)   Last Height:   Ht Readings from Last 1 Encounters:  12/03/16 5\' 7"  (1.702 m)   Physical exam: Exam: Gen: NAD, conversant, well nourised, obese, well groomed                     CV: RRR, no MRG. No Carotid Bruits. No peripheral edema, warm, nontender Eyes: Conjunctivae clear without exudates or hemorrhage  Neuro: Detailed Neurologic Exam  Speech:    Speech is normal; fluent and spontaneous with normal comprehension.  Cognition:    The patient is oriented to person, place, and time;     recent and remote memory intact;       language fluent;     normal attention, concentration,     fund of knowledge Cranial Nerves:    The pupils are equal, round, and reactive to light. The fundi are normal and spontaneous venous pulsations are present. Visual fields are full to finger confrontation. Extraocular movements are intact. Trigeminal sensation is intact and the muscles of mastication are normal. The face is symmetric. The palate elevates in the midline. Hearing intact. Voice is normal. Shoulder shrug is normal. The tongue has normal motion without fasciculations.   Coordination:    Normal finger to nose and heel to shin. Normal rapid alternating movements.   Gait:    Heel-toe and tandem gait are normal.   Motor Observation:    No asymmetry, no atrophy, and no involuntary movements noted. Tone:    Normal muscle tone.    Posture:  Posture is normal. normal erect    Strength:    Strength is V/V in the upper and lower limbs.      Sensation: intact to LT     Reflex Exam:  DTR's:    Deep tendon reflexes in the upper and lower extremities are normal bilaterally.   Toes:    The toes are downgoing bilaterally.   Clonus:    Clonus is absent.       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 Started when she got pregnant, Jan 2017 and she has daily headaches since then. She takes tylenol, aspirin, caffeine multiple times daily.   I had a long discussion with patient that her daily use of ibuprofen or Tylenol can cause medication overuse/rebound headache which is likely causing her chronic daily headaches. They only thing to do is to stop the medication unfortunately. In the timeframe after stopping at her headaches may get much worse. I will give her some medication to try to bridge that. She should significantly  improve with her chronic daily headaches after 2-4 weeks of being off her daily over-the-counter medications. Do not use these medications more than 2 times in a  week.  Migraines: Discussed migraine management including acute management and preventative management.   Excessive daytime fatigue (ESS 20), morbid obesity, snoring, morning headaches: needs sleep eval  Remember to drink plenty of fluid, eat healthy meals and do not skip any meals. Try to eat protein with a every meal and eat a healthy snack such as fruit or nuts in between meals. Try to keep a regular sleep-wake schedule and try to exercise daily, particularly in the form of walking, 20-30 minutes a day, if you can.   As far as your medications are concerned, I would like to suggest:  - Stop daily over the counter med use (Tylenol, excedrin, ibuprofen, alleve, caffeine, etc) Do not tak emore than 2-3x in one week. Discussed medication overuse headache. -  take compazine and tizanidine as needed. Watch for sedation, do not drive unless you know how the medications affects you. As far as diagnostic testing: If headaches persist need MRI brain, she declines at this time, I do recommend  May need an LP to eval for IIH Recommend ophthalmology follow up as well  Discussed; To prevent or relieve headaches, try the following:  Cool Compress. Lie down and place a cool compress on your head.   Avoid headache triggers. If certain foods or odors seem to have triggered your migraines in the past, avoid them. A headache diary might help you identify triggers.   Include physical activity in your daily routine. Try a daily walk or other moderate aerobic exercise.   Manage stress. Find healthy ways to cope with the stressors, such as delegating tasks on your to-do list.   Practice relaxation techniques. Try deep breathing, yoga, massage and visualization.   Eat regularly. Eating regularly scheduled meals and maintaining a healthy diet might help prevent headaches. Also, drink plenty of fluids.   Follow a regular sleep schedule. Sleep deprivation might contribute to headaches  Consider  biofeedback. With this mind-body technique, you learn to control certain bodily functions - such as muscle tension, heart rate and blood pressure - to prevent headaches or reduce headache pain.    Proceed to emergency room if you experience new or worsening symptoms or symptoms do not resolve, if you have new neurologic symptoms or if headache is severe, or for any concerning symptom.  Sarina Ill, MD  [facility] [address] [place], [place] [address]  Phone [phone] Fax [phone]  A total of 60 minutes was spent face-to-face with this patient. Over half this time was spent on counseling patient on the medication overuse,  diagnosis and different diagnostic and therapeutic options available.

## 2016-12-04 LAB — COMPREHENSIVE METABOLIC PANEL
A/G RATIO: 1.3 (ref 1.2–2.2)
ALBUMIN: 4.3 g/dL (ref 3.5–5.5)
ALK PHOS: 54 IU/L (ref 39–117)
ALT: 40 IU/L — ABNORMAL HIGH (ref 0–32)
AST: 28 IU/L (ref 0–40)
BILIRUBIN TOTAL: 0.2 mg/dL (ref 0.0–1.2)
BUN / CREAT RATIO: 9 (ref 9–23)
BUN: 8 mg/dL (ref 6–20)
CHLORIDE: 103 mmol/L (ref 96–106)
CO2: 20 mmol/L (ref 20–29)
Calcium: 9.6 mg/dL (ref 8.7–10.2)
Creatinine, Ser: 0.86 mg/dL (ref 0.57–1.00)
GFR calc Af Amer: 103 mL/min/{1.73_m2} (ref >59)
GFR calc non Af Amer: 89 mL/min/{1.73_m2} (ref >59)
GLOBULIN, TOTAL: 3.3 g/dL (ref 1.5–4.5)
GLUCOSE: 174 mg/dL — AB (ref 65–99)
POTASSIUM: 4.5 mmol/L (ref 3.5–5.2)
Sodium: 139 mmol/L (ref 134–144)
Total Protein: 7.6 g/dL (ref 6.0–8.5)

## 2016-12-04 LAB — CBC
Hematocrit: 40.1 % (ref 34.0–46.6)
Hemoglobin: 13 g/dL (ref 11.1–15.9)
MCH: 28 pg (ref 26.6–33.0)
MCHC: 32.4 g/dL (ref 31.5–35.7)
MCV: 86 fL (ref 79–97)
PLATELETS: 436 10*3/uL — AB (ref 150–379)
RBC: 4.65 x10E6/uL (ref 3.77–5.28)
RDW: 13.5 % (ref 12.3–15.4)
WBC: 12.8 10*3/uL — AB (ref 3.4–10.8)

## 2016-12-07 ENCOUNTER — Telehealth: Payer: Self-pay | Admitting: *Deleted

## 2016-12-07 NOTE — Telephone Encounter (Signed)
-----   Message from Melvenia Beam, MD sent at 12/07/2016  4:57 PM EDT ----- Her glucose is elevated at 174,  she should be evaluated for diabetes, if she has untreated diabetes this could be making her very tired and fatigued. Herlkiver function is slightly elevated at 40. Her WBCs are elevated which is chronic.

## 2016-12-07 NOTE — Telephone Encounter (Signed)
Called and spoke with patient about lab results per AA,MD note. She verbalized understanding and will f/u with PCP as recommended.   Faxed results to PCP at [phone]. Received fax confirmation.

## 2016-12-09 ENCOUNTER — Telehealth: Payer: Self-pay | Admitting: Internal Medicine

## 2016-12-09 NOTE — Telephone Encounter (Signed)
Patient is requesting to become a patient of yours. Please advise. Thank you.

## 2016-12-10 NOTE — Telephone Encounter (Signed)
Fine

## 2016-12-10 NOTE — Telephone Encounter (Signed)
Patient's mother was informed. She is going to check her schedule and call the office back to set up a new patient appointment.

## 2016-12-23 NOTE — Progress Notes (Signed)
Received this notice from our sleep lab: "Pt has been called and lvm on the following days 9/19, 9/27, and 10/3. Pt will now be listed as unable to contact. If pt calls back I will be more than happy to get them schedule for sleep consult. Thank you."

## 2016-12-26 NOTE — Progress Notes (Signed)
Subjective:    Patient ID: Krystal Edwards, female    DOB: [birth year], 31 y.o.   MRN: [id number]  HPI She is here to establish with a new pcp.    elevated blood pressure:  She had elevated blood pressure at the end of her pregnancy one year ago.  She has not been on medication since then because her BP has been ok. She has checked it at [facility] and it has been elevated.   She is smoking.  She wants to quit and wondered about chantix.   She has a family history of diabetes.  She did not have gestational diabetes.  She does not eat healthy.  She does not exercise.    She is tired all the time.  She has never felt that way before.  She wakes up throughout the night - she is concerned about things.  She gets tired walking,  Today she got tired an SOB walking from the lobby to the exam room.  She feels SOB at times.   She has PCOS and last saw gyn one year ago.  She was on birth control in the past, but has never been on metformin.    Medications and allergies reviewed with patient and updated if appropriate.  Patient Active Problem List   Diagnosis Date Noted  . Chronic daily headache 02/12/2016  . Pre-eclampsia 12/02/2015  . Headache in pregnancy 08/15/2015  . Neck pain 08/15/2015  . Occipital neuralgia of right side 08/15/2015  . Echogenic intracardiac focus of fetus on prenatal ultrasound 08/06/2015  . Choroid plexus cyst of fetus 08/06/2015  . Laryngopharyngeal reflux (LPR) 02/26/2014  . Cough 02/26/2014  . Hoarseness or changing voice 02/26/2014    Current Outpatient Prescriptions on File Prior to Visit  Medication Sig Dispense Refill  . acetaminophen (TYLENOL) 500 MG tablet Take 1,500 mg by mouth every 4 (four) hours as needed for mild pain or moderate pain.     No current facility-administered medications on file prior to visit.     Past Medical History:  Diagnosis Date  . Carpal tunnel syndrome 03-2011   BILATERAL RECEIVES INJECTIONS  . Headache   .  Oligo-ovulation     Past Surgical History:  Procedure Laterality Date  . BREAST SURGERY  2009   left breast bx-benign    Social History   Social History  . Marital status: Single    Spouse name: n/a  . Number of children: 0  . 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 Smoking status: Current Every Day Smoker    Packs/day: 0.25    Years: 9.00    Types: Cigarettes    Last attempt to quit: 01/20/2014  . Smokeless tobacco: Never Used  . Alcohol use No  . Drug use: No  . Sexual activity: Yes    Partners: Male    Birth control/ protection: None   Other Topics Concern  . None   Social History Narrative   Lives with father and brother. Mother lives with her sister, also in [place].    Family History  Problem Relation Age of Onset  . Breast cancer Mother 15       2005 AND 2013  . Diabetes Father   . Diabetes Maternal Grandfather   . Breast cancer Paternal Grandmother   . Breast cancer Paternal Grandfather     Review of Systems  Constitutional: Positive for diaphoresis. Negative  for chills and fever.  Respiratory: Positive for cough (had cold one month ago, brings up mucus) and shortness of breath. Negative for wheezing.   Cardiovascular: Positive for palpitations (only when she is out of breath) and leg swelling (occ feet). Negative for chest pain.  Gastrointestinal: Positive for abdominal pain (umbilical pain - burning type pain - intermittent - usually after she eats) and nausea (sometimes). Negative for blood in stool, constipation and diarrhea.       GERD every night/day  Endocrine: Positive for heat intolerance and polydipsia. Negative for polyuria.  Genitourinary: Negative for dysuria and hematuria.  Musculoskeletal: Negative for arthralgias.  Neurological: Positive for light-headedness (if in heat or going too much) and headaches (migraines).  Psychiatric/Behavioral: Negative for dysphoric  mood. The patient is not nervous/anxious.        Objective:   Vitals:   12/28/16 1504  BP: (!) 150/94  Pulse: 78  Resp: 16  Temp: 98.4 F (36.9 C)  SpO2: 98%   Filed Weights   12/28/16 1504  Weight: 286 lb (129.7 kg)   Body mass index is 44.79 kg/m.  Wt Readings from Last 3 Encounters:  12/28/16 286 lb (129.7 kg)  12/03/16 283 lb 9.6 oz (128.6 kg)  11/25/16 284 lb 9.8 oz (129.1 kg)     Physical Exam Constitutional: Appears well-developed and well-nourished. No distress.  HENT:  Head: Normocephalic and atraumatic.  Neck: Neck supple. No tracheal deviation present. No thyromegaly present.  No cervical lymphadenopathy Cardiovascular: Normal rate, regular rhythm and normal heart sounds.   No murmur heard. No carotid bruit .  No edema Pulmonary/Chest: Effort normal and breath sounds normal. No respiratory distress. No has no wheezes. No rales.  Abdomen: obese, soft, non tender Skin: Skin is warm and dry. Not diaphoretic.  Psychiatric: Normal mood and affect. Behavior is normal.         Assessment & Plan:   See Problem List for Assessment and Plan of chronic medical problems.

## 2016-12-28 ENCOUNTER — Encounter: Payer: Self-pay | Admitting: Internal Medicine

## 2016-12-28 ENCOUNTER — Other Ambulatory Visit (INDEPENDENT_AMBULATORY_CARE_PROVIDER_SITE_OTHER): Payer: 59

## 2016-12-28 ENCOUNTER — Ambulatory Visit (INDEPENDENT_AMBULATORY_CARE_PROVIDER_SITE_OTHER): Payer: 59 | Admitting: Internal Medicine

## 2016-12-28 VITALS — BP 150/94 | HR 78 | Temp 98.4°F | Resp 16 | Ht 67.0 in | Wt 286.0 lb

## 2016-12-28 DIAGNOSIS — E282 Polycystic ovarian syndrome: Secondary | ICD-10-CM

## 2016-12-28 DIAGNOSIS — Z833 Family history of diabetes mellitus: Secondary | ICD-10-CM | POA: Diagnosis not present

## 2016-12-28 DIAGNOSIS — I10 Essential (primary) hypertension: Secondary | ICD-10-CM | POA: Diagnosis not present

## 2016-12-28 DIAGNOSIS — K2100 Gastro-esophageal reflux disease with esophagitis, without bleeding: Secondary | ICD-10-CM

## 2016-12-28 DIAGNOSIS — F17200 Nicotine dependence, unspecified, uncomplicated: Secondary | ICD-10-CM

## 2016-12-28 DIAGNOSIS — R0609 Other forms of dyspnea: Secondary | ICD-10-CM | POA: Diagnosis not present

## 2016-12-28 DIAGNOSIS — E119 Type 2 diabetes mellitus without complications: Secondary | ICD-10-CM | POA: Insufficient documentation

## 2016-12-28 DIAGNOSIS — K219 Gastro-esophageal reflux disease without esophagitis: Secondary | ICD-10-CM | POA: Insufficient documentation

## 2016-12-28 LAB — CBC WITH DIFFERENTIAL/PLATELET
BASOS ABS: 0.1 10*3/uL (ref 0.0–0.1)
Basophils Relative: 0.7 % (ref 0.0–3.0)
EOS ABS: 0.5 10*3/uL (ref 0.0–0.7)
Eosinophils Relative: 4.5 % (ref 0.0–5.0)
HCT: 39.8 % (ref 36.0–46.0)
Hemoglobin: 13.1 g/dL (ref 12.0–15.0)
LYMPHS ABS: 3.2 10*3/uL (ref 0.7–4.0)
Lymphocytes Relative: 30.2 % (ref 12.0–46.0)
MCHC: 33 g/dL (ref 30.0–36.0)
MCV: 85.6 fl (ref 78.0–100.0)
MONOS PCT: 4.6 % (ref 3.0–12.0)
Monocytes Absolute: 0.5 10*3/uL (ref 0.1–1.0)
NEUTROS ABS: 6.4 10*3/uL (ref 1.4–7.7)
NEUTROS PCT: 60 % (ref 43.0–77.0)
PLATELETS: 367 10*3/uL (ref 150.0–400.0)
RBC: 4.65 Mil/uL (ref 3.87–5.11)
RDW: 13.3 % (ref 11.5–15.5)
WBC: 10.6 10*3/uL — ABNORMAL HIGH (ref 4.0–10.5)

## 2016-12-28 LAB — TSH: TSH: 0.7 u[IU]/mL (ref 0.35–4.50)

## 2016-12-28 LAB — COMPREHENSIVE METABOLIC PANEL
ALT: 40 U/L — AB (ref 0–35)
AST: 30 U/L (ref 0–37)
Albumin: 4.2 g/dL (ref 3.5–5.2)
Alkaline Phosphatase: 46 U/L (ref 39–117)
BILIRUBIN TOTAL: 0.4 mg/dL (ref 0.2–1.2)
BUN: 12 mg/dL (ref 6–23)
CHLORIDE: 104 meq/L (ref 96–112)
CO2: 27 meq/L (ref 19–32)
Calcium: 9.7 mg/dL (ref 8.4–10.5)
Creatinine, Ser: 0.87 mg/dL (ref 0.40–1.20)
GFR: 96.03 mL/min (ref >60.00)
GLUCOSE: 103 mg/dL — AB (ref 70–99)
Potassium: 3.8 mEq/L (ref 3.5–5.1)
Sodium: 138 mEq/L (ref 135–145)
Total Protein: 7.7 g/dL (ref 6.0–8.3)

## 2016-12-28 LAB — HEMOGLOBIN A1C: Hgb A1c MFr Bld: 6.5 % (ref 4.6–6.5)

## 2016-12-28 MED ORDER — OMEPRAZOLE 40 MG PO CPDR: 40.0000 mg | DELAYED_RELEASE_CAPSULE | Freq: Every day | ORAL | 3 refills | Status: DC

## 2016-12-28 MED ORDER — VARENICLINE TARTRATE 0.5 MG X 11 & 1 MG X 42 PO MISC: ORAL | 0 refills | Status: DC

## 2016-12-28 MED ORDER — IRBESARTAN 75 MG PO TABS
75.0000 mg | ORAL_TABLET | Freq: Every day | ORAL | 2 refills | Status: DC
Start: 2016-12-28 — End: 2017-09-28

## 2016-12-28 NOTE — Assessment & Plan Note (Addendum)
Likely from smoking, obesity, deconditioning Stressed smoking cessation  Increase activity and work on weight loss EKG today was NSR at 81 bpm, no change from prior If no improvement will require further evaluation

## 2016-12-28 NOTE — Assessment & Plan Note (Signed)
New H/o pre-eclampsia and has had several elevated readings Start avapro 75 mg daily Check tsh, cbc, cmp Work on weight loss Low sodium diet Regular exercise F/u in 1 month

## 2016-12-28 NOTE — Assessment & Plan Note (Signed)
Will f/u with gyn May benefit from birth control or metformin Will check a1c and discuss further at next visit.

## 2016-12-28 NOTE — Patient Instructions (Addendum)
Test(s) ordered today. Your results will be released to [facility] (or called to you) after review, usually within 72hours after test completion. If any changes need to be made, you will be notified at that same time.  All other Health Maintenance issues reviewed.   All recommended immunizations and age-appropriate screenings are up-to-date or discussed.  No immunizations administered today.   Medications reviewed and updated.  Changes include:  1. Omeprazole 40 mg daily for your heartburn 2. avapro 75 mg daily for your blood pressure 3. chantix for the smoking  Your prescription(s) have been submitted to your pharmacy. Please take as directed and contact our office if you believe you are having problem(s) with the medication(s).   Please followup in 1 month   Heartburn Heartburn is a type of pain or discomfort that can happen in the throat or chest. It is often described as a burning pain. It may also cause a bad taste in the mouth. Heartburn may feel worse when you lie down or bend over, and it is often worse at night. Heartburn may be caused by stomach contents that move back up into the esophagus (reflux). Follow these instructions at home: Take these actions to decrease your discomfort and to help avoid complications. Diet  Follow a diet as recommended by your health care provider. This may involve avoiding foods and drinks such as: ? Coffee and tea (with or without caffeine). ? Drinks that contain alcohol. ? Energy drinks and sports drinks. ? Carbonated drinks or sodas. ? Chocolate and cocoa. ? Peppermint and mint flavorings. ? Garlic and onions. ? Horseradish. ? Spicy and acidic foods, including peppers, chili powder, curry powder, vinegar, hot sauces, and barbecue sauce. ? Citrus fruit juices and citrus fruits, such as oranges, lemons, and limes. ? Tomato-based foods, such as red sauce, chili, salsa, and pizza with red sauce. ? Fried and fatty foods, such as donuts, french  fries, potato chips, and high-fat dressings. ? High-fat meats, such as hot dogs and fatty cuts of red and white meats, such as rib eye steak, sausage, ham, and bacon. ? High-fat dairy items, such as whole milk, butter, and cream cheese.  Eat small, frequent meals instead of large meals.  Avoid drinking large amounts of liquid with your meals.  Avoid eating meals during the 2-3 hours before bedtime.  Avoid lying down right after you eat.  Do not exercise right after you eat. General instructions  Pay attention to any changes in your symptoms.  Take over-the-counter and prescription medicines only as told by your health care provider. Do not take aspirin, ibuprofen, or other NSAIDs unless your health care provider told you to do so.  Do not use any tobacco products, including cigarettes, chewing tobacco, and e-cigarettes. If you need help quitting, ask your health care provider.  Wear loose-fitting clothing. Do not wear anything tight around your waist that causes pressure on your abdomen.  Raise (elevate) the head of your bed about 6 inches (15 cm).  Try to reduce your stress, such as with yoga or meditation. If you need help reducing stress, ask your health care provider.  If you are overweight, reduce your weight to an amount that is healthy for you. Ask your health care provider for guidance about a safe weight loss goal.  Keep all follow-up visits as told by your health care provider. This is important. Contact a health care provider if:  You have new symptoms.  You have unexplained weight loss.  You have difficulty  swallowing, or it hurts to swallow.  You have wheezing or a persistent cough.  Your symptoms do not improve with treatment.  You have frequent heartburn for more than two weeks. Get help right away if:  You have pain in your arms, neck, jaw, teeth, or back.  You feel sweaty, dizzy, or light-headed.  You have chest pain or shortness of breath.  You  vomit and your vomit looks like blood or coffee grounds.  Your stool is bloody or black. This information is not intended to replace advice given to you by your health care provider. Make sure you discuss any questions you have with your health care provider. Document Released: 07/26/2008 Document Revised: 08/15/2015 Document Reviewed: 07/04/2014 Elsevier Interactive Patient Education  2017 [employer].

## 2016-12-28 NOTE — Assessment & Plan Note (Signed)
Check a1c Low sugar / carb diet Stressed regular exercise, weight loss  

## 2016-12-28 NOTE — Assessment & Plan Note (Signed)
Wants to quit and interested in chantix Discussed possible side effects Discussed replacing smoking with exercise, other positive habits Will prescribe starter month Follow up in one month

## 2016-12-28 NOTE — Assessment & Plan Note (Signed)
Having daily/nightly symptoms Discussed GERD diet Advised weight loss Information given Start omeprazole 40 mg daily F/u in one month

## 2017-01-22 MED FILL — IRBESARTAN 75 MG TABLET: 75 | 30 days supply | Qty: 30 | Fill #0

## 2017-01-22 MED FILL — OMEPRAZOLE DR 40 MG CAPSULE: 40 | 30 days supply | Qty: 30 | Fill #0

## 2017-01-28 NOTE — Patient Instructions (Addendum)
Start taking 1000 units of vitamin d and 1000 mcg of vitamin B12 daily.   Test(s) ordered today. Your results will be released to [facility] (or called to you) after review, usually within 72hours after test completion. If any changes need to be made, you will be notified at that same time.  All other Health Maintenance issues reviewed.   All recommended immunizations and age-appropriate screenings are up-to-date or discussed.  No immunizations administered today.   Medications reviewed and updated.  Changes include starting metformin 500 mg twice daily with your meals.   Your prescription(s) have been submitted to your pharmacy. Please take as directed and contact our office if you believe you are having problem(s) with the medication(s).   Please followup in 3 months

## 2017-01-28 NOTE — Progress Notes (Signed)
Subjective:    Patient ID: Krystal Edwards, female    DOB: 06-Jul-1985, 31 y.o.   MRN: [id number]  HPI The patient is here for follow up of her hypertension.   Hypertension:  We started her on avapro 75 mg daily one month ago.  She is taking her medication daily. She is compliant with a low sodium diet.  She denies chest pain, edema, and lightheadedness. She is exercising irregularly.  She does not monitor her blood pressure at home regularly but has checked it twice and it was elevated.    GERD:  She was having daily gerd.  We started omeprazole 40 mg daily one month ago.  Her GERD is better.   She only has GERD when she eats something spicy, which she knows she should not eat.    Diabetes: This is a new diagnosis.  She has not been started on any medication. She is more compliant with a diabetic diet. She is exercising irregularly.  She knows what she needs to do.  Her mom has diabetes.   Tobacco abuse:  I prescribed chantix one month ago, but she could not afford it.  She has been cutting down on how much she smokes and it trying to quit.    Bronchitis:  She was prescribed a cough syrup - promethazine and it helped.  Her cough is getting better.  She denies wheeze and fever.  She has chronic SOB and that is not new.     She is tired all the time.  She gets 4 hrs at night.  She is trying to increase her sleep.    Medications and allergies reviewed with patient and updated if appropriate.  Patient Active Problem List   Diagnosis Date Noted  . GERD (gastroesophageal reflux disease) 12/28/2016  . PCOS (polycystic ovarian syndrome) 12/28/2016  . Family history of diabetes mellitus 12/28/2016  . Tobacco dependence 12/28/2016  . Essential hypertension 12/28/2016  . DOE (dyspnea on exertion) 12/28/2016  . Diabetes mellitus without complication ([place]) 95/18/8416  . Chronic daily headache 02/12/2016  . Pre-eclampsia 12/02/2015  . Neck pain 08/15/2015  . Occipital neuralgia of right side  08/15/2015  . Choroid plexus cyst of fetus 08/06/2015  . Laryngopharyngeal reflux (LPR) 02/26/2014  . Cough 02/26/2014    Current Outpatient Medications on File Prior to Visit  Medication Sig Dispense Refill  . acetaminophen (TYLENOL) 500 MG tablet Take 1,500 mg by mouth every 4 (four) hours as needed for mild pain or moderate pain.    Marland Kitchen irbesartan (AVAPRO) 75 MG tablet Take 1 tablet (75 mg total) by mouth daily. 30 tablet 2  . omeprazole (PRILOSEC) 40 MG capsule Take 1 capsule (40 mg total) by mouth daily. Take 30 minutes prior to a meal 30 capsule 3  . varenicline (CHANTIX STARTING MONTH PAK) 0.5 MG X 11 & 1 MG X 42 tablet Take one 0.5 mg tab by mouth once daily for 3 days, then increase to one 0.5 mg tab BID for 4 days, then increase to one 1 mg tab BID (Patient not taking: Reported on 01/29/2017) 53 tablet 0   No current facility-administered medications on file prior to visit.     Past Medical History:  Diagnosis Date  . Carpal tunnel syndrome 03-2011   BILATERAL RECEIVES INJECTIONS  . Headache   . Oligo-ovulation     Past Surgical History:  Procedure Laterality Date  . BREAST SURGERY  2009   left breast bx-benign    Social  History   Socioeconomic History  . Marital status: Single    Spouse name: n/a  . Number of children: 0  . Years of education: 52  . Highest education level: None  Social Needs  . Financial resource strain: None  . Food insecurity - worry: None  . Food insecurity - inability: None  . Transportation needs - medical: None  . Transportation needs - non-medical: None  Occupational History  . Occupation: Therapist, art rep    Employer: [employer]  Tobacco Use  . Smoking status: Current Every Day Smoker    Packs/day: 0.25    Years: 9.00    Pack years: 2.25    Types: Cigarettes    Last attempt to quit: 01/20/2014    Years since quitting: 3.0  . Smokeless tobacco: Never Used  Substance and Sexual Activity  . Alcohol use: No    Alcohol/week: 0.0 oz    . Drug use: No  . Sexual activity: Yes    Partners: Male    Birth control/protection: None  Other Topics Concern  . None  Social History Narrative   Lives with father and brother. Mother lives with her sister, also in [place].    Family History  Problem Relation Age of Onset  . Breast cancer Mother 14       2005 AND 2013  . Diabetes Father   . Diabetes Maternal Grandfather   . Breast cancer Paternal Grandmother   . Breast cancer Paternal Grandfather     Review of Systems  Constitutional: Negative for chills and fever.  Respiratory: Positive for cough and shortness of breath (with exertion). Negative for wheezing.   Cardiovascular: Positive for palpitations (with exertion). Negative for chest pain and leg swelling.  Musculoskeletal: Positive for myalgias (at night).  Neurological: Positive for headaches. Negative for light-headedness.       Objective:   Vitals:   01/29/17 1552  BP: (!) 144/96  Pulse: 85  Temp: 97.8 F (36.6 C)  SpO2: 100%   Wt Readings from Last 3 Encounters:  01/29/17 288 lb (130.6 kg)  12/28/16 286 lb (129.7 kg)  12/03/16 283 lb 9.6 oz (128.6 kg)   Body mass index is 45.11 kg/m.   Physical Exam    Constitutional: Appears well-developed and well-nourished. No distress.  HENT:  Head: Normocephalic and atraumatic.  Neck: Neck supple. No tracheal deviation present. No thyromegaly present.  No cervical lymphadenopathy Cardiovascular: Normal rate, regular rhythm and normal heart sounds.   No murmur heard. No carotid bruit .  No edema Pulmonary/Chest: Effort normal and breath sounds normal. No respiratory distress. No has no wheezes. No rales.  Skin: Skin is warm and dry. Not diaphoretic.  Psychiatric: Normal mood and affect. Behavior is normal.      Assessment & Plan:    See Problem List for Assessment and Plan of chronic medical problems.

## 2017-01-29 ENCOUNTER — Ambulatory Visit: Payer: 59 | Admitting: Internal Medicine

## 2017-01-29 ENCOUNTER — Telehealth: Payer: Self-pay | Admitting: Internal Medicine

## 2017-01-29 ENCOUNTER — Encounter: Payer: Self-pay | Admitting: Internal Medicine

## 2017-01-29 VITALS — BP 144/96 | HR 85 | Temp 97.8°F | Ht 67.0 in | Wt 288.0 lb

## 2017-01-29 DIAGNOSIS — E119 Type 2 diabetes mellitus without complications: Secondary | ICD-10-CM | POA: Diagnosis not present

## 2017-01-29 DIAGNOSIS — E282 Polycystic ovarian syndrome: Secondary | ICD-10-CM

## 2017-01-29 DIAGNOSIS — I10 Essential (primary) hypertension: Secondary | ICD-10-CM

## 2017-01-29 DIAGNOSIS — K2100 Gastro-esophageal reflux disease with esophagitis, without bleeding: Secondary | ICD-10-CM

## 2017-01-29 DIAGNOSIS — F17200 Nicotine dependence, unspecified, uncomplicated: Secondary | ICD-10-CM | POA: Diagnosis not present

## 2017-01-29 MED ORDER — METFORMIN HCL 500 MG PO TABS: 500.0000 mg | ORAL_TABLET | Freq: Two times a day (BID) | ORAL | 5 refills | Status: DC

## 2017-01-29 NOTE — Assessment & Plan Note (Signed)
Will f/u with gyn Will start metformin Work on weight loss

## 2017-01-29 NOTE — Assessment & Plan Note (Signed)
Still elevated  She would like to hold off on making any changes today Continue avapro 75 mg daily Decreased salt intake Weight loss and regular exercise

## 2017-01-29 NOTE — Assessment & Plan Note (Signed)
Will start metformin BID Stressed weight loss  Deferred nutrition now - she is working on diet changes with her neurologist Stressed regular exercise F/u in 3 months

## 2017-01-29 NOTE — Assessment & Plan Note (Signed)
Much improved Continue omeprazole 40 mg daily for now Work on weight loss and diet

## 2017-01-29 NOTE — Telephone Encounter (Signed)
I have received FMLA via faxed for patient from [employer].

## 2017-01-30 ENCOUNTER — Encounter: Payer: Self-pay | Admitting: Internal Medicine

## 2017-01-30 NOTE — Assessment & Plan Note (Signed)
Unable to afford chantix Working on quitting on her own

## 2017-02-01 ENCOUNTER — Telehealth: Payer: Self-pay | Admitting: *Deleted

## 2017-02-01 NOTE — Telephone Encounter (Signed)
Called and LVM to find out what the FMLA is for.   If patient called back and I am unavailable. I need to know what kind of time she is asking for, ie; intermittent.  I need to know what kind of time off she is requesting, how many days a month?  Has her and Dr.Burns talked about this?

## 2017-02-01 NOTE — Telephone Encounter (Signed)
Pt [employer] form on [place] desk. 

## 2017-02-02 NOTE — Telephone Encounter (Signed)
error 

## 2017-02-02 NOTE — Telephone Encounter (Signed)
Called and LVM again.   Dr. Quay Burow are you aware of patient needing FMLA for any reason? I see forms have been sent to Neurology as well.

## 2017-02-02 NOTE — Telephone Encounter (Signed)
I do not recall discussing FMLA and am not sure why she would need this....Marland KitchenMarland Kitchen

## 2017-02-03 NOTE — Telephone Encounter (Signed)
Called patient again to find out why we have the FMLA. No answer. I will hang on to it but unless patient calls back and informs otherwise we have no reason to fill out this FMLA.

## 2017-02-10 NOTE — Telephone Encounter (Addendum)
Pt paid for form on 02/15/17

## 2017-02-15 DIAGNOSIS — Z0289 Encounter for other administrative examinations: Secondary | ICD-10-CM

## 2017-02-16 NOTE — Telephone Encounter (Signed)
FMLA papers completed, signed, and sent to medical records for processing. Per Hilda Blades, pt has paid for these forms.    There are also work accomodation forms that will need to be paid for prior to completing. Debra aware.

## 2017-04-21 ENCOUNTER — Other Ambulatory Visit: Payer: Self-pay | Admitting: Obstetrics and Gynecology

## 2017-04-21 DIAGNOSIS — N6320 Unspecified lump in the left breast, unspecified quadrant: Secondary | ICD-10-CM

## 2017-04-26 ENCOUNTER — Other Ambulatory Visit: Payer: Self-pay | Admitting: Obstetrics and Gynecology

## 2017-04-26 ENCOUNTER — Ambulatory Visit
Admission: RE | Admit: 2017-04-26 | Discharge: 2017-04-26 | Disposition: A | Discharge status: Home / Self Care | Payer: 59 | Source: Ambulatory Visit | Attending: Obstetrics and Gynecology | Admitting: Obstetrics and Gynecology

## 2017-04-26 DIAGNOSIS — N6320 Unspecified lump in the left breast, unspecified quadrant: Secondary | ICD-10-CM

## 2017-04-26 DIAGNOSIS — N6310 Unspecified lump in the right breast, unspecified quadrant: Secondary | ICD-10-CM

## 2017-04-26 DIAGNOSIS — D352 Benign neoplasm of pituitary gland: Secondary | ICD-10-CM

## 2017-04-27 NOTE — Telephone Encounter (Signed)
If neuro has been doing it - they should do it

## 2017-04-27 NOTE — Telephone Encounter (Signed)
I have received FMLA for this patient again. As before you are not completing FMLA for this patient, right?   Neurology has always completed her FMLA in the pass.

## 2017-05-06 NOTE — Progress Notes (Deleted)
Subjective:    Patient ID: Krystal Edwards, female    DOB: 02/07/1986, 32 y.o.   MRN: [id number]  HPI The patient is here for follow up.  Hypertension: She is taking her medication daily. She is compliant with a low sodium diet.  She denies chest pain, palpitations, edema, shortness of breath and regular headaches. She is exercising regularly.  She does not monitor her blood pressure at home.    Diabetes: she was diagnosed 12/2016.  She is taking her medication daily as prescribed. She is compliant with a diabetic diet. She is exercising regularly. She monitors her sugars and they have been running XXX. She checks her feet daily and denies foot lesions. She is up-to-date with an ophthalmology examination.   GERD:  She is taking her medication daily as prescribed.  She denies any GERD symptoms and feels her GERD is well controlled.     Medications and allergies reviewed with patient and updated if appropriate.  Patient Active Problem List   Diagnosis Date Noted  . GERD (gastroesophageal reflux disease) 12/28/2016  . PCOS (polycystic ovarian syndrome) 12/28/2016  . Family history of diabetes mellitus 12/28/2016  . Tobacco dependence 12/28/2016  . Essential hypertension 12/28/2016  . DOE (dyspnea on exertion) 12/28/2016  . Diabetes mellitus without complication ([place]) 52/77/8242  . Chronic daily headache 02/12/2016  . Neck pain 08/15/2015  . Occipital neuralgia of right side 08/15/2015  . Laryngopharyngeal reflux (LPR) 02/26/2014  . Cough 02/26/2014    Current Outpatient Medications on File Prior to Visit  Medication Sig Dispense Refill  . acetaminophen (TYLENOL) 500 MG tablet Take 1,500 mg by mouth every 4 (four) hours as needed for mild pain or moderate pain.    Marland Kitchen irbesartan (AVAPRO) 75 MG tablet Take 1 tablet (75 mg total) by mouth daily. 30 tablet 2  . metFORMIN (GLUCOPHAGE) 500 MG tablet Take 1 tablet (500 mg total) 2 (two) times daily with a meal by mouth. 60 tablet 5  .  omeprazole (PRILOSEC) 40 MG capsule Take 1 capsule (40 mg total) by mouth daily. Take 30 minutes prior to a meal 30 capsule 3  . varenicline (CHANTIX STARTING MONTH PAK) 0.5 MG X 11 & 1 MG X 42 tablet Take one 0.5 mg tab by mouth once daily for 3 days, then increase to one 0.5 mg tab BID for 4 days, then increase to one 1 mg tab BID (Patient not taking: Reported on 01/29/2017) 53 tablet 0   No current facility-administered medications on file prior to visit.     Past Medical History:  Diagnosis Date  . Carpal tunnel syndrome 03-2011   BILATERAL RECEIVES INJECTIONS  . Choroid plexus cyst of fetus 08/06/2015  . Headache   . Oligo-ovulation   . Pre-eclampsia 12/02/2015    Past Surgical History:  Procedure Laterality Date  . BREAST BIOPSY Left 04/2007   benign  . BREAST SURGERY  2009   left breast bx-benign    Social History   Socioeconomic History  . Marital status: Single    Spouse name: n/a  . Number of children: 0  . Years of education: 63  . Highest education level: Not on file  Social Needs  . Financial resource strain: Not on file  . Food insecurity - worry: Not on file  . Food insecurity - inability: Not on file  . Transportation needs - medical: Not on file  . Transportation needs - non-medical: Not on file  Occupational History  . Occupation: customer  service rep    Employer: [employer]  Tobacco Use  . Smoking status: Current Every Day Smoker    Packs/day: 0.25    Years: 9.00    Pack years: 2.25    Types: Cigarettes    Last attempt to quit: 01/20/2014    Years since quitting: 3.2  . Smokeless tobacco: Never Used  Substance and Sexual Activity  . Alcohol use: No    Alcohol/week: 0.0 oz  . Drug use: No  . Sexual activity: Yes    Partners: Male    Birth control/protection: None  Other Topics Concern  . Not on file  Social History Narrative   Lives with father and brother. Mother lives with her sister, also in [place].    Family History  Problem Relation Age  of Onset  . Breast cancer Mother 34       2005 AND 2013  . Diabetes Father   . Diabetes Maternal Grandfather   . Breast cancer Paternal Grandmother   . Breast cancer Paternal Grandfather     Review of Systems     Objective:  There were no vitals filed for this visit. Wt Readings from Last 3 Encounters:  01/29/17 288 lb (130.6 kg)  12/28/16 286 lb (129.7 kg)  12/03/16 283 lb 9.6 oz (128.6 kg)   There is no height or weight on file to calculate BMI.   Physical Exam    Constitutional: Appears well-developed and well-nourished. No distress.  HENT:  Head: Normocephalic and atraumatic.  Neck: Neck supple. No tracheal deviation present. No thyromegaly present.  No cervical lymphadenopathy Cardiovascular: Normal rate, regular rhythm and normal heart sounds.   No murmur heard. No carotid bruit .  No edema Pulmonary/Chest: Effort normal and breath sounds normal. No respiratory distress. No has no wheezes. No rales.  Skin: Skin is warm and dry. Not diaphoretic.  Psychiatric: Normal mood and affect. Behavior is normal.      Assessment & Plan:    See Problem List for Assessment and Plan of chronic medical problems.

## 2017-05-07 ENCOUNTER — Ambulatory Visit
Admission: RE | Admit: 2017-05-07 | Discharge: 2017-05-07 | Disposition: A | Discharge status: Home / Self Care | Payer: 59 | Source: Ambulatory Visit | Attending: Obstetrics and Gynecology | Admitting: Obstetrics and Gynecology

## 2017-05-07 ENCOUNTER — Other Ambulatory Visit: Payer: Self-pay | Admitting: Obstetrics and Gynecology

## 2017-05-07 ENCOUNTER — Ambulatory Visit: Payer: Self-pay | Admitting: Internal Medicine

## 2017-05-07 DIAGNOSIS — N6310 Unspecified lump in the right breast, unspecified quadrant: Secondary | ICD-10-CM

## 2017-05-07 DIAGNOSIS — Z0289 Encounter for other administrative examinations: Secondary | ICD-10-CM

## 2017-06-25 ENCOUNTER — Telehealth: Payer: Self-pay | Admitting: Neurology

## 2017-06-28 ENCOUNTER — Ambulatory Visit: Payer: Self-pay | Admitting: Neurology

## 2017-07-15 NOTE — Telephone Encounter (Signed)
Patient no-showed for her appointment on 06/28/17 at 11:00 with Dr. Tomi Likens. Patient has been seeking care at [facility]. Patient did not call back to reschedule her no-show appt.

## 2017-08-04 IMAGING — CR DG CHEST 2V
2 series · 2 of 2 positions shown · non-contrast
Comparison: 01/30/2014 and 01/20/2014.

CLINICAL DATA: Shortness of breath.

EXAM:
CHEST  2 VIEW

[chest pa]
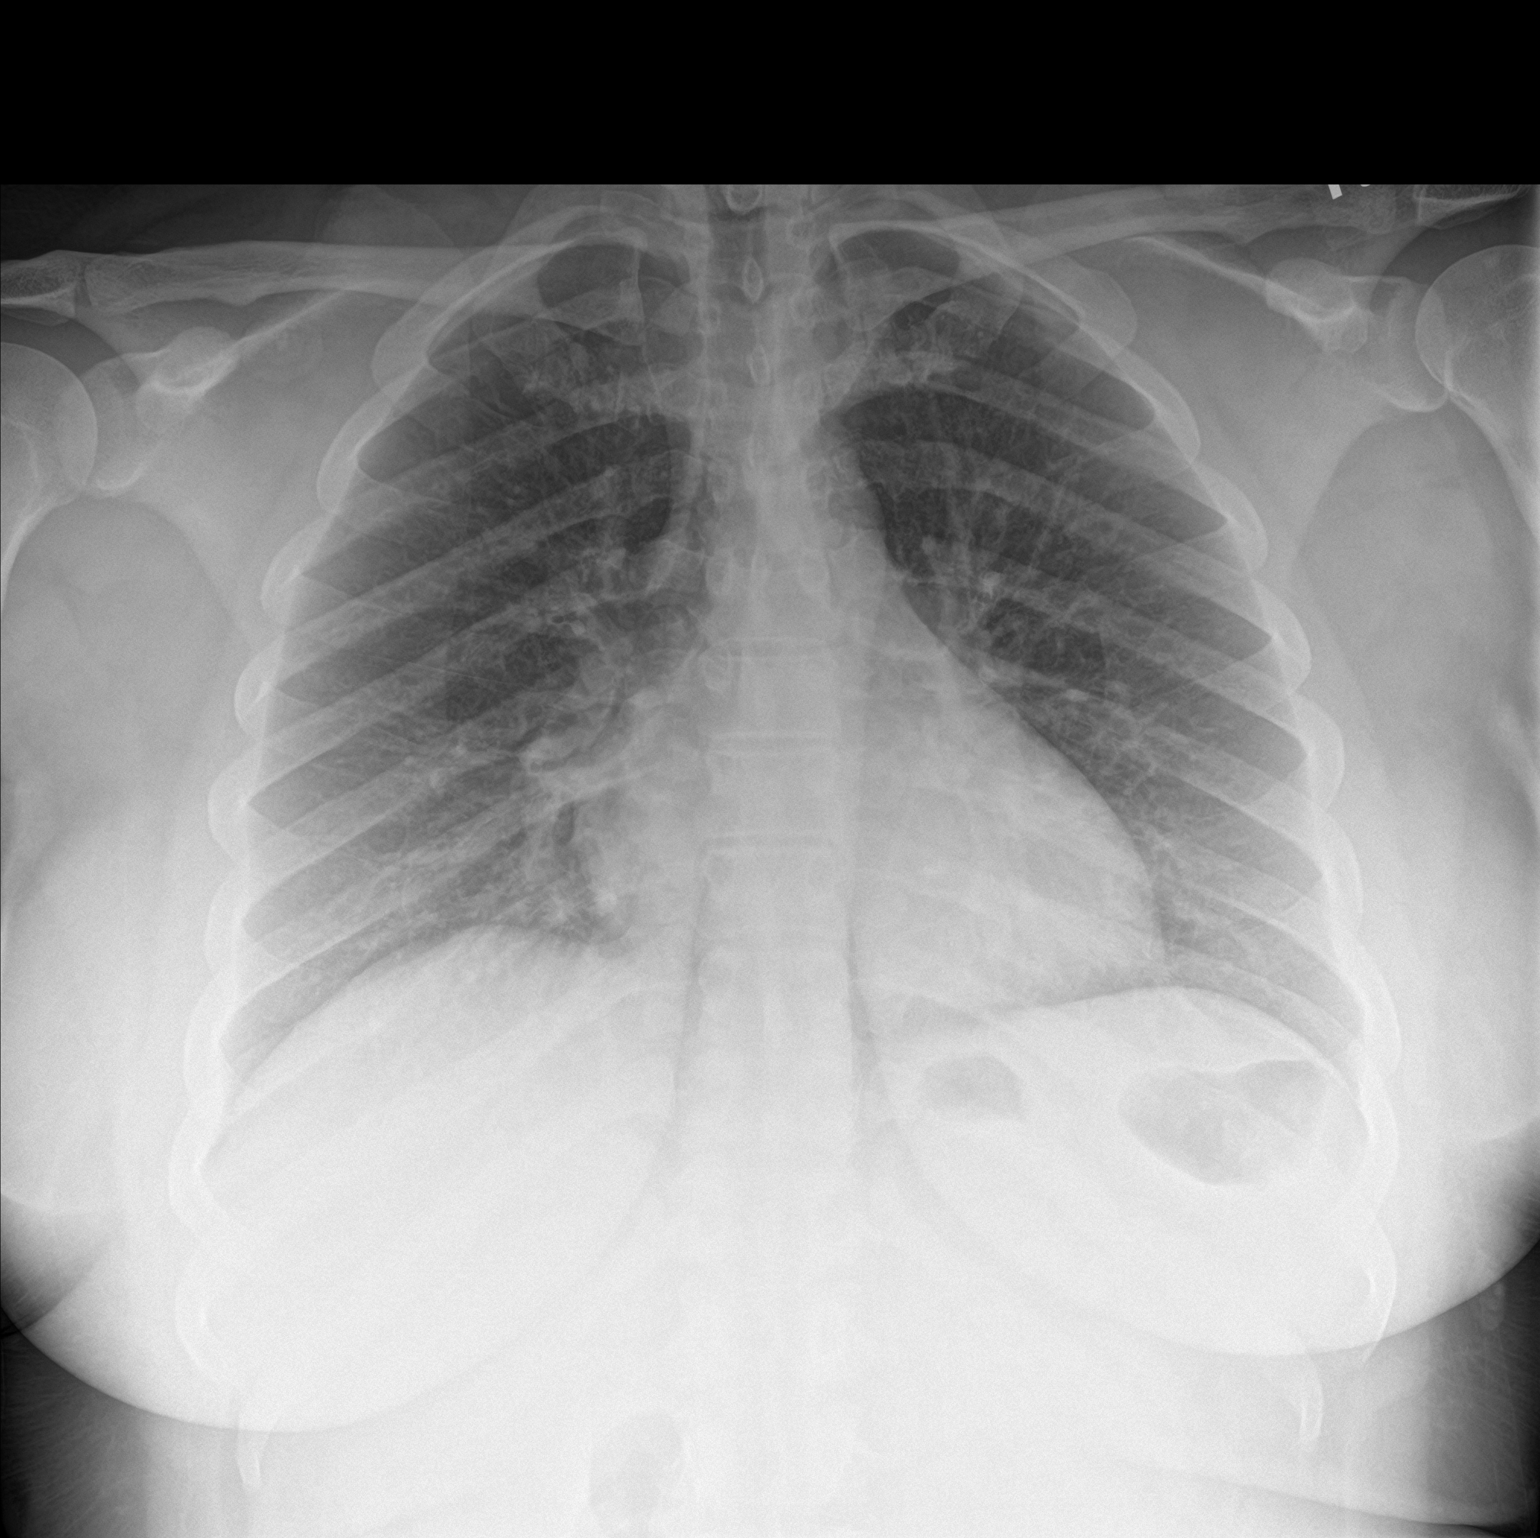

[chest lat]
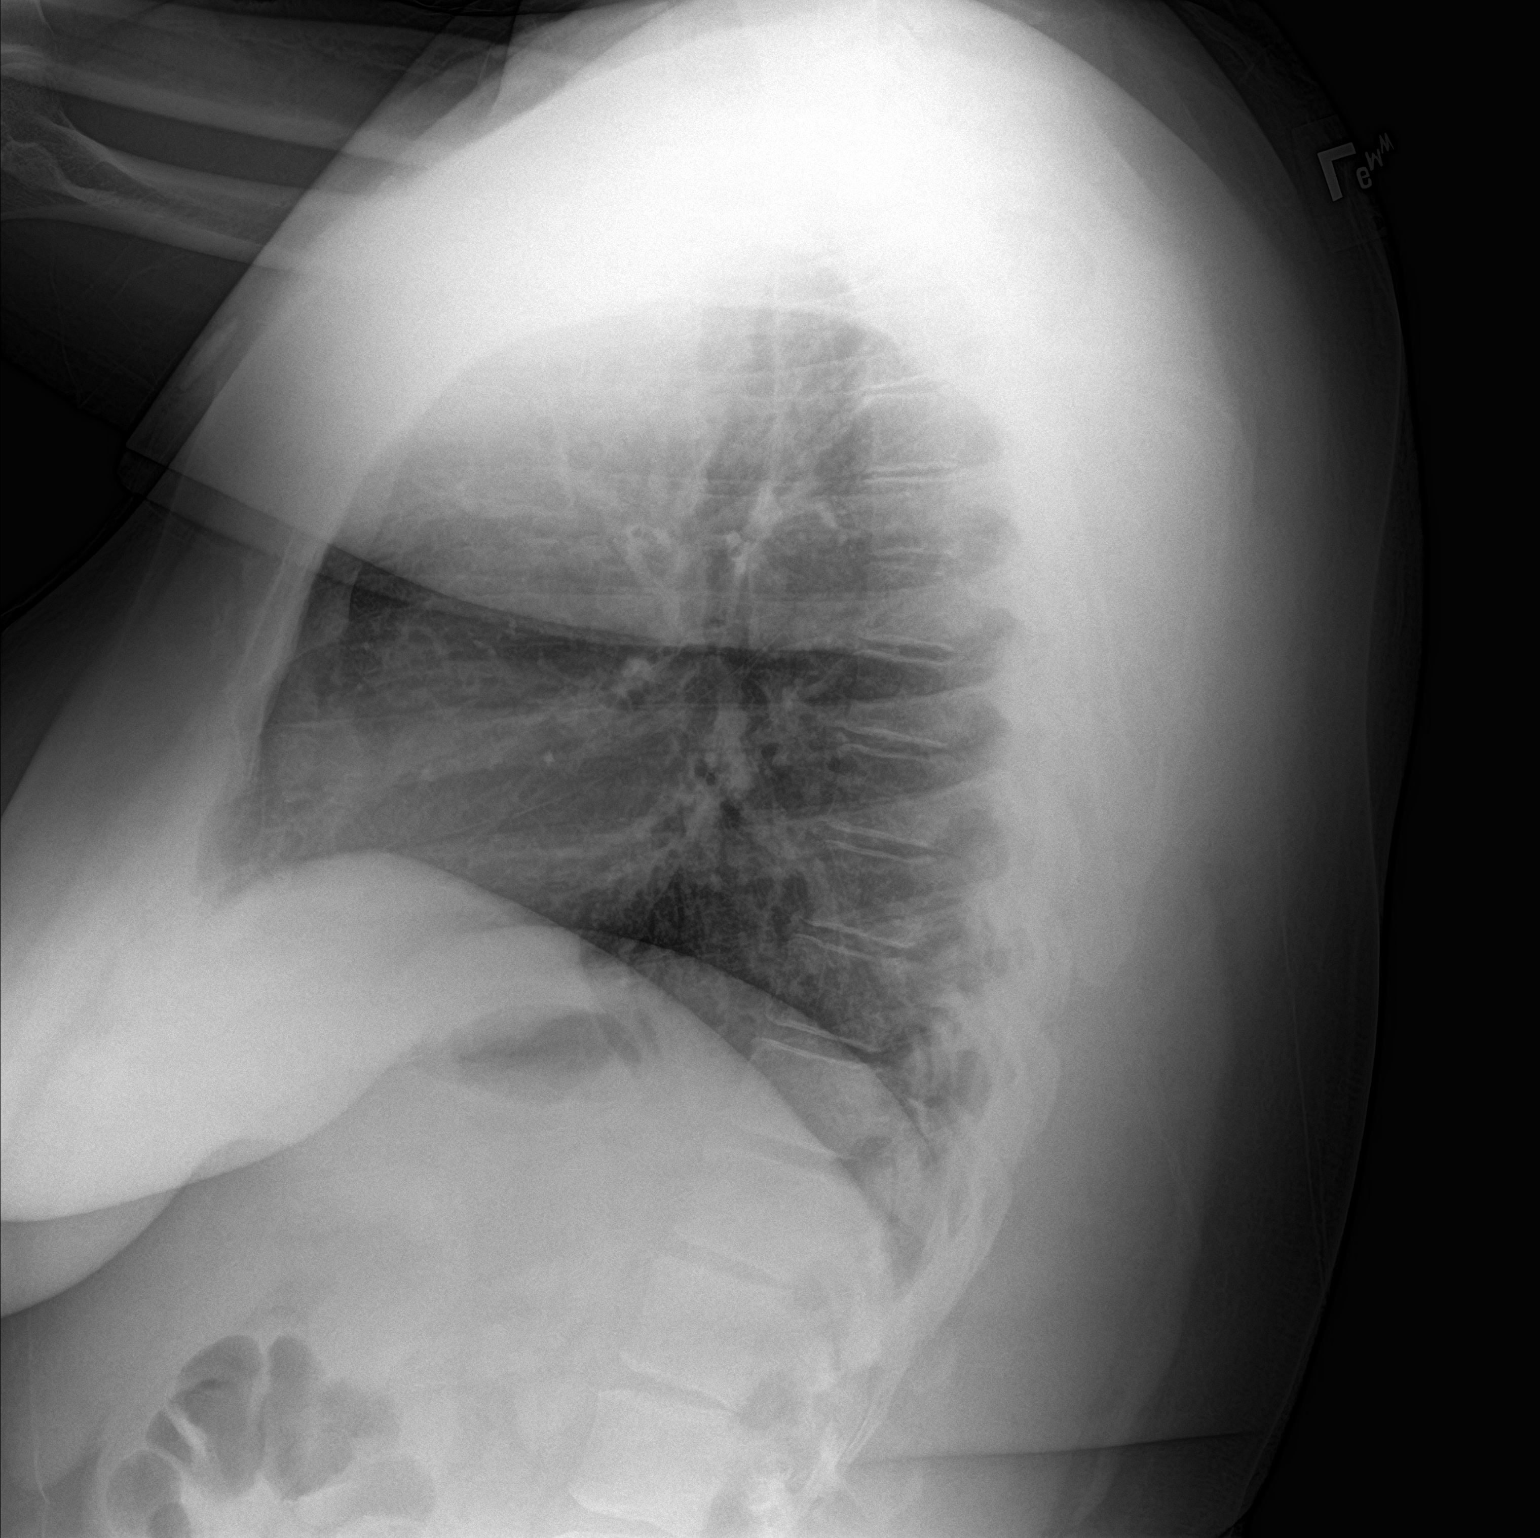

[2 of 2 positions shown; findings below may reference images not displayed]

FINDINGS: The heart size and mediastinal contours are normal. The lungs are
clear. There is no pleural effusion or pneumothorax. No acute
osseous findings are identified.
IMPRESSION: Stable chest.  No active cardiopulmonary process.

## 2017-08-22 ENCOUNTER — Other Ambulatory Visit: Payer: Self-pay

## 2017-08-22 ENCOUNTER — Emergency Department (HOSPITAL_COMMUNITY)
Admission: EM | Admit: 2017-08-22 | Discharge: 2017-08-22 | Disposition: A | Discharge status: Home / Self Care | Payer: 59 | Attending: Emergency Medicine | Admitting: Emergency Medicine

## 2017-08-22 ENCOUNTER — Encounter (HOSPITAL_COMMUNITY): Payer: Self-pay | Admitting: Emergency Medicine

## 2017-08-22 ENCOUNTER — Emergency Department (HOSPITAL_COMMUNITY): Payer: 59

## 2017-08-22 DIAGNOSIS — Z79899 Other long term (current) drug therapy: Secondary | ICD-10-CM | POA: Diagnosis not present

## 2017-08-22 DIAGNOSIS — I10 Essential (primary) hypertension: Secondary | ICD-10-CM | POA: Diagnosis not present

## 2017-08-22 DIAGNOSIS — H6691 Otitis media, unspecified, right ear: Secondary | ICD-10-CM | POA: Insufficient documentation

## 2017-08-22 DIAGNOSIS — F17210 Nicotine dependence, cigarettes, uncomplicated: Secondary | ICD-10-CM | POA: Insufficient documentation

## 2017-08-22 DIAGNOSIS — Z7984 Long term (current) use of oral hypoglycemic drugs: Secondary | ICD-10-CM | POA: Diagnosis not present

## 2017-08-22 DIAGNOSIS — H9201 Otalgia, right ear: Secondary | ICD-10-CM | POA: Diagnosis present

## 2017-08-22 DIAGNOSIS — E119 Type 2 diabetes mellitus without complications: Secondary | ICD-10-CM | POA: Insufficient documentation

## 2017-08-22 HISTORY — DX: Type 2 diabetes mellitus without complications: E11.9

## 2017-08-22 MED ORDER — AMOXICILLIN 500 MG PO CAPS: 1000.0000 mg | ORAL_CAPSULE | Freq: Two times a day (BID) | ORAL | 0 refills | Status: DC

## 2017-08-22 MED ORDER — IBUPROFEN 800 MG PO TABS: 800.0000 mg | ORAL_TABLET | Freq: Three times a day (TID) | ORAL | 0 refills | Status: DC

## 2017-08-22 MED ORDER — FLUTICASONE PROPIONATE 50 MCG/ACT NA SUSP: 1.0000 | Freq: Every day | NASAL | 0 refills | Status: DC

## 2017-08-22 NOTE — ED Notes (Signed)
Pt taken to xray 

## 2017-08-22 NOTE — ED Notes (Signed)
Patient is alert and orientedx4.  Patient was explained discharge instructions and they understood them with no questions.   

## 2017-08-22 NOTE — ED Provider Notes (Signed)
[facility] EMERGENCY DEPARTMENT Provider Note   CSN: [id number] Arrival date & time: 08/22/17  1048     History   Chief Complaint Chief Complaint  Patient presents with  . Otalgia    HPI Krystal Edwards is a 32 y.o. female with a hx of tobacco abuse, DM, PCOS, occipital neuralgia of the right side, and chronic daily headaches who presents to the ED with complaint of URI sxs x 1 week. Patient states she first developed congestion/rhinorrhea, subsequently developed productive cough with green mucous sputum, and R ear fullness/pain. Patient states now R ear is the most irritating. She feels it has been clogged. She started to have throbbing pain to this area yesterday. States pain is moderate. No alleviating/aggravating factors. Denies fever, ear drainage, dyspnea, or chest pain. Patient's daughter recently diagnosed with pneumonia. Triage note mentions headache/facial pain- patient denies this to me.   HPI  Past Medical History:  Diagnosis Date  . Carpal tunnel syndrome 03-2011   BILATERAL RECEIVES INJECTIONS  . Choroid plexus cyst of fetus 08/06/2015  . Diabetes mellitus without complication ([place])   . Headache   . Oligo-ovulation   . Pre-eclampsia 12/02/2015    Patient Active Problem List   Diagnosis Date Noted  . GERD (gastroesophageal reflux disease) 12/28/2016  . PCOS (polycystic ovarian syndrome) 12/28/2016  . Family history of diabetes mellitus 12/28/2016  . Tobacco dependence 12/28/2016  . Essential hypertension 12/28/2016  . DOE (dyspnea on exertion) 12/28/2016  . Diabetes mellitus without complication ([place]) 10/62/6948  . Chronic daily headache 02/12/2016  . Neck pain 08/15/2015  . Occipital neuralgia of right side 08/15/2015  . Laryngopharyngeal reflux (LPR) 02/26/2014  . Cough 02/26/2014    Past Surgical History:  Procedure Laterality Date  . BREAST BIOPSY Left 04/2007   benign  . BREAST SURGERY  2009   left breast bx-benign     OB  History    Gravida  3   Para  1   Term  1   Preterm      AB  2   Living  1     SAB  2   TAB      Ectopic      Multiple  0   Live Births  1            Home Medications    Prior to Admission medications   Medication Sig Start Date End Date Taking? Authorizing Provider  acetaminophen (TYLENOL) 500 MG tablet Take 1,500 mg by mouth every 4 (four) hours as needed for mild pain or moderate pain.    [provider]  irbesartan (AVAPRO) 75 MG tablet Take 1 tablet (75 mg total) by mouth daily. 12/28/16   Binnie Rail, MD  metFORMIN (GLUCOPHAGE) 500 MG tablet Take 1 tablet (500 mg total) 2 (two) times daily with a meal by mouth. 01/29/17   Binnie Rail, MD  omeprazole (PRILOSEC) 40 MG capsule Take 1 capsule (40 mg total) by mouth daily. Take 30 minutes prior to a meal 12/28/16   Burns, Claudina Lick, MD  varenicline (CHANTIX STARTING MONTH PAK) 0.5 MG X 11 & 1 MG X 42 tablet Take one 0.5 mg tab by mouth once daily for 3 days, then increase to one 0.5 mg tab BID for 4 days, then increase to one 1 mg tab BID Patient not taking: Reported on 01/29/2017 12/28/16   Binnie Rail, MD    Family History Family History  Problem Relation Age  of Onset  . Breast cancer Mother 28       2005 AND 2013  . Diabetes Father   . Diabetes Maternal Grandfather   . Breast cancer Paternal Grandmother   . Breast cancer Paternal Grandfather     Social History Social History   Tobacco Use  . Smoking status: Current Every Day Smoker    Packs/day: 0.25    Years: 9.00    Pack years: 2.25    Types: Cigarettes    Last attempt to quit: 01/20/2014    Years since quitting: 3.5  . Smokeless tobacco: Never Used  Substance Use Topics  . Alcohol use: No    Alcohol/week: 0.0 oz  . Drug use: No     Allergies   Patient has no known allergies.   Review of Systems Review of Systems  Constitutional: Negative for chills and fever.  HENT: Positive for congestion, ear pain and rhinorrhea.  Negative for ear discharge, sore throat and trouble swallowing.   Eyes: Negative for visual disturbance.  Respiratory: Positive for cough. Negative for shortness of breath.   Cardiovascular: Negative for chest pain.  Gastrointestinal: Negative for vomiting.  Neurological: Negative for dizziness, weakness, numbness and headaches.    Physical Exam Updated Vital Signs BP (!) 155/93 (BP Location: Right Arm)   Pulse 85   Temp 98.6 F (37 C) (Oral)   Resp 16   SpO2 99%   Physical Exam  Constitutional: She appears well-developed and well-nourished. No distress.  HENT:  Head: Normocephalic and atraumatic.  Right Ear: No drainage, swelling or tenderness. No mastoid tenderness. Tympanic membrane is erythematous (bulging, loss of landmarks). Tympanic membrane is not perforated.  Left Ear: No drainage, swelling or tenderness. No mastoid tenderness. Tympanic membrane is not perforated, not erythematous, not retracted and not bulging.  Nose: Mucosal edema (congestion) present. Right sinus exhibits no maxillary sinus tenderness and no frontal sinus tenderness. Left sinus exhibits no maxillary sinus tenderness and no frontal sinus tenderness.  Mouth/Throat: Uvula is midline and oropharynx is clear and moist. No oropharyngeal exudate or posterior oropharyngeal erythema.  Eyes: Pupils are equal, round, and reactive to light. Conjunctivae are normal. Right eye exhibits no discharge. Left eye exhibits no discharge.  Neck: Normal range of motion and full passive range of motion without pain. Neck supple. No neck rigidity.  Cardiovascular: Normal rate and regular rhythm.  No murmur heard. Pulmonary/Chest: Effort normal and breath sounds normal. No respiratory distress. She has no wheezes. She has no rhonchi. She has no rales.  Abdominal: Soft. She exhibits no distension. There is no tenderness.  Lymphadenopathy:    She has no cervical adenopathy.  Neurological: She is alert.  Skin: Skin is warm and dry.  No rash noted.  Psychiatric: She has a normal mood and affect. Her behavior is normal.  Nursing note and vitals reviewed.   ED Treatments / Results  Labs (all labs ordered are listed, but only abnormal results are displayed) Labs Reviewed - No data to display  EKG None  Radiology Dg Chest 2 View  Result Date: 08/22/2017 CLINICAL DATA:  RIGHT ear pain, headaches. EXAM: CHEST - 2 VIEW COMPARISON:  Chest x-ray dated 04/01/2015. FINDINGS: The heart size and mediastinal contours are within normal limits. Both lungs are clear. The visualized skeletal structures are unremarkable. IMPRESSION: No active cardiopulmonary disease. No evidence of pneumonia or pulmonary edema. Electronically Signed   By: Franki Cabot M.D.   On: 08/22/2017 12:47    Procedures Procedures (including critical care  time)  Medications Ordered in ED Medications - No data to display   Initial Impression / Assessment and Plan / ED Course  I have reviewed the triage vital signs and the nursing notes.  Pertinent labs & imaging results that were available during my care of the patient were reviewed by me and considered in my medical decision making (see chart for details).   Patient presents with URI sxs. Patient nontoxic appearing, in no apparent distress, vitals WNL with the exception of elevated BP, do not suspect HTN emergency, patient aware of need for recheck. Lungs CTA, no respiratory distress, CXR negative for infiltrate, doubt pneumonia. Patient does have findings consistent right sided AOM, no evidence of mastoiditis or meningitis. Will treat with Amoxicillin, Flonase, and Ibuprofen. I discussed results, treatment plan, need for PCP follow-up, and return precautions with the patient. Provided opportunity for questions, patient confirmed understanding and is in agreement with plan.    Final Clinical Impressions(s) / ED Diagnoses   Final diagnoses:  Right otitis media, unspecified otitis media type    ED  Discharge Orders        Ordered    amoxicillin (AMOXIL) 500 MG capsule  2 times daily     08/22/17 1324    fluticasone (FLONASE) 50 MCG/ACT nasal spray  Daily     08/22/17 1324    ibuprofen (ADVIL,MOTRIN) 800 MG tablet  3 times daily     08/22/17 1324       Petrucelli, [place] R, PA-C 08/22/17 1329    Orlie Dakin, MD 08/22/17 1623

## 2017-08-22 NOTE — Discharge Instructions (Signed)
You were seen in the ER and diagnosed with an ear infection of the R ear. Your chest x-ray was normal. We are treating you with amoxicillin. Please take all of your antibiotics until finished. You may develop abdominal discomfort or diarrhea from the antibiotic.  You may help offset this with probiotics which you can buy at the store (ask your pharmacist if unable to find) or get probiotics in the form of eating yogurt. Do not eat or take the probiotics until 2 hours after your antibiotic. If you are unable to tolerate these side effects follow-up with your primary care provider or return to the emergency department.   If you begin to experience any blistering, rashes, swelling, or difficulty breathing seek medical care for evaluation of potentially more serious side effects.   Please be aware that this medication may interact with other medications you are taking, please be sure to discuss your medication list with your pharmacist.   Additionally we are sending you home with ibuprofen 800 mg take this every 8 hours as needed for pain- take with food and do not take other NSAIDs (motrin, advil, aleve, mobic, naproxen etc,) with this medicine. Also use flonase for nasal congestion.   Follow up with your primary care provider in 1 week for re-evaluation. Return to the ER for new or worsening symptoms or any other concerns.   Additionally have your blood pressure rechecked by your primary care as it was elevated in the Er.    Vitals:   08/22/17 1322  BP: (!) 155/93  Pulse: 85  Resp: 16  Temp: 98.6 F (37 C)  SpO2: 99%

## 2017-08-22 NOTE — ED Triage Notes (Signed)
Patient complains of right ear pain and it being "stopped up" since last Sunday. Patient states over the last few days she is also now having headaches and sharp pain on the right side on her face.  Patient alert and oriented and in no apparent distress at this time.

## 2017-09-28 ENCOUNTER — Encounter: Payer: Self-pay | Admitting: Internal Medicine

## 2017-09-28 ENCOUNTER — Other Ambulatory Visit (INDEPENDENT_AMBULATORY_CARE_PROVIDER_SITE_OTHER): Payer: 59

## 2017-09-28 ENCOUNTER — Ambulatory Visit (INDEPENDENT_AMBULATORY_CARE_PROVIDER_SITE_OTHER)
Admission: RE | Admit: 2017-09-28 | Discharge: 2017-09-28 | Disposition: A | Discharge status: Home / Self Care | Payer: 59 | Source: Ambulatory Visit | Attending: Internal Medicine | Admitting: Internal Medicine

## 2017-09-28 ENCOUNTER — Ambulatory Visit: Payer: 59 | Admitting: Internal Medicine

## 2017-09-28 ENCOUNTER — Encounter: Payer: Self-pay | Admitting: Emergency Medicine

## 2017-09-28 VITALS — BP 192/110 | HR 77 | Temp 98.3°F | Resp 16 | Wt 287.0 lb

## 2017-09-28 DIAGNOSIS — R1033 Periumbilical pain: Secondary | ICD-10-CM | POA: Insufficient documentation

## 2017-09-28 DIAGNOSIS — I10 Essential (primary) hypertension: Secondary | ICD-10-CM | POA: Diagnosis not present

## 2017-09-28 LAB — CBC WITH DIFFERENTIAL/PLATELET
BASOS PCT: 0.3 % (ref 0.0–3.0)
Basophils Absolute: 0 10*3/uL (ref 0.0–0.1)
EOS PCT: 3.5 % (ref 0.0–5.0)
Eosinophils Absolute: 0.3 10*3/uL (ref 0.0–0.7)
HEMATOCRIT: 39.4 % (ref 36.0–46.0)
HEMOGLOBIN: 13.2 g/dL (ref 12.0–15.0)
LYMPHS ABS: 2.9 10*3/uL (ref 0.7–4.0)
Lymphocytes Relative: 29.6 % (ref 12.0–46.0)
MCHC: 33.5 g/dL (ref 30.0–36.0)
MCV: 82.6 fl (ref 78.0–100.0)
Monocytes Absolute: 0.5 10*3/uL (ref 0.1–1.0)
Monocytes Relative: 4.9 % (ref 3.0–12.0)
NEUTROS ABS: 6 10*3/uL (ref 1.4–7.7)
Neutrophils Relative %: 61.7 % (ref 43.0–77.0)
Platelets: 313 10*3/uL (ref 150.0–400.0)
RBC: 4.77 Mil/uL (ref 3.87–5.11)
RDW: 13.3 % (ref 11.5–15.5)
WBC: 9.7 10*3/uL (ref 4.0–10.5)

## 2017-09-28 LAB — COMPREHENSIVE METABOLIC PANEL
ALBUMIN: 4.1 g/dL (ref 3.5–5.2)
ALK PHOS: 52 U/L (ref 39–117)
ALT: 39 U/L — ABNORMAL HIGH (ref 0–35)
AST: 28 U/L (ref 0–37)
BUN: 9 mg/dL (ref 6–23)
CALCIUM: 9.4 mg/dL (ref 8.4–10.5)
CO2: 28 mEq/L (ref 19–32)
CREATININE: 0.72 mg/dL (ref 0.40–1.20)
Chloride: 102 mEq/L (ref 96–112)
GFR: 118.93 mL/min (ref >60.00)
Glucose, Bld: 197 mg/dL — ABNORMAL HIGH (ref 70–99)
Potassium: 4 mEq/L (ref 3.5–5.1)
SODIUM: 135 meq/L (ref 135–145)
TOTAL PROTEIN: 7.6 g/dL (ref 6.0–8.3)
Total Bilirubin: 0.5 mg/dL (ref 0.2–1.2)

## 2017-09-28 LAB — LIPASE: LIPASE: 32 U/L (ref 11.0–59.0)

## 2017-09-28 LAB — AMYLASE: Amylase: 41 U/L (ref 27–131)

## 2017-09-28 MED ORDER — IRBESARTAN 75 MG PO TABS: 150.0000 mg | ORAL_TABLET | Freq: Every day | ORAL | 2 refills | Status: DC

## 2017-09-28 MED ORDER — IOPAMIDOL (ISOVUE-300) INJECTION 61%
100.0000 mL | Freq: Once | INTRAVENOUS | Status: AC | PRN
  Administered 2017-09-28: 100 mL via INTRAVENOUS

## 2017-09-28 NOTE — Assessment & Plan Note (Signed)
Blood pressure very high here today and her pain is likely contributing, but I still think overall her pain blood pressure is not well controlled Increase Avapro to 150 mg daily She has a follow-up later this month

## 2017-09-28 NOTE — Patient Instructions (Addendum)
Increase avapro to 150 mg daily (2 pills daily).   Have blood work done today.   You received an injection of pain medication today - Toradol.     A ct scan was ordered.  We will call you with the results.

## 2017-09-28 NOTE — Progress Notes (Signed)
Subjective:    Patient ID: Krystal Edwards, female    DOB: 12-11-1985, 32 y.o.   MRN: [id number]  HPI The patient is here for an acute visit.   After she had her baby 2 years ago she had umbilical pain.  Since that time she has had intermittent pain near the umbilicus.  It comes and goes.  The past three days that pain has been intense.  The pain radiates outward from her umbilicus.  It was severe last nght and she was not able to sleep - it was worse when she laid down.   She is having diarrhea - dark stool - started 3 days ago.  She has had nausea x 3 days and vomited today.  She feels dizzy and has a headache.  She did have a fever last night.  She can not eat much.  Took ibuprofen for fever last night, but she is not sure if it helped the pain.   She denies any dysuria, hematuria or blood in stool.  Hypertension: She states she is taking her medication daily as prescribed.  She states her blood pressure has been elevated at home.  She is not compliant with a low-sodium diet.  She is not exercising regularly.  Medications and allergies reviewed with patient and updated if appropriate.  Patient Active Problem List   Diagnosis Date Noted  . GERD (gastroesophageal reflux disease) 12/28/2016  . PCOS (polycystic ovarian syndrome) 12/28/2016  . Family history of diabetes mellitus 12/28/2016  . Tobacco dependence 12/28/2016  . Essential hypertension 12/28/2016  . DOE (dyspnea on exertion) 12/28/2016  . Diabetes mellitus without complication ([place]) 29/92/4268  . Chronic daily headache 02/12/2016  . Neck pain 08/15/2015  . Occipital neuralgia of right side 08/15/2015  . Laryngopharyngeal reflux (LPR) 02/26/2014  . Cough 02/26/2014    Current Outpatient Medications on File Prior to Visit  Medication Sig Dispense Refill  . acetaminophen (TYLENOL) 500 MG tablet Take 1,500 mg by mouth every 4 (four) hours as needed for mild pain or moderate pain.    . fluticasone (FLONASE) 50 MCG/ACT  nasal spray Place 1 spray into both nostrils daily. 16 g 0  . ibuprofen (ADVIL,MOTRIN) 800 MG tablet Take 1 tablet (800 mg total) by mouth 3 (three) times daily. 21 tablet 0  . irbesartan (AVAPRO) 75 MG tablet Take 1 tablet (75 mg total) by mouth daily. 30 tablet 2  . metFORMIN (GLUCOPHAGE) 500 MG tablet Take 1 tablet (500 mg total) 2 (two) times daily with a meal by mouth. 60 tablet 5  . omeprazole (PRILOSEC) 40 MG capsule Take 1 capsule (40 mg total) by mouth daily. Take 30 minutes prior to a meal 30 capsule 3   No current facility-administered medications on file prior to visit.     Past Medical History:  Diagnosis Date  . Carpal tunnel syndrome 03-2011   BILATERAL RECEIVES INJECTIONS  . Choroid plexus cyst of fetus 08/06/2015  . Diabetes mellitus without complication ([place])   . Headache   . Oligo-ovulation   . Pre-eclampsia 12/02/2015    Past Surgical History:  Procedure Laterality Date  . BREAST BIOPSY Left 04/2007   benign  . BREAST SURGERY  2009   left breast bx-benign    Social History   Socioeconomic History  . Marital status: Single    Spouse name: n/a  . Number of children: 0  . Years of education: 23  . Highest education level: Not on file  Occupational History  .  Occupation: Armed forces technical officer: [employer]  . Financial resource strain: Not on file  . Food insecurity:    Worry: Not on file    Inability: Not on file  . Transportation needs:    Medical: Not on file    Non-medical: Not on file  Tobacco Use  . Smoking status: Current Every Day Smoker    Packs/day: 0.25    Years: 9.00    Pack years: 2.25    Types: Cigarettes    Last attempt to quit: 01/20/2014    Years since quitting: 3.6  . Smokeless tobacco: Never Used  Substance and Sexual Activity  . Alcohol use: No    Alcohol/week: 0.0 oz  . Drug use: No  . Sexual activity: Yes    Partners: Male    Birth control/protection: None  Lifestyle  . Physical activity:    Days per  week: Not on file    Minutes per session: Not on file  . Stress: Not on file  Relationships  . Social connections:    Talks on phone: Not on file    Gets together: Not on file    Attends religious service: Not on file    Active member of club or organization: Not on file    Attends meetings of clubs or organizations: Not on file    Relationship status: Not on file  Other Topics Concern  . Not on file  Social History Narrative   Lives with father and brother. Mother lives with her sister, also in [place].    Family History  Problem Relation Age of Onset  . Breast cancer Mother 20       2005 AND 2013  . Diabetes Father   . Diabetes Maternal Grandfather   . Breast cancer Paternal Grandmother   . Breast cancer Paternal Grandfather     Review of Systems  Constitutional: Positive for appetite change and fever (last night 103).  Respiratory: Positive for cough (from smoking.) and shortness of breath (chronic). Negative for wheezing.   Cardiovascular: Negative for chest pain and palpitations.  Gastrointestinal: Positive for abdominal pain, diarrhea, nausea and vomiting. Negative for blood in stool (dark - ? black stool).  Genitourinary: Negative for dysuria and hematuria.  Neurological: Positive for dizziness, light-headedness and headaches.       Objective:   Vitals:   09/28/17 1059  BP: (!) 192/110  Pulse: 77  Resp: 16  Temp: 98.3 F (36.8 C)  SpO2: 98%   BP Readings from Last 3 Encounters:  09/28/17 (!) 192/110  08/22/17 (!) 155/93  01/29/17 (!) 144/96   Wt Readings from Last 3 Encounters:  09/28/17 287 lb (130.2 kg)  01/29/17 288 lb (130.6 kg)  12/28/16 286 lb (129.7 kg)   Body mass index is 44.95 kg/m.   Physical Exam  Constitutional: She appears well-developed and well-nourished. She appears distressed (In moderate pain).  HENT:  Head: Normocephalic and atraumatic.  Eyes: No scleral icterus.  Cardiovascular: Normal rate and regular rhythm.  No  murmur heard. Pulmonary/Chest: Effort normal and breath sounds normal. No respiratory distress. She has no wheezes. She has no rales.  Abdominal: Soft. There is tenderness (Severe tenderness in the umbilical area-moderate tenderness right middle abdomen near umbilical region). There is no rebound and no guarding. A hernia (? umbilical hernia) is present.  Obese  Skin: Skin is warm and dry. She is not diaphoretic. No erythema.  Assessment & Plan:    See Problem List for Assessment and Plan of chronic medical problems.

## 2017-09-28 NOTE — Assessment & Plan Note (Signed)
Severe umbilical pain Since her baby was born she has had some intermittent umbilical pain and it became severe 3 days ago and persistent Sounds like a hernia with possible incarceration given the degree of pain She would like to avoid the emergency room unless she absolutely needs it Will get CBC, CMP, amylase and lipase stat CT the abdomen and pelvis with contrast to evaluate for incarcerated hernia or possible other causes of her pain May need surgical evaluation Toradol 60 mg injection now for her pain Depending on results we will determine further pain management

## 2017-09-29 ENCOUNTER — Other Ambulatory Visit: Payer: Self-pay | Admitting: Internal Medicine

## 2017-09-29 DIAGNOSIS — R1033 Periumbilical pain: Secondary | ICD-10-CM

## 2017-09-29 MED ORDER — TRAMADOL HCL 50 MG PO TABS: 50.0000 mg | ORAL_TABLET | Freq: Three times a day (TID) | ORAL | 0 refills | Status: DC | PRN

## 2017-09-29 MED ORDER — KETOROLAC TROMETHAMINE 60 MG/2ML IM SOLN
60.0000 mg | Freq: Once | INTRAMUSCULAR | Status: AC
  Administered 2017-09-28: 60 mg via INTRAMUSCULAR

## 2017-09-29 NOTE — Addendum Note (Signed)
Addended by: Terence Lux B on: 09/29/2017 07:32 AM   Modules accepted: Orders

## 2017-09-30 ENCOUNTER — Other Ambulatory Visit: Payer: Self-pay | Admitting: Emergency Medicine

## 2017-09-30 MED ORDER — IRBESARTAN 75 MG PO TABS: 150.0000 mg | ORAL_TABLET | Freq: Every day | ORAL | 2 refills | Status: DC

## 2017-10-05 ENCOUNTER — Telehealth: Payer: Self-pay | Admitting: Internal Medicine

## 2017-10-05 DIAGNOSIS — Z0279 Encounter for issue of other medical certificate: Secondary | ICD-10-CM

## 2017-10-05 NOTE — Telephone Encounter (Signed)
FMLA has been completed & given to provider to sign.   The forms are for doctor's appointment due to Umbilical Pain from OV on 09/28/17.

## 2017-10-05 NOTE — Telephone Encounter (Signed)
Forms have been signed, Faxed to [employer] @[phone], Copy sent to scan & charged for.   The FMLA forms for surgery leave have been faxed to [facility].

## 2017-10-06 ENCOUNTER — Ambulatory Visit: Payer: Self-pay | Admitting: Surgery

## 2017-10-06 NOTE — H&P (Signed)
History of Present Illness Krystal Edwards. Krystal Edwards; 10/06/2017 5:52 PM) The patient is a 32 year old female who presents with an umbilical hernia. Ref: Krystal Gosling, Edwards for umbilical hernia  This is a 32 year old female with morbid obesity who presents with 2 years of intermittent mild abdominal pain around her umbilicus. Over the last couple of weeks this has become more severe. The pain radiates outward from her umbilicus. She also has some diarrhea and mild nausea. She presented to her primary care physician for evaluation. She was found to be very tender around her umbilicus with a small palpable umbilical hernia. She underwent a CT scan that showed ovarian cysts as well as a small periumbilical hernia here she also has significant fatty liver. The patient states that her umbilical pain gets worse when she starts her period or after she eats. The pain is always located in the same spot.  CLINICAL DATA: Mid abdominal pain, nausea, decreased appetite some periumbilical pain  EXAM: CT ABDOMEN AND PELVIS WITH CONTRAST  TECHNIQUE: Multidetector CT imaging of the abdomen and pelvis was performed using the standard protocol following bolus administration of intravenous contrast.  CONTRAST: 100 cc Isovue-300  COMPARISON: None.  FINDINGS: Lower chest: The lung bases are clear. The heart is within normal limits in size. No pericardial effusion is seen.  Hepatobiliary: The liver is diffusely low in attenuation most consistent with hepatic steatosis with some sparing. No calcified gallstones are noted within the slightly contracted gallbladder.  Pancreas: The pancreas appears normal in size and the pancreatic duct is not dilated.  Spleen: The spleen is unremarkable.  Adrenals/Urinary Tract: The adrenal glands appear normal. The kidneys enhance with no calculus and no mass. No hydronephrosis is seen. The ureters appear normal in caliber to the urinary bladder. The urinary bladder is  not well distended but no abnormality is evident.  Stomach/Bowel: The stomach is moderately distended with fluid and oral contrast. No abnormality is seen. No distention of small bowel is noted. There is a small collection of fat within the anterior left pelvis on image 72 series 2 which is near the sigmoid colon. This may represent a small focus of appendagitis epiploica. No abnormality of the colon is seen. The terminal ileum is unremarkable and the appendix is normal in caliber with no inflammatory process noted.  Vascular/Lymphatic: The abdominal aorta is normal in caliber. No adenopathy is seen with only small mesenteric lymph nodes present.  Reproductive: The uterus is unremarkable. Low-attenuation centrally most likely is related to the patient's menstrual cycle. There are complex ovarian cysts present bilaterally. On the right the complex ovarian cyst measures approximately 2.9 cm and on the left more anteriorly positioned, a complex cyst measures 3.2 cm. No free fluid is seen within the pelvis. A small amount air is noted within the vagina.  Other: There is a small left periumbilical hernia containing only fat present. No other abdominal wall hernia is seen.  Musculoskeletal: The lumbar vertebrae are in normal alignment with normal intervertebral disc spaces. No pars defect is seen.  IMPRESSION: 1. No definite explanation for the patient's mid abdominal pain is seen. There is a small collection of strandy fat within the anterior left lower pelvis which could represent a small focus of appendagitis epiploica of doubtful clinical significance. 2. Probable severe hepatic steatosis. Correlate with LFTs. 3. No renal or ureteral calculi are seen. 4. Small complex ovarian cysts bilaterally. No free fluid. 5. Tiny left periumbilical hernia containing only fat.   Electronically Signed  By: Ivar Drape M.D. On: 09/28/2017 16:09    Past Surgical History Krystal Edwards;  10/06/2017 2:55 PM) Breast Biopsy Bilateral. Foot Surgery Right. Oral Surgery  Diagnostic Studies History Krystal Edwards; 10/06/2017 2:55 PM) Colonoscopy never Mammogram within last year Pap Smear 1-5 years ago  Allergies Krystal Edwards; 10/06/2017 2:56 PM) No Known Drug Allergies [10/06/2017]: Allergies Reconciled  Medication History Krystal Edwards; 10/06/2017 2:56 PM) Irbesartan (75MG  Tablet, Oral) Active. TraMADol HCl (50MG  Tablet, Oral) Active. Medications Reconciled  Social History Krystal Edwards; 10/06/2017 2:55 PM) Alcohol use Occasional alcohol use. Caffeine use Carbonated beverages, Tea. No drug use Tobacco use Current every day smoker.  Family History Krystal Edwards; 10/06/2017 2:55 PM) Breast Cancer Mother. Diabetes Mellitus Father, Mother. Hypertension Father.  Pregnancy / Birth History Krystal Edwards; 10/06/2017 2:55 PM) Age at menarche 68 years. Gravida 1 Maternal age 79-35 Para 1 Regular periods  Other Problems Krystal Edwards; 10/06/2017 2:55 PM) Back Pain Diabetes Mellitus High blood pressure Lump In Breast Migraine Headache Umbilical Hernia Repair     Review of Systems Krystal Edwards; 10/06/2017 2:56 PM) General Present- Fatigue and Weight Gain. Not Present- Appetite Loss, Chills, Fever, Night Sweats and Weight Loss. Skin Not Present- Change in Wart/Mole, Dryness, Hives, Jaundice, New Lesions, Non-Healing Wounds, Rash and Ulcer. HEENT Present- Seasonal Allergies and Wears glasses/contact lenses. Not Present- Earache, Hearing Loss, Hoarseness, Nose Bleed, Oral Ulcers, Ringing in the Ears, Sinus Pain, Sore Throat, Visual Disturbances and Yellow Eyes. Respiratory Present- Snoring. Not Present- Bloody sputum, Chronic Cough, Difficulty Breathing and Wheezing. Breast Not Present- Breast Mass, Breast Pain, Nipple Discharge and Skin Changes. Cardiovascular Present- Leg Cramps, Shortness of Breath and Swelling of Extremities. Not  Present- Chest Pain, Difficulty Breathing Lying Down, Palpitations and Rapid Heart Rate. Gastrointestinal Present- Abdominal Pain, Bloating and Nausea. Not Present- Bloody Stool, Change in Bowel Habits, Chronic diarrhea, Constipation, Difficulty Swallowing, Excessive gas, Gets full quickly at meals, Hemorrhoids, Indigestion, Rectal Pain and Vomiting. Female Genitourinary Not Present- Frequency, Nocturia, Painful Urination, Pelvic Pain and Urgency. Musculoskeletal Present- Back Pain and Muscle Pain. Not Present- Joint Pain, Joint Stiffness, Muscle Weakness and Swelling of Extremities. Neurological Present- Headaches. Not Present- Decreased Memory, Fainting, Numbness, Seizures, Tingling, Tremor, Trouble walking and Weakness. Endocrine Present- Heat Intolerance, Hot flashes and New Diabetes. Not Present- Cold Intolerance, Excessive Hunger and Hair Changes. Hematology Not Present- Blood Thinners, Easy Bruising, Excessive bleeding, Gland problems, HIV and Persistent Infections.  Vitals Krystal Edwards; 10/06/2017 2:57 PM) 10/06/2017 2:56 PM Weight: 283.5 lb Height: 67in Body Surface Area: 2.35 m Body Mass Index: 44.4 kg/m  Temp.: 98.52F(Oral)  Pulse: 103 (Regular)  BP: 130/82 (Sitting, Left Arm, Standard)      Physical Exam Rodman Key K. Veleria Barnhardt Edwards; 10/06/2017 5:53 PM)  The physical exam findings are as follows: Note:Obese WDWN in NAD Eyes: Pupils equal, round; sclera anicteric HENT: Oral mucosa moist; good dentition Neck: No masses palpated, no thyromegaly Lungs: CTA bilaterally; normal respiratory effort CV: Regular rate and rhythm; no murmurs; extremities well-perfused with no edema Abd: +bowel sounds, soft, non-tender, no palpable organomegaly; small palpable umbilical hernia - non-reducible; tender to palpation; no skin changes Skin: Warm, dry; no sign of jaundice Psychiatric - alert and oriented x 4; calm mood and affect    Assessment & Plan Rodman Key K. Kellyn Mansfield Edwards; 9/37/1696  7:89 PM)  UMBILICAL HERNIA, INCARCERATED (K42.0)  Current Plans Schedule for Surgery - Umbilical hernia repair with mesh. The surgical procedure has been discussed with the patient. Potential risks, benefits, alternative treatments, and expected outcomes have  been explained. All of the patient's questions at this time have been answered. The likelihood of reaching the patient's treatment goal is good. The patient understand the proposed surgical procedure and wishes to proceed.  Krystal Edwards. Georgette Dover, Edwards, [facility]  General/ Trauma Surgery  10/06/2017 5:53 PM

## 2017-10-16 NOTE — Progress Notes (Signed)
Subjective:    Patient ID: Krystal Edwards, female    DOB: May 14, 1985, 32 y.o.   MRN: [id number]  HPI The patient is here for follow up.  Was having periumbilical pain - saw surgery and the pain is from her hernia.  She will have surgery next month.  She still has pain and is taking tramadol as needed.    Hypertension: We increased her avapro dose three weeks ago.  She is taking her medication daily. She is compliant with a low sodium diet.  She is experiencing atypical chest pain - lasts one sec and is sharp in nature.  She denies  palpitations, shortness of breath and lightheadedness.  She does have frequent headaches. She is exercising - walking.  She does not monitor her blood pressure at home.    Diabetes: She is not taking her medication daily as prescribed.  She is unsure why it was stopped, but most likely was secondary to having the CT scan done and she did not restart it.  She is more compliant with a diabetic diet. She is exercising regularly - walking.   GERD:  She is taking her medication daily as prescribed.  She denies any GERD symptoms on a regular basis and feels her GERD is well controlled.   Tobacco abuse:  She has cut down on her smoking, but is still smoking.  She is trying to get Chantix approved with her insurance company, but in the meantime may try a nicotine patches.    ? Sleep apnea:  She snores and her mother has noticed that she has apnea.  She is always tired no matter how much she sleeps.  Medications and allergies reviewed with patient and updated if appropriate.  Patient Active Problem List   Diagnosis Date Noted  . Snores 10/18/2017  . Periumbilical abdominal pain 09/28/2017  . GERD (gastroesophageal reflux disease) 12/28/2016  . PCOS (polycystic ovarian syndrome) 12/28/2016  . Family history of diabetes mellitus 12/28/2016  . Tobacco dependence 12/28/2016  . Essential hypertension 12/28/2016  . DOE (dyspnea on exertion) 12/28/2016  . Diabetes  mellitus without complication ([place]) 33/82/5053  . Chronic daily headache 02/12/2016  . Neck pain 08/15/2015  . Occipital neuralgia of right side 08/15/2015  . Laryngopharyngeal reflux (LPR) 02/26/2014  . Cough 02/26/2014    Current Outpatient Medications on File Prior to Visit  Medication Sig Dispense Refill  . acetaminophen (TYLENOL) 500 MG tablet Take 1,500 mg by mouth every 4 (four) hours as needed for mild pain or moderate pain.    . fluticasone (FLONASE) 50 MCG/ACT nasal spray Place 1 spray into both nostrils daily. 16 g 0  . ibuprofen (ADVIL,MOTRIN) 800 MG tablet Take 1 tablet (800 mg total) by mouth 3 (three) times daily. 21 tablet 0  . metFORMIN (GLUCOPHAGE) 500 MG tablet Take 1 tablet (500 mg total) 2 (two) times daily with a meal by mouth. 60 tablet 5  . omeprazole (PRILOSEC) 40 MG capsule Take 1 capsule (40 mg total) by mouth daily. Take 30 minutes prior to a meal 30 capsule 3  . traMADol (ULTRAM) 50 MG tablet Take 1-2 tablets (50-100 mg total) by mouth every 8 (eight) hours as needed. 30 tablet 0   No current facility-administered medications on file prior to visit.     Past Medical History:  Diagnosis Date  . Carpal tunnel syndrome 03-2011   BILATERAL RECEIVES INJECTIONS  . Choroid plexus cyst of fetus 08/06/2015  . Diabetes mellitus without complication ([place])   .  Headache   . Oligo-ovulation   . Pre-eclampsia 12/02/2015    Past Surgical History:  Procedure Laterality Date  . BREAST BIOPSY Left 04/2007   benign  . BREAST SURGERY  2009   left breast bx-benign    Social History   Socioeconomic History  . Marital status: Single    Spouse name: n/a  . Number of children: 0  . Years of education: 63  . Highest education level: Not on file  Occupational History  . Occupation: Armed forces technical officer: [employer]  . Financial resource strain: Not on file  . Food insecurity:    Worry: Not on file    Inability: Not on file  . Transportation  needs:    Medical: Not on file    Non-medical: Not on file  Tobacco Use  . Smoking status: Current Every Day Smoker    Packs/day: 0.25    Years: 9.00    Pack years: 2.25    Types: Cigarettes    Last attempt to quit: 01/20/2014    Years since quitting: 3.7  . Smokeless tobacco: Never Used  Substance and Sexual Activity  . Alcohol use: No    Alcohol/week: 0.0 oz  . Drug use: No  . Sexual activity: Yes    Partners: Male    Birth control/protection: None  Lifestyle  . Physical activity:    Days per week: Not on file    Minutes per session: Not on file  . Stress: Not on file  Relationships  . Social connections:    Talks on phone: Not on file    Gets together: Not on file    Attends religious service: Not on file    Active member of club or organization: Not on file    Attends meetings of clubs or organizations: Not on file    Relationship status: Not on file  Other Topics Concern  . Not on file  Social History Narrative   Lives with father and brother. Mother lives with her sister, also in [place].    Family History  Problem Relation Age of Onset  . Breast cancer Mother 60       2005 AND 2013  . Diabetes Father   . Diabetes Maternal Grandfather   . Breast cancer Paternal Grandmother   . Breast cancer Paternal Grandfather     Review of Systems  Constitutional: Negative for chills and fever.  Respiratory: Positive for shortness of breath (no change). Negative for cough and wheezing.   Cardiovascular: Positive for chest pain (sharp transient pain through chest- lasts 1 sec - occurs randomly) and leg swelling (ankles and feet). Negative for palpitations.  Neurological: Positive for headaches. Negative for light-headedness.       Objective:   Vitals:   10/18/17 1601  BP: (!) 154/102  Pulse: 91  Resp: 16  Temp: 98.3 F (36.8 C)  SpO2: 97%   BP Readings from Last 3 Encounters:  10/18/17 (!) 154/102  09/28/17 (!) 192/110  08/22/17 (!) 155/93   Wt  Readings from Last 3 Encounters:  10/18/17 287 lb (130.2 kg)  09/28/17 287 lb (130.2 kg)  01/29/17 288 lb (130.6 kg)   Body mass index is 44.95 kg/m.   Physical Exam    Constitutional: Appears well-developed and well-nourished. No distress.  HENT:  Head: Normocephalic and atraumatic.  Neck: Neck supple. No tracheal deviation present. No thyromegaly present.  No cervical lymphadenopathy Cardiovascular: Normal rate, regular rhythm and normal heart sounds.  No murmur heard. No carotid bruit .  No edema Pulmonary/Chest: Effort normal and breath sounds normal. No respiratory distress. No has no wheezes. No rales.  Skin: Skin is warm and dry. Not diaphoretic.  Psychiatric: Normal mood and affect. Behavior is normal.    Diabetic Foot Exam - Simple   Simple Foot Form Diabetic Foot exam was performed with the following findings:  Yes 10/18/2017  4:45 PM  Visual Inspection No deformities, no ulcerations, no other skin breakdown bilaterally:  Yes Sensation Testing Intact to touch and monofilament testing bilaterally:  Yes Pulse Check Posterior Tibialis and Dorsalis pulse intact bilaterally:  Yes Comments      Assessment & Plan:    See Problem List for Assessment and Plan of chronic medical problems.

## 2017-10-16 NOTE — Patient Instructions (Addendum)
  Your a1c was checked today.    Medications reviewed and updated.  Changes include restarting the metformin.  Start hctz 25 mg daily for your BP.   Your prescription(s) have been submitted to your pharmacy. Please take as directed and contact our office if you believe you are having problem(s) with the medication(s).  A referral was ordered for pulmonary for evaluation of your sleep apnea - they will call you to schedule this.    Please followup in 3 months

## 2017-10-18 ENCOUNTER — Encounter: Payer: Self-pay | Admitting: Internal Medicine

## 2017-10-18 ENCOUNTER — Ambulatory Visit (INDEPENDENT_AMBULATORY_CARE_PROVIDER_SITE_OTHER): Payer: 59 | Admitting: Internal Medicine

## 2017-10-18 VITALS — BP 154/102 | HR 91 | Temp 98.3°F | Resp 16 | Wt 287.0 lb

## 2017-10-18 DIAGNOSIS — K2100 Gastro-esophageal reflux disease with esophagitis, without bleeding: Secondary | ICD-10-CM

## 2017-10-18 DIAGNOSIS — E119 Type 2 diabetes mellitus without complications: Secondary | ICD-10-CM | POA: Diagnosis not present

## 2017-10-18 DIAGNOSIS — F17200 Nicotine dependence, unspecified, uncomplicated: Secondary | ICD-10-CM | POA: Diagnosis not present

## 2017-10-18 DIAGNOSIS — I10 Essential (primary) hypertension: Secondary | ICD-10-CM | POA: Diagnosis not present

## 2017-10-18 DIAGNOSIS — R1033 Periumbilical pain: Secondary | ICD-10-CM

## 2017-10-18 DIAGNOSIS — R0683 Snoring: Secondary | ICD-10-CM

## 2017-10-18 LAB — POCT GLYCOSYLATED HEMOGLOBIN (HGB A1C): HEMOGLOBIN A1C: 7.9 % — AB (ref 4.0–5.6)

## 2017-10-18 MED ORDER — HYDROCHLOROTHIAZIDE 25 MG PO TABS: 25.0000 mg | ORAL_TABLET | Freq: Every day | ORAL | 5 refills | Status: DC

## 2017-10-18 MED ORDER — IRBESARTAN 150 MG PO TABS: 150.0000 mg | ORAL_TABLET | Freq: Every day | ORAL | 5 refills | Status: DC

## 2017-10-18 NOTE — Assessment & Plan Note (Signed)
Chronic fatigue, non-refreshing sleep, snoring and witnessed apnea-high probability of obstructive sleep apnea Refer to pulmonary for further evaluation Continue weight loss efforts

## 2017-10-18 NOTE — Assessment & Plan Note (Signed)
Blood pressure not controlled Continue Avapro 150 mg daily Start hydrochlorothiazide 25 mg daily Recent CMP reviewed Continue weight loss efforts Continue low-sodium diet Follow-up in 3 months

## 2017-10-18 NOTE — Assessment & Plan Note (Signed)
She discontinued the metformin for her CT scan when she was in acute abdominal pain and did not restart it-restart now A1c today 7.9% Continue regular exercise, diabetic diet and weight loss efforts We will consider adding a second medication at her next visit if A1c is still elevated

## 2017-10-18 NOTE — Assessment & Plan Note (Signed)
Has decreased the amount that she is smoking and will continue her efforts-we will consider trying nicotine patches Chantix not approved by insurance

## 2017-10-18 NOTE — Assessment & Plan Note (Signed)
GERD controlled Continue daily medication  

## 2017-10-18 NOTE — Assessment & Plan Note (Signed)
Saw surgery and will have umbilical hernia surgery next month Continue tramadol as needed

## 2017-11-04 ENCOUNTER — Encounter: Payer: Self-pay | Admitting: Internal Medicine

## 2017-11-24 ENCOUNTER — Institutional Professional Consult (permissible substitution): Payer: 59 | Admitting: Pulmonary Disease

## 2018-01-12 ENCOUNTER — Institutional Professional Consult (permissible substitution): Payer: 59 | Admitting: Pulmonary Disease

## 2018-01-18 ENCOUNTER — Ambulatory Visit: Payer: 59 | Admitting: Internal Medicine

## 2018-01-18 DIAGNOSIS — Z0289 Encounter for other administrative examinations: Secondary | ICD-10-CM

## 2018-01-18 NOTE — Progress Notes (Deleted)
Subjective:    Patient ID: Krystal Edwards, female    DOB: 26-May-1985, 32 y.o.   MRN: [id number]  HPI The patient is here for follow up.  Hypertension: Hydrochlorothiazide 25 mg a day started 3 months ago.  She is taking her medication daily. She is compliant with a low sodium diet.  She denies chest pain, palpitations, edema, shortness of breath and regular headaches. She is exercising regularly.  She does not monitor her blood pressure at home.    Diabetes: 3 months ago A1c was 7.9%-she was not taking her metformin because it was discontinued after her CT scan and not restarted.  She restarted it 3 months ago area she is taking her medication daily as prescribed. She is compliant with a diabetic diet. She is exercising regularly. She monitors her sugars and they have been running XXX. She checks her feet daily and denies foot lesions. She is up-to-date with an ophthalmology examination.   Tobacco abuse: 3 months ago we discussed her quitting smoking.  She had cut down, but was still smoking.  Chantix was trying to be approved by her insurance company, but she tried nicotine patches  ?  OSA: At her last visit we discussed symptoms she had that were consistent with sleep apnea.  She was referred to pulmonary and had an appointment on 10/23, but did not show.  Medications and allergies reviewed with patient and updated if appropriate.  Patient Active Problem List   Diagnosis Date Noted  . Snores 10/18/2017  . Periumbilical abdominal pain 09/28/2017  . GERD (gastroesophageal reflux disease) 12/28/2016  . PCOS (polycystic ovarian syndrome) 12/28/2016  . Family history of diabetes mellitus 12/28/2016  . Tobacco dependence 12/28/2016  . Essential hypertension 12/28/2016  . DOE (dyspnea on exertion) 12/28/2016  . Diabetes mellitus without complication ([place]) 56/31/4970  . Chronic daily headache 02/12/2016  . Neck pain 08/15/2015  . Occipital neuralgia of right side 08/15/2015  .  Laryngopharyngeal reflux (LPR) 02/26/2014  . Cough 02/26/2014    Current Outpatient Medications on File Prior to Visit  Medication Sig Dispense Refill  . acetaminophen (TYLENOL) 500 MG tablet Take 1,500 mg by mouth every 4 (four) hours as needed for mild pain or moderate pain.    . fluticasone (FLONASE) 50 MCG/ACT nasal spray Place 1 spray into both nostrils daily. 16 g 0  . hydrochlorothiazide (HYDRODIURIL) 25 MG tablet Take 1 tablet (25 mg total) by mouth daily. 30 tablet 5  . ibuprofen (ADVIL,MOTRIN) 800 MG tablet Take 1 tablet (800 mg total) by mouth 3 (three) times daily. 21 tablet 0  . irbesartan (AVAPRO) 150 MG tablet Take 1 tablet (150 mg total) by mouth daily. 30 tablet 5  . metFORMIN (GLUCOPHAGE) 500 MG tablet Take 1 tablet (500 mg total) 2 (two) times daily with a meal by mouth. 60 tablet 5  . omeprazole (PRILOSEC) 40 MG capsule Take 1 capsule (40 mg total) by mouth daily. Take 30 minutes prior to a meal 30 capsule 3  . traMADol (ULTRAM) 50 MG tablet Take 1-2 tablets (50-100 mg total) by mouth every 8 (eight) hours as needed. 30 tablet 0   No current facility-administered medications on file prior to visit.     Past Medical History:  Diagnosis Date  . Carpal tunnel syndrome 03-2011   BILATERAL RECEIVES INJECTIONS  . Choroid plexus cyst of fetus 08/06/2015  . Diabetes mellitus without complication ([place])   . Headache   . Oligo-ovulation   . Pre-eclampsia 12/02/2015  Past Surgical History:  Procedure Laterality Date  . BREAST BIOPSY Left 04/2007   benign  . BREAST SURGERY  2009   left breast bx-benign    Social History   Socioeconomic History  . Marital status: Single    Spouse name: n/a  . Number of children: 0  . Years of education: 8  . Highest education level: Not on file  Occupational History  . Occupation: Armed forces technical officer: [employer]  . Financial resource strain: Not on file  . Food insecurity:    Worry: Not on file     Inability: Not on file  . Transportation needs:    Medical: Not on file    Non-medical: Not on file  Tobacco Use  . Smoking status: Current Every Day Smoker    Packs/day: 0.25    Years: 9.00    Pack years: 2.25    Types: Cigarettes    Last attempt to quit: 01/20/2014    Years since quitting: 3.9  . Smokeless tobacco: Never Used  Substance and Sexual Activity  . Alcohol use: No    Alcohol/week: 0.0 standard drinks  . Drug use: No  . Sexual activity: Yes    Partners: Male    Birth control/protection: None  Lifestyle  . Physical activity:    Days per week: Not on file    Minutes per session: Not on file  . Stress: Not on file  Relationships  . Social connections:    Talks on phone: Not on file    Gets together: Not on file    Attends religious service: Not on file    Active member of club or organization: Not on file    Attends meetings of clubs or organizations: Not on file    Relationship status: Not on file  Other Topics Concern  . Not on file  Social History Narrative   Lives with father and brother. Mother lives with her sister, also in [place].    Family History  Problem Relation Age of Onset  . Breast cancer Mother 74       2005 AND 2013  . Diabetes Father   . Diabetes Maternal Grandfather   . Breast cancer Paternal Grandmother   . Breast cancer Paternal Grandfather     Review of Systems     Objective:  There were no vitals filed for this visit. BP Readings from Last 3 Encounters:  10/18/17 (!) 154/102  09/28/17 (!) 192/110  08/22/17 (!) 155/93   Wt Readings from Last 3 Encounters:  10/18/17 287 lb (130.2 kg)  09/28/17 287 lb (130.2 kg)  01/29/17 288 lb (130.6 kg)   There is no height or weight on file to calculate BMI.   Physical Exam    Constitutional: Appears well-developed and well-nourished. No distress.  HENT:  Head: Normocephalic and atraumatic.  Neck: Neck supple. No tracheal deviation present. No thyromegaly present.  No cervical  lymphadenopathy Cardiovascular: Normal rate, regular rhythm and normal heart sounds.   No murmur heard. No carotid bruit .  No edema Pulmonary/Chest: Effort normal and breath sounds normal. No respiratory distress. No has no wheezes. No rales.  Skin: Skin is warm and dry. Not diaphoretic.  Psychiatric: Normal mood and affect. Behavior is normal.      Assessment & Plan:    See Problem List for Assessment and Plan of chronic medical problems.

## 2018-02-04 ENCOUNTER — Encounter: Payer: Self-pay | Admitting: Pulmonary Disease

## 2018-02-04 ENCOUNTER — Ambulatory Visit (INDEPENDENT_AMBULATORY_CARE_PROVIDER_SITE_OTHER): Payer: Self-pay | Admitting: Pulmonary Disease

## 2018-02-04 DIAGNOSIS — R0683 Snoring: Secondary | ICD-10-CM

## 2018-02-04 NOTE — Patient Instructions (Signed)
High probability of significant sleep disordered breathing Nonrestorative sleep  We will set you up with a home sleep study  Treatment options will include an auto titrating CPAP  Encourage weight loss Encourage elevation of the head of the bed at night to help reflux symptoms Continue to work on weight loss and exercise  Call with any significant concerns

## 2018-02-04 NOTE — Progress Notes (Signed)
Subjective:    Patient ID: Krystal Edwards, female    DOB: 1985-04-02, 32 y.o.   MRN: [id number]  Complaint: Possible obstructive sleep apnea with witnessed apneas and snoring, nonrestorative sleep   Patient with a history of snoring, witnessed apneas Nonrestorative sleep at night Wakes up very frequently at night History of migraines History of significant reflux in the past  She does have dryness of the mouth in the mornings, headaches in the mornings, night sweats, memory loss, decreased focus Usually goes to bed about 10-12, falls asleep in 5 to 10 minutes Wakes about 3-4 times during the night Final awakening time about 6 AM  Still up about 50 pounds since having a baby about couple years ago  Symptoms seem progressive  Review of Systems  Constitutional: Negative for fever and unexpected weight change.  HENT: Negative for congestion, dental problem, ear pain, nosebleeds, postnasal drip, rhinorrhea, sinus pressure, sneezing, sore throat and trouble swallowing.   Eyes: Negative for redness and itching.  Respiratory: Negative for cough, chest tightness, shortness of breath and wheezing.   Cardiovascular: Positive for leg swelling. Negative for palpitations.  Gastrointestinal: Negative for nausea and vomiting.  Genitourinary: Negative for dysuria.  Musculoskeletal: Negative for joint swelling.  Skin: Negative for rash.  Neurological: Negative for headaches.  Hematological: Does not bruise/bleed easily.  Psychiatric/Behavioral: Positive for sleep disturbance. Negative for dysphoric mood. The patient is not nervous/anxious.    Past Medical History:  Diagnosis Date  . Carpal tunnel syndrome 03-2011   BILATERAL RECEIVES INJECTIONS  . Choroid plexus cyst of fetus 08/06/2015  . Diabetes mellitus without complication ([place])   . Headache   . Oligo-ovulation   . Pre-eclampsia 12/02/2015   Social History   Socioeconomic History  . Marital status: Single    Spouse name: n/a  .  Number of children: 0  . Years of education: 52  . Highest education level: Not on file  Occupational History  . Occupation: Armed forces technical officer: [employer]  . Financial resource strain: Not on file  . Food insecurity:    Worry: Not on file    Inability: Not on file  . Transportation needs:    Medical: Not on file    Non-medical: Not on file  Tobacco Use  . Smoking status: Current Every Day Smoker    Packs/day: 0.50    Years: 12.00    Pack years: 6.00    Types: Cigarettes  . Smokeless tobacco: Never Used  Substance and Sexual Activity  . Alcohol use: No    Alcohol/week: 0.0 standard drinks  . Drug use: No  . Sexual activity: Yes    Partners: Male    Birth control/protection: None  Lifestyle  . Physical activity:    Days per week: Not on file    Minutes per session: Not on file  . Stress: Not on file  Relationships  . Social connections:    Talks on phone: Not on file    Gets together: Not on file    Attends religious service: Not on file    Active member of club or organization: Not on file    Attends meetings of clubs or organizations: Not on file    Relationship status: Not on file  . Intimate partner violence:    Fear of current or ex partner: Not on file    Emotionally abused: Not on file    Physically abused: Not on file    Forced sexual  activity: Not on file  Other Topics Concern  . Not on file  Social History Narrative   Lives with father and brother. Mother lives with her sister, also in [place].   Past Medical History:  Diagnosis Date  . Carpal tunnel syndrome 03-2011   BILATERAL RECEIVES INJECTIONS  . Choroid plexus cyst of fetus 08/06/2015  . Diabetes mellitus without complication ([place])   . Headache   . Oligo-ovulation   . Pre-eclampsia 12/02/2015   Vitals:   02/04/18 1029  BP: (!) 140/92  Pulse: 91  SpO2: 96%   Epworth Sleepiness Scale of 9      Objective:   Physical Exam  Constitutional: She is oriented to  person, place, and time. No distress.  Obese  HENT:  Head: Normocephalic and atraumatic.  Mouth/Throat: No oropharyngeal exudate.  Mallampati 3  Eyes: Pupils are equal, round, and reactive to light. Right eye exhibits no discharge. Left eye exhibits no discharge.  Neck: Normal range of motion. Neck supple. No tracheal deviation present. No thyromegaly present.  Cardiovascular: Normal rate and regular rhythm.  No murmur heard. Pulmonary/Chest: Effort normal and breath sounds normal. No stridor. No respiratory distress. She has no wheezes.  Abdominal: Soft. Bowel sounds are normal. She exhibits no distension. There is no tenderness.  Musculoskeletal: Normal range of motion. She exhibits no edema or deformity.  Neurological: She is alert and oriented to person, place, and time. She displays normal reflexes. No cranial nerve deficit.  Skin: Skin is warm. She is not diaphoretic.      Assessment & Plan:  .  High probability of significant sleep disordered breathing  .  Reflux symptoms  .  Nonrestorative sleep  .  Excessive daytime sleepiness  Plan  .  We will schedule patient for a home sleep study .  Importance of exercise and weight loss discussed with the patient .  Sleeping with the head of the bed elevated may help reflux symptoms .  Regular exercise and weight loss encouraged .  Pathophysiology of sleep disordered breathing discussed .  Options of treatment for sleep disordered breathing discussed . Marland Kitchen Encouraged to call with any significant concerns or questions .  We will see patient back in the office in about 3 months

## 2018-10-07 ENCOUNTER — Other Ambulatory Visit: Payer: Self-pay

## 2018-10-07 DIAGNOSIS — Z20822 Contact with and (suspected) exposure to covid-19: Secondary | ICD-10-CM

## 2018-10-13 LAB — NOVEL CORONAVIRUS, NAA: SARS-CoV-2, NAA: NOT DETECTED

## 2019-02-12 ENCOUNTER — Emergency Department (HOSPITAL_COMMUNITY)
Admission: EM | Admit: 2019-02-12 | Discharge: 2019-02-12 | Disposition: A | Discharge status: Home / Self Care | Payer: Self-pay | Attending: Emergency Medicine | Admitting: Emergency Medicine

## 2019-02-12 ENCOUNTER — Other Ambulatory Visit: Payer: Self-pay

## 2019-02-12 ENCOUNTER — Emergency Department (HOSPITAL_COMMUNITY): Payer: Self-pay

## 2019-02-12 ENCOUNTER — Encounter (HOSPITAL_COMMUNITY): Payer: Self-pay | Admitting: Emergency Medicine

## 2019-02-12 DIAGNOSIS — Z79899 Other long term (current) drug therapy: Secondary | ICD-10-CM | POA: Insufficient documentation

## 2019-02-12 DIAGNOSIS — I10 Essential (primary) hypertension: Secondary | ICD-10-CM

## 2019-02-12 DIAGNOSIS — J189 Pneumonia, unspecified organism: Secondary | ICD-10-CM

## 2019-02-12 DIAGNOSIS — Z20828 Contact with and (suspected) exposure to other viral communicable diseases: Secondary | ICD-10-CM | POA: Insufficient documentation

## 2019-02-12 DIAGNOSIS — E119 Type 2 diabetes mellitus without complications: Secondary | ICD-10-CM | POA: Insufficient documentation

## 2019-02-12 DIAGNOSIS — Z20822 Contact with and (suspected) exposure to covid-19: Secondary | ICD-10-CM

## 2019-02-12 DIAGNOSIS — Z7984 Long term (current) use of oral hypoglycemic drugs: Secondary | ICD-10-CM | POA: Insufficient documentation

## 2019-02-12 DIAGNOSIS — F17210 Nicotine dependence, cigarettes, uncomplicated: Secondary | ICD-10-CM | POA: Insufficient documentation

## 2019-02-12 LAB — POC SARS CORONAVIRUS 2 AG -  ED: SARS Coronavirus 2 Ag: NEGATIVE

## 2019-02-12 LAB — INFLUENZA PANEL BY PCR (TYPE A & B)
Influenza A By PCR: NEGATIVE
Influenza B By PCR: NEGATIVE

## 2019-02-12 MED ORDER — FLUTICASONE PROPIONATE 50 MCG/ACT NA SUSP: 1.0000 | Freq: Every day | NASAL | 0 refills | Status: DC

## 2019-02-12 MED ORDER — BENZONATATE 100 MG PO CAPS: 100.0000 mg | ORAL_CAPSULE | Freq: Three times a day (TID) | ORAL | 0 refills | Status: DC | PRN

## 2019-02-12 MED ORDER — IRBESARTAN 150 MG PO TABS: 150.0000 mg | ORAL_TABLET | Freq: Every day | ORAL | 0 refills | Status: DC

## 2019-02-12 MED ORDER — DOXYCYCLINE HYCLATE 100 MG PO CAPS: 100.0000 mg | ORAL_CAPSULE | Freq: Two times a day (BID) | ORAL | 0 refills | Status: DC

## 2019-02-12 MED ORDER — METFORMIN HCL 500 MG PO TABS: 500.0000 mg | ORAL_TABLET | Freq: Two times a day (BID) | ORAL | 0 refills | Status: DC

## 2019-02-12 MED ORDER — HYDROCHLOROTHIAZIDE 25 MG PO TABS: 25.0000 mg | ORAL_TABLET | Freq: Every day | ORAL | 0 refills | Status: DC

## 2019-02-12 MED ORDER — AEROCHAMBER PLUS FLO-VU MEDIUM MISC
1.0000 | Freq: Once | Status: AC
  Administered 2019-02-12: 1
  Filled 2019-02-12: qty 1

## 2019-02-12 MED ORDER — DOXYCYCLINE HYCLATE 100 MG PO TABS
100.0000 mg | ORAL_TABLET | Freq: Once | ORAL | Status: AC
  Administered 2019-02-12: 100 mg via ORAL
  Filled 2019-02-12: qty 1

## 2019-02-12 MED ORDER — ALBUTEROL SULFATE HFA 108 (90 BASE) MCG/ACT IN AERS
2.0000 | INHALATION_SPRAY | RESPIRATORY_TRACT | Status: DC | PRN
  Administered 2019-02-12: 2 via RESPIRATORY_TRACT
  Filled 2019-02-12: qty 6.7

## 2019-02-12 NOTE — ED Triage Notes (Signed)
Pt arriving POV with flu like symptoms. Pt was test for Covid and tested negative on Wednesday. Pt has been treating symptoms at home for the last week with no improvement. Pt reports feeling as if she has to control her breathing because she feels short of breath. No obvious signs of respiratory distress at this time.

## 2019-02-12 NOTE — ED Provider Notes (Addendum)
[facility] DEPT Provider Note   CSN: [id number] Arrival date & time: 02/12/19  0418     History   Chief Complaint Chief Complaint  Patient presents with   Flu like symptoms    HPI Krystal Edwards is a 33 y.o. female with a hx of NIDDM presents to the Emergency Department complaining of gradual, persistent, progressively worsening COVID like illness onset 1 week ago.  Pt reports nasal congestion, loss of taste and smell, cough, headache, nausea. Associated symptoms include myalgias and occasional vomiting after coughing.  Pt taking nyquil and dayquil at home without relief of symptoms. Pt denies fever, neck pain, chest pain.  Pt reports if she takes a deep breathe she has a coughing fit. Pt reports smoking 1 ppd.       The history is provided by the patient and medical records. No language interpreter was used.    Past Medical History:  Diagnosis Date   Carpal tunnel syndrome 03-2011   BILATERAL RECEIVES INJECTIONS   Choroid plexus cyst of fetus 08/06/2015   Diabetes mellitus without complication ([place])    Headache    Oligo-ovulation    Pre-eclampsia 12/02/2015    Patient Active Problem List   Diagnosis Date Noted   Snores 0000000   Periumbilical abdominal pain 09/28/2017   GERD (gastroesophageal reflux disease) 12/28/2016   PCOS (polycystic ovarian syndrome) 12/28/2016   Family history of diabetes mellitus 12/28/2016   Tobacco dependence 12/28/2016   Essential hypertension 12/28/2016   DOE (dyspnea on exertion) 12/28/2016   Diabetes mellitus without complication ([place]) [id number]   Chronic daily headache 02/12/2016   Neck pain 08/15/2015   Occipital neuralgia of right side 08/15/2015   Laryngopharyngeal reflux (LPR) 02/26/2014   Cough 02/26/2014    Past Surgical History:  Procedure Laterality Date   BREAST BIOPSY Left 04/2007   benign   BREAST SURGERY  2009   left breast bx-benign     OB History    Gravida  3   Para  1   Term  1   Preterm      AB  2   Living  1     SAB  2   TAB      Ectopic      Multiple  0   Live Births  1            Home Medications    Prior to Admission medications   Medication Sig Start Date End Date Taking? Authorizing Provider  aspirin-acetaminophen-caffeine (EXCEDRIN EXTRA STRENGTH) [id number] MG tablet Take 2 tablets by mouth every 6 (six) hours as needed for headache or migraine.   Yes [provider]  DM-Doxylamine-Acetaminophen (NYQUIL HBP COLD & FLU) 15-6.25-325 MG/15ML LIQD Take 30 mLs by mouth at bedtime as needed (sleep/cough).   Yes [provider]  Pseudoephedrine-APAP-DM (DAYQUIL MULTI-SYMPTOM COLD/FLU PO) Take 30 mLs by mouth every 6 (six) hours as needed (cough).   Yes [provider]  benzonatate (TESSALON PERLES) 100 MG capsule Take 1 capsule (100 mg total) by mouth 3 (three) times daily as needed for cough (cough). 02/12/19   Hayleen Clinkscales, Jarrett Soho, PA-C  doxycycline (VIBRAMYCIN) 100 MG capsule Take 1 capsule (100 mg total) by mouth 2 (two) times daily. 02/12/19   Jquan Egelston, Jarrett Soho, PA-C  fluticasone (FLONASE) 50 MCG/ACT nasal spray Place 1 spray into both nostrils daily. 02/12/19   Kaitrin Seybold, Jarrett Soho, PA-C  hydrochlorothiazide (HYDRODIURIL) 25 MG tablet Take 1 tablet (25 mg total) by mouth daily. 02/12/19  Malavika Lira, Jarrett Soho, PA-C  ibuprofen (ADVIL,MOTRIN) 800 MG tablet Take 1 tablet (800 mg total) by mouth 3 (three) times daily. Patient not taking: Reported on 02/12/2019 08/22/17   Petrucelli, Aldona Bar R, PA-C  irbesartan (AVAPRO) 150 MG tablet Take 1 tablet (150 mg total) by mouth daily. 02/12/19   Manuela Halbur, Jarrett Soho, PA-C  metFORMIN (GLUCOPHAGE) 500 MG tablet Take 1 tablet (500 mg total) by mouth 2 (two) times daily with a meal. 02/12/19   Shamicka Inga, Jarrett Soho, PA-C  omeprazole (PRILOSEC) 40 MG capsule Take 1 capsule (40 mg total) by mouth daily. Take 30 minutes prior to a meal Patient not  taking: Reported on 02/12/2019 12/28/16   Binnie Rail, MD  traMADol (ULTRAM) 50 MG tablet Take 1-2 tablets (50-100 mg total) by mouth every 8 (eight) hours as needed. Patient not taking: Reported on 02/12/2019 09/29/17   Binnie Rail, MD    Family History Family History  Problem Relation Age of Onset   Breast cancer Mother 30       2005 AND 2013   Diabetes Father    Diabetes Maternal Grandfather    Breast cancer Paternal Grandmother    Breast cancer Paternal Grandfather     Social History Social History   Tobacco Use   Smoking status: Current Every Day Smoker    Packs/day: 0.50    Years: 12.00    Pack years: 6.00    Types: Cigarettes   Smokeless tobacco: Never Used  Substance Use Topics   Alcohol use: No    Alcohol/week: 0.0 standard drinks   Drug use: No     Allergies   Patient has no known allergies.   Review of Systems Review of Systems  Constitutional: Positive for fatigue. Negative for appetite change, diaphoresis, fever and unexpected weight change.  HENT: Negative for mouth sores.   Eyes: Negative for visual disturbance.  Respiratory: Positive for cough, chest tightness and shortness of breath. Negative for wheezing.   Cardiovascular: Negative for chest pain.  Gastrointestinal: Positive for nausea and vomiting. Negative for abdominal pain, constipation and diarrhea.  Endocrine: Negative for polydipsia, polyphagia and polyuria.  Genitourinary: Negative for dysuria, frequency, hematuria and urgency.  Musculoskeletal: Positive for myalgias. Negative for back pain and neck stiffness.  Skin: Negative for rash.  Allergic/Immunologic: Negative for immunocompromised state.  Neurological: Positive for headaches. Negative for syncope and light-headedness.  Hematological: Does not bruise/bleed easily.  Psychiatric/Behavioral: Negative for sleep disturbance. The patient is not nervous/anxious.      Physical Exam Updated Vital Signs BP (!) 164/87 (BP  Location: Right Arm)    Pulse 80    Temp 98.7 F (37.1 C) (Oral)    Resp 15    Ht 5\' 9"  (1.753 m)    Wt 123.8 kg    LMP 02/05/2019    SpO2 98%    BMI 40.32 kg/m   Physical Exam Vitals signs and nursing note reviewed.  Constitutional:      General: She is not in acute distress.    Appearance: She is not diaphoretic.  HENT:     Head: Normocephalic.  Eyes:     General: No scleral icterus.    Conjunctiva/sclera: Conjunctivae normal.  Neck:     Musculoskeletal: Normal range of motion.  Cardiovascular:     Rate and Rhythm: Normal rate and regular rhythm.     Pulses: Normal pulses.          Radial pulses are 2+ on the right side and 2+ on the left side.  Pulmonary:     Effort: No tachypnea, accessory muscle usage, prolonged expiration, respiratory distress or retractions.     Breath sounds: No stridor.     Comments: Equal chest rise. No increased work of breathing. Abdominal:     General: There is no distension.     Palpations: Abdomen is soft.     Tenderness: There is no abdominal tenderness. There is no guarding or rebound.  Musculoskeletal:     Comments: Moves all extremities equally and without difficulty.  Skin:    General: Skin is warm and dry.     Capillary Refill: Capillary refill takes less than 2 seconds.  Neurological:     Mental Status: She is alert.     GCS: GCS eye subscore is 4. GCS verbal subscore is 5. GCS motor subscore is 6.     Comments: Speech is clear and goal oriented.  Psychiatric:        Mood and Affect: Mood normal.      ED Treatments / Results  Labs (all labs ordered are listed, but only abnormal results are displayed) Labs Reviewed  NOVEL CORONAVIRUS, NAA (HOSP ORDER, SEND-OUT TO REF LAB; TAT 18-24 HRS)  INFLUENZA PANEL BY PCR (TYPE A & B)  POC SARS CORONAVIRUS 2 AG -  ED     Radiology Dg Chest Port 1 View  Result Date: 02/12/2019 CLINICAL DATA:  Shortness of breath. Flu-like symptoms. EXAM: PORTABLE CHEST 1 VIEW COMPARISON:  08/22/2017  FINDINGS: Upper normal heart size likely accentuated by technique. Patchy right infrahilar opacity. No pulmonary edema, pleural fluid, or pneumothorax. No acute osseous abnormalities are seen IMPRESSION: Patchy right infrahilar opacity, may be atelectasis or pneumonia in the appropriate clinical setting. Electronically Signed   By: Keith Rake M.D.   On: 02/12/2019 05:43    Procedures Procedures (including critical care time)  Medications Ordered in ED Medications  albuterol (VENTOLIN HFA) 108 (90 Base) MCG/ACT inhaler 2 puff (2 puffs Inhalation Given 02/12/19 0618)  AeroChamber Plus Flo-Vu Medium MISC 1 each (1 each Other Given 02/12/19 0618)  doxycycline (VIBRA-TABS) tablet 100 mg (100 mg Oral Given 02/12/19 [id number])     Initial Impression / Assessment and Plan / ED Course  I have reviewed the triage vital signs and the nursing notes.  Pertinent labs & imaging results that were available during my care of the patient were reviewed by me and considered in my medical decision making (see chart for details).  Clinical Course as of Feb 11 645  Nancy Fetter Feb 12, 2019  [id number] Right patchy infiltrate suspicious for pneumonia.  I personally evaluated these images.  DG Chest Port 1 View [HM]  [id number] Patient hypertensive.  No chest pain.  She has history of same.  BP(!): 179/102 [HM]    Clinical Course User Index [HM] Alize Acy, Jarrett Soho, PA-C        Krystal Edwards was evaluated in Emergency Department on 02/12/2019 for the symptoms described in the history of present illness. She was evaluated in the context of the global COVID-19 pandemic, which necessitated consideration that the patient might be at risk for infection with the SARS-CoV-2 virus that causes COVID-19. Institutional protocols and algorithms that pertain to the evaluation of patients at risk for COVID-19 are in a state of rapid change based on information released by regulatory bodies including the CDC and federal and state  organizations. These policies and algorithms were followed during the patient's care in the ED.  Patient with Covid-like illness.  Vital signs are  within normal limits.  Chest x-ray shows questionable pneumonia.  If Covid is negative we will treat for community-acquired pneumonia.  If Covid is positive we will continue with supportive therapies.  6:46 AM Covid and influenza are negative.  Suspect patient had Covid and now has developed bacterial pneumonia.  Will treat with doxycycline.  Patient given albuterol inhaler.  No hypoxia or tachycardia here in the emergency department.  She is well-appearing.  No respiratory distress.  She ambulates without difficulty.  Will discharge home with symptomatic therapy.  She is to see primary care if symptoms do not improve.  Patient hypertensive here and not currently taking her medications.  Will refill today along with other home meds.  Final Clinical Impressions(s) / ED Diagnoses   Final diagnoses:  Suspected COVID-19 virus infection  Community acquired pneumonia of right lower lobe of lung  Hypertension, unspecified type    ED Discharge Orders         Ordered    fluticasone (FLONASE) 50 MCG/ACT nasal spray  Daily     02/12/19 0644    hydrochlorothiazide (HYDRODIURIL) 25 MG tablet  Daily     02/12/19 0645    metFORMIN (GLUCOPHAGE) 500 MG tablet  2 times daily with meals     02/12/19 0645    irbesartan (AVAPRO) 150 MG tablet  Daily     02/12/19 0645    doxycycline (VIBRAMYCIN) 100 MG capsule  2 times daily     02/12/19 0645    benzonatate (TESSALON PERLES) 100 MG capsule  3 times daily PRN,   Status:  Discontinued     02/12/19 0645    benzonatate (TESSALON PERLES) 100 MG capsule  3 times daily PRN     02/12/19 0645             Porchea Charrier, Jarrett Soho, PA-C 02/12/19 [id number]    Veryl Speak, MD 02/13/19 [phone]

## 2019-02-12 NOTE — Discharge Instructions (Signed)
1. Medications: Alternate tylenol and ibuprofen for fever control, continue usual home medications; Flonase for nasal congestion.  Tessalon Perles for cough.  Doxycycline for pneumonia.  Refills of your hydrochlorothiazide, metformin and irbesartan also sent to the pharmacy. 2. Treatment: rest, drink plenty of fluids, isolate for the next 10 days 3. Follow Up: Please followup with your primary doctor if your symptoms are not improving after 10-14 days; Please return to the ER for high fevers, persistent vomiting, shortness of breath or other concerns.      Person Under Monitoring Name: Krystal Edwards  Location: [address] [place] [id number]   Infection Prevention Recommendations for Individuals Confirmed to have, or Being Evaluated for, 2019 Novel Coronavirus (COVID-19) Infection Who Receive Care at Home  Individuals who are confirmed to have, or are being evaluated for, COVID-19 should follow the prevention steps below until a healthcare provider or local or state health department says they can return to normal activities.  Stay home except to get medical care You should restrict activities outside your home, except for getting medical care. Do not go to work, school, or public areas, and do not use public transportation or taxis.  Call ahead before visiting your doctor Before your medical appointment, call the healthcare provider and tell them that you have, or are being evaluated for, COVID-19 infection. This will help the healthcare providers office take steps to keep other people from getting infected. Ask your healthcare provider to call the local or state health department.  Monitor your symptoms Seek prompt medical attention if your illness is worsening (e.g., difficulty breathing). Before going to your medical appointment, call the healthcare provider and tell them that you have, or are being evaluated for, COVID-19 infection. Ask your healthcare provider to  call the local or state health department.  Wear a facemask You should wear a facemask that covers your nose and mouth when you are in the same room with other people and when you visit a healthcare provider. People who live with or visit you should also wear a facemask while they are in the same room with you.  Separate yourself from other people in your home As much as possible, you should stay in a different room from other people in your home. Also, you should use a separate bathroom, if available.  Avoid sharing household items You should not share dishes, drinking glasses, cups, eating utensils, towels, bedding, or other items with other people in your home. After using these items, you should wash them thoroughly with soap and water.  Cover your coughs and sneezes Cover your mouth and nose with a tissue when you cough or sneeze, or you can cough or sneeze into your sleeve. Throw used tissues in a lined trash can, and immediately wash your hands with soap and water for at least 20 seconds or use an alcohol-based hand rub.  Wash your [facility] your hands often and thoroughly with soap and water for at least 20 seconds. You can use an alcohol-based hand sanitizer if soap and water are not available and if your hands are not visibly dirty. Avoid touching your eyes, nose, and mouth with unwashed hands.   Prevention Steps for Caregivers and Household Members of Individuals Confirmed to have, or Being Evaluated for, COVID-19 Infection Being Cared for in the Home  If you live with, or provide care at home for, a person confirmed to have, or being evaluated for, COVID-19 infection please follow these guidelines to prevent  infection:  Follow healthcare providers instructions Make sure that you understand and can help the patient follow any healthcare provider instructions for all care.  Provide for the patients basic needs You should help the patient with basic needs in the home  and provide support for getting groceries, prescriptions, and other personal needs.  Monitor the patients symptoms If they are getting sicker, call his or her medical provider and tell them that the patient has, or is being evaluated for, COVID-19 infection. This will help the healthcare providers office take steps to keep other people from getting infected. Ask the healthcare provider to call the local or state health department.  Limit the number of people who have contact with the patient If possible, have only one caregiver for the patient. Other household members should stay in another home or place of residence. If this is not possible, they should stay in another room, or be separated from the patient as much as possible. Use a separate bathroom, if available. Restrict visitors who do not have an essential need to be in the home.  Keep older adults, very young children, and other sick people away from the patient Keep older adults, very young children, and those who have compromised immune systems or chronic health conditions away from the patient. This includes people with chronic heart, lung, or kidney conditions, diabetes, and cancer.  Ensure good ventilation Make sure that shared spaces in the home have good air flow, such as from an air conditioner or an opened window, weather permitting.  Wash your hands often Wash your hands often and thoroughly with soap and water for at least 20 seconds. You can use an alcohol based hand sanitizer if soap and water are not available and if your hands are not visibly dirty. Avoid touching your eyes, nose, and mouth with unwashed hands. Use disposable paper towels to dry your hands. If not available, use dedicated cloth towels and replace them when they become wet.  Wear a facemask and gloves Wear a disposable facemask at all times in the room and gloves when you touch or have contact with the patients blood, body fluids, and/or secretions  or excretions, such as sweat, saliva, sputum, nasal mucus, vomit, urine, or feces.  Ensure the mask fits over your nose and mouth tightly, and do not touch it during use. Throw out disposable facemasks and gloves after using them. Do not reuse. Wash your hands immediately after removing your facemask and gloves. If your personal clothing becomes contaminated, carefully remove clothing and launder. Wash your hands after handling contaminated clothing. Place all used disposable facemasks, gloves, and other waste in a lined container before disposing them with other household waste. Remove gloves and wash your hands immediately after handling these items.  Do not share dishes, glasses, or other household items with the patient Avoid sharing household items. You should not share dishes, drinking glasses, cups, eating utensils, towels, bedding, or other items with a patient who is confirmed to have, or being evaluated for, COVID-19 infection. After the person uses these items, you should wash them thoroughly with soap and water.  Wash laundry thoroughly Immediately remove and wash clothes or bedding that have blood, body fluids, and/or secretions or excretions, such as sweat, saliva, sputum, nasal mucus, vomit, urine, or feces, on them. Wear gloves when handling laundry from the patient. Read and follow directions on labels of laundry or clothing items and detergent. In general, wash and dry with the warmest temperatures recommended on the  label.  Clean all areas the individual has used often Clean all touchable surfaces, such as counters, tabletops, doorknobs, bathroom fixtures, toilets, phones, keyboards, tablets, and bedside tables, every day. Also, clean any surfaces that may have blood, body fluids, and/or secretions or excretions on them. Wear gloves when cleaning surfaces the patient has come in contact with. Use a diluted bleach solution (e.g., dilute bleach with 1 part bleach and 10 parts  water) or a household disinfectant with a label that says EPA-registered for coronaviruses. To make a bleach solution at home, add 1 tablespoon of bleach to 1 quart (4 cups) of water. For a larger supply, add  cup of bleach to 1 gallon (16 cups) of water. Read labels of cleaning products and follow recommendations provided on product labels. Labels contain instructions for safe and effective use of the cleaning product including precautions you should take when applying the product, such as wearing gloves or eye protection and making sure you have good ventilation during use of the product. Remove gloves and wash hands immediately after cleaning.  Monitor yourself for signs and symptoms of illness Caregivers and household members are considered close contacts, should monitor their health, and will be asked to limit movement outside of the home to the extent possible. Follow the monitoring steps for close contacts listed on the symptom monitoring form.   ? If you have additional questions, contact your local health department or call the epidemiologist on call at [phone] (available 24/7). ? This guidance is subject to change. For the most up-to-date guidance from [facility], please refer to their website: [employer]B[facility]pl

## 2019-02-13 LAB — NOVEL CORONAVIRUS, NAA (HOSP ORDER, SEND-OUT TO REF LAB; TAT 18-24 HRS): SARS-CoV-2, NAA: NOT DETECTED

## 2019-07-25 ENCOUNTER — Other Ambulatory Visit: Payer: Self-pay | Admitting: Internal Medicine

## 2019-08-20 ENCOUNTER — Other Ambulatory Visit: Payer: Self-pay

## 2019-08-20 ENCOUNTER — Emergency Department (HOSPITAL_COMMUNITY): Payer: Self-pay

## 2019-08-20 ENCOUNTER — Encounter (HOSPITAL_COMMUNITY): Payer: Self-pay | Admitting: Emergency Medicine

## 2019-08-20 ENCOUNTER — Emergency Department (HOSPITAL_COMMUNITY)
Admission: EM | Admit: 2019-08-20 | Discharge: 2019-08-20 | Disposition: A | Discharge status: Home / Self Care | Payer: Self-pay | Attending: Emergency Medicine | Admitting: Emergency Medicine

## 2019-08-20 ENCOUNTER — Other Ambulatory Visit (HOSPITAL_COMMUNITY): Payer: Self-pay

## 2019-08-20 ENCOUNTER — Encounter (HOSPITAL_COMMUNITY): Payer: Self-pay

## 2019-08-20 ENCOUNTER — Ambulatory Visit (HOSPITAL_COMMUNITY)
Admission: EM | Admit: 2019-08-20 | Discharge: 2019-08-20 | Disposition: A | Discharge status: Home / Self Care | Payer: Self-pay

## 2019-08-20 DIAGNOSIS — R1084 Generalized abdominal pain: Secondary | ICD-10-CM

## 2019-08-20 DIAGNOSIS — R109 Unspecified abdominal pain: Secondary | ICD-10-CM

## 2019-08-20 DIAGNOSIS — R102 Pelvic and perineal pain: Secondary | ICD-10-CM

## 2019-08-20 DIAGNOSIS — R1011 Right upper quadrant pain: Secondary | ICD-10-CM | POA: Insufficient documentation

## 2019-08-20 DIAGNOSIS — R112 Nausea with vomiting, unspecified: Secondary | ICD-10-CM | POA: Insufficient documentation

## 2019-08-20 DIAGNOSIS — R10813 Right lower quadrant abdominal tenderness: Secondary | ICD-10-CM | POA: Insufficient documentation

## 2019-08-20 DIAGNOSIS — Z79899 Other long term (current) drug therapy: Secondary | ICD-10-CM | POA: Insufficient documentation

## 2019-08-20 DIAGNOSIS — R197 Diarrhea, unspecified: Secondary | ICD-10-CM

## 2019-08-20 DIAGNOSIS — Z7984 Long term (current) use of oral hypoglycemic drugs: Secondary | ICD-10-CM | POA: Insufficient documentation

## 2019-08-20 DIAGNOSIS — N83201 Unspecified ovarian cyst, right side: Secondary | ICD-10-CM | POA: Insufficient documentation

## 2019-08-20 DIAGNOSIS — F17210 Nicotine dependence, cigarettes, uncomplicated: Secondary | ICD-10-CM | POA: Insufficient documentation

## 2019-08-20 DIAGNOSIS — E119 Type 2 diabetes mellitus without complications: Secondary | ICD-10-CM | POA: Insufficient documentation

## 2019-08-20 DIAGNOSIS — I10 Essential (primary) hypertension: Secondary | ICD-10-CM | POA: Insufficient documentation

## 2019-08-20 LAB — COMPREHENSIVE METABOLIC PANEL
ALT: 93 U/L — ABNORMAL HIGH (ref 0–44)
AST: 80 U/L — ABNORMAL HIGH (ref 15–41)
Albumin: 3.6 g/dL (ref 3.5–5.0)
Alkaline Phosphatase: 50 U/L (ref 38–126)
Anion gap: 11 (ref 5–15)
BUN: 9 mg/dL (ref 6–20)
CO2: 22 mmol/L (ref 22–32)
Calcium: 8.6 mg/dL — ABNORMAL LOW (ref 8.9–10.3)
Chloride: 101 mmol/L (ref 98–111)
Creatinine, Ser: 0.7 mg/dL (ref 0.44–1.00)
GFR calc Af Amer: >60 mL/min (ref >60)
GFR calc non Af Amer: >60 mL/min (ref >60)
Glucose, Bld: 206 mg/dL — ABNORMAL HIGH (ref 70–99)
Potassium: 3.8 mmol/L (ref 3.5–5.1)
Sodium: 134 mmol/L — ABNORMAL LOW (ref 135–145)
Total Bilirubin: 0.8 mg/dL (ref 0.3–1.2)
Total Protein: 7.7 g/dL (ref 6.5–8.1)

## 2019-08-20 LAB — URINALYSIS, ROUTINE W REFLEX MICROSCOPIC
Bilirubin Urine: NEGATIVE
Glucose, UA: NEGATIVE mg/dL
Hgb urine dipstick: NEGATIVE
Ketones, ur: NEGATIVE mg/dL
Leukocytes,Ua: NEGATIVE
Nitrite: NEGATIVE
Protein, ur: 100 mg/dL — AB
Specific Gravity, Urine: 1.023 (ref 1.005–1.030)
pH: 5 (ref 5.0–8.0)

## 2019-08-20 LAB — CBC
HCT: 40.6 % (ref 36.0–46.0)
Hemoglobin: 12.9 g/dL (ref 12.0–15.0)
MCH: 26.2 pg (ref 26.0–34.0)
MCHC: 31.8 g/dL (ref 30.0–36.0)
MCV: 82.5 fL (ref 80.0–100.0)
Platelets: 315 10*3/uL (ref 150–400)
RBC: 4.92 MIL/uL (ref 3.87–5.11)
RDW: 12.9 % (ref 11.5–15.5)
WBC: 8.5 10*3/uL (ref 4.0–10.5)
nRBC: 0 % (ref 0.0–0.2)

## 2019-08-20 LAB — I-STAT BETA HCG BLOOD, ED (MC, WL, AP ONLY): I-stat hCG, quantitative: <5 m[IU]/mL (ref <5)

## 2019-08-20 LAB — LIPASE, BLOOD: Lipase: 26 U/L (ref 11–51)

## 2019-08-20 MED ORDER — IOHEXOL 300 MG/ML  SOLN
100.0000 mL | Freq: Once | INTRAMUSCULAR | Status: AC | PRN
  Administered 2019-08-20: 100 mL via INTRAVENOUS

## 2019-08-20 MED ORDER — HYDROMORPHONE HCL 1 MG/ML IJ SOLN
1.0000 mg | Freq: Once | INTRAMUSCULAR | Status: AC
  Administered 2019-08-20: 1 mg via INTRAVENOUS
  Filled 2019-08-20: qty 1

## 2019-08-20 MED ORDER — ONDANSETRON HCL 4 MG/2ML IJ SOLN
4.0000 mg | Freq: Once | INTRAMUSCULAR | Status: AC
  Administered 2019-08-20: 4 mg via INTRAVENOUS
  Filled 2019-08-20: qty 2

## 2019-08-20 MED ORDER — SODIUM CHLORIDE 0.9% FLUSH
3.0000 mL | Freq: Once | INTRAVENOUS | Status: AC
  Administered 2019-08-20: 3 mL via INTRAVENOUS

## 2019-08-20 MED ORDER — KETOROLAC TROMETHAMINE 15 MG/ML IJ SOLN
15.0000 mg | Freq: Once | INTRAMUSCULAR | Status: AC
  Administered 2019-08-20: 15 mg via INTRAVENOUS
  Filled 2019-08-20: qty 1

## 2019-08-20 MED ORDER — HYDROCODONE-ACETAMINOPHEN 5-325 MG PO TABS: 1.0000 | ORAL_TABLET | ORAL | 0 refills | Status: DC | PRN

## 2019-08-20 MED ORDER — SODIUM CHLORIDE 0.9 % IV BOLUS
1000.0000 mL | Freq: Once | INTRAVENOUS | Status: AC
  Administered 2019-08-20: 1000 mL via INTRAVENOUS

## 2019-08-20 NOTE — ED Notes (Signed)
Not in the room

## 2019-08-20 NOTE — ED Notes (Signed)
This RN transported pt to [place]

## 2019-08-20 NOTE — ED Triage Notes (Signed)
C/o mid abd pain since Thursday with nausea, vomiting, and diarrhea.  Denies urinary complaints.

## 2019-08-20 NOTE — ED Notes (Signed)
The pt reports that she needs to eat   She is diabetic.  The edp  Is looking for results from radiologist  None yet  He reports that  The pt can eat  Meal given with drink

## 2019-08-20 NOTE — ED Notes (Signed)
Pt transported to CT ?

## 2019-08-20 NOTE — Discharge Instructions (Addendum)
Go to the ER for further evaluation and treatment. 

## 2019-08-20 NOTE — ED Notes (Signed)
Patient requesting IV be removed. States she wants to go and wants something to eat. IV removed and provider and RN aware of patients requests.

## 2019-08-20 NOTE — ED Provider Notes (Signed)
[facility] EMERGENCY DEPARTMENT Provider Note   CSN: [id number] Arrival date & time: 08/20/19  1058     History Chief Complaint  Patient presents with  . Abdominal Pain    Krystal Edwards is a 34 y.o. female.  HPI 34 year old female with abdominal pain.  Onset about 3 days ago.  Pain is in the right abdomen, worse in the right upper quadrant.  Pain waxes and wanes but not completely go away.  She has not noticed any appreciable exacerbating leaving factors.  She will occasionally get much sharper pain without any rhyme or reason.  Subjective fevers.  Nauseated.  She states that she cannot keep anything down.  No change in her bowel movements.  No urinary complaints.  Surgical history significant for umbilical hernia repair.  No unusual vaginal bleeding or discharge.    Past Medical History:  Diagnosis Date  . Carpal tunnel syndrome 03-2011   BILATERAL RECEIVES INJECTIONS  . Choroid plexus cyst of fetus 08/06/2015  . Diabetes mellitus without complication ([place])   . Headache   . Oligo-ovulation   . Pre-eclampsia 12/02/2015    Patient Active Problem List   Diagnosis Date Noted  . Snores 10/18/2017  . Periumbilical abdominal pain 09/28/2017  . GERD (gastroesophageal reflux disease) 12/28/2016  . PCOS (polycystic ovarian syndrome) 12/28/2016  . Family history of diabetes mellitus 12/28/2016  . Tobacco dependence 12/28/2016  . Essential hypertension 12/28/2016  . DOE (dyspnea on exertion) 12/28/2016  . Diabetes mellitus without complication ([place]) [id number]  . Chronic daily headache 02/12/2016  . Neck pain 08/15/2015  . Occipital neuralgia of right side 08/15/2015  . Laryngopharyngeal reflux (LPR) 02/26/2014  . Cough 02/26/2014    Past Surgical History:  Procedure Laterality Date  . BREAST BIOPSY Left 04/2007   benign  . BREAST SURGERY  2009   left breast bx-benign  . HERNIA REPAIR       OB History    Gravida  3   Para  1   Term  1   Preterm       AB  2   Living  1     SAB  2   TAB      Ectopic      Multiple  0   Live Births  1           Family History  Problem Relation Age of Onset  . Breast cancer Mother 76       2005 AND 2013  . Diabetes Father   . Diabetes Maternal Grandfather   . Breast cancer Paternal Grandmother   . Breast cancer Paternal Grandfather     Social History   Tobacco Use  . Smoking status: Current Every Day Smoker    Packs/day: 0.50    Years: 12.00    Pack years: 6.00    Types: Cigarettes  . Smokeless tobacco: Never Used  Substance Use Topics  . Alcohol use: No    Alcohol/week: 0.0 standard drinks  . Drug use: No    Home Medications Prior to Admission medications   Medication Sig Start Date End Date Taking? Authorizing Provider  aspirin-acetaminophen-caffeine (EXCEDRIN EXTRA STRENGTH) [phone] MG tablet Take 2 tablets by mouth every 6 (six) hours as needed for headache or migraine.    [provider]  benzonatate (TESSALON PERLES) 100 MG capsule Take 1 capsule (100 mg total) by mouth 3 (three) times daily as needed for cough (cough). 02/12/19   Muthersbaugh, Jarrett Soho, PA-C  DM-Doxylamine-Acetaminophen (NYQUIL  HBP COLD & FLU) 15-6.25-325 MG/15ML LIQD Take 30 mLs by mouth at bedtime as needed (sleep/cough).    [provider]  doxycycline (VIBRAMYCIN) 100 MG capsule Take 1 capsule (100 mg total) by mouth 2 (two) times daily. 02/12/19   Muthersbaugh, Jarrett Soho, PA-C  fluticasone (FLONASE) 50 MCG/ACT nasal spray Place 1 spray into both nostrils daily. 02/12/19   Muthersbaugh, Jarrett Soho, PA-C  hydrochlorothiazide (HYDRODIURIL) 25 MG tablet Take 1 tablet (25 mg total) by mouth daily. 02/12/19   Muthersbaugh, Jarrett Soho, PA-C  ibuprofen (ADVIL,MOTRIN) 800 MG tablet Take 1 tablet (800 mg total) by mouth 3 (three) times daily. Patient not taking: Reported on 02/12/2019 08/22/17   Petrucelli, Aldona Bar R, PA-C  irbesartan (AVAPRO) 150 MG tablet Take 1 tablet (150 mg total) by mouth  daily. 02/12/19   Muthersbaugh, Jarrett Soho, PA-C  metFORMIN (GLUCOPHAGE) 500 MG tablet Take 1 tablet (500 mg total) by mouth 2 (two) times daily with a meal. 02/12/19   Muthersbaugh, Jarrett Soho, PA-C  omeprazole (PRILOSEC) 40 MG capsule Take 1 capsule (40 mg total) by mouth daily. Take 30 minutes prior to a meal Patient not taking: Reported on 02/12/2019 12/28/16   Binnie Rail, MD  Pseudoephedrine-APAP-DM (DAYQUIL MULTI-SYMPTOM COLD/FLU PO) Take 30 mLs by mouth every 6 (six) hours as needed (cough).    [provider]    Allergies    Patient has no known allergies.  Review of Systems   Review of Systems All systems reviewed and negative, other than as noted in HPI.  Physical Exam Updated Vital Signs BP 137/86   Pulse 91   Temp 98.5 F (36.9 C) (Oral)   Resp 20   Ht 5\' 8"  (1.727 m)   Wt 123 kg   LMP 07/21/2019   SpO2 97%   BMI 41.23 kg/m   Physical Exam Vitals and nursing note reviewed.  Constitutional:      General: She is not in acute distress.    Appearance: She is well-developed.  HENT:     Head: Normocephalic and atraumatic.  Eyes:     General:        Right eye: No discharge.        Left eye: No discharge.     Conjunctiva/sclera: Conjunctivae normal.  Cardiovascular:     Rate and Rhythm: Normal rate and regular rhythm.     Heart sounds: Normal heart sounds. No murmur. No friction rub. No gallop.   Pulmonary:     Effort: Pulmonary effort is normal. No respiratory distress.     Breath sounds: Normal breath sounds.  Abdominal:     General: There is no distension.     Palpations: Abdomen is soft.     Tenderness: There is abdominal tenderness.     Comments: Right abdominal tenderness, worse in the right upper quadrant.  No rebound or guarding.  Musculoskeletal:        General: No tenderness.     Cervical back: Neck supple.  Skin:    General: Skin is warm and dry.  Neurological:     Mental Status: She is alert.  Psychiatric:        Behavior: Behavior  normal.        Thought Content: Thought content normal.     ED Results / Procedures / Treatments   Labs (all labs ordered are listed, but only abnormal results are displayed) Labs Reviewed  COMPREHENSIVE METABOLIC PANEL - Abnormal; Notable for the following components:      Result Value   Sodium 134 (*)  Glucose, Bld 206 (*)    Calcium 8.6 (*)    AST 80 (*)    ALT 93 (*)    All other components within normal limits  URINALYSIS, ROUTINE W REFLEX MICROSCOPIC - Abnormal; Notable for the following components:   APPearance HAZY (*)    Protein, ur 100 (*)    Bacteria, UA RARE (*)    All other components within normal limits  LIPASE, BLOOD  CBC  I-STAT BETA HCG BLOOD, ED ([facility], [facility], [facility] ONLY)    EKG None  Radiology CT ABDOMEN PELVIS W CONTRAST  Result Date: 08/20/2019 CLINICAL DATA:  Lower abdominal pain with vomiting EXAM: CT ABDOMEN AND PELVIS WITH CONTRAST TECHNIQUE: Multidetector CT imaging of the abdomen and pelvis was performed using the standard protocol following bolus administration of intravenous contrast. CONTRAST:  164mL OMNIPAQUE IOHEXOL 300 MG/ML  SOLN COMPARISON:  September 28, 2017 FINDINGS: Lower chest: There is slight bibasilar atelectasis. No lung base edema or consolidation. Hepatobiliary: Liver measures 21.8 cm in length. There is diffuse hepatic steatosis. No focal liver lesions are evident. Gallbladder wall is not appreciably thickened. There is no biliary duct dilatation. Pancreas: There is no pancreatic mass or inflammatory focus. Spleen: No splenic lesions are evident. Adrenals/Urinary Tract: Adrenals bilaterally appear normal. Kidneys bilaterally show no evident mass or hydronephrosis. No evident renal or ureteral calculus on either side. The urinary bladder is midline with wall thickness within normal limits. Stomach/Bowel: There is no appreciable bowel wall or mesenteric thickening. No evident bowel obstruction. The terminal ileum appears normal. There is no free air or  portal venous air. Vascular/Lymphatic: No abdominal aortic aneurysm. No arterial vascular abnormality evident. Major venous structures appear patent. There is no demonstrable adenopathy in the abdomen or pelvis. Reproductive: Uterus is anteverted. There is a cystic area in the right ovary measuring 3.0 x 3.1 cm, potentially a dominant follicle. No other pelvic mass. Other: The appendix appears normal. No abscess or ascites is evident in the abdomen or pelvis. Musculoskeletal: No blastic or lytic bone lesions. No intramuscular or abdominal wall lesions are evident. Field IMPRESSION: 1. Right ovarian cyst measuring 3.0 x 3.1 cm. Question dominant follicle. No other pelvic mass. Note that a mass of this size potentially could place right ovary at increased risk for torsion. Given right lower quadrant pain, it may be prudent to consider correlation with pelvic ultrasound including Doppler assessment. 2. Appendix appears normal. No bowel obstruction. No abscess in the abdomen or pelvis. 3.  Prominent liver with hepatic steatosis. 4. No evident renal or ureteral calculus. No hydronephrosis. Urinary bladder wall thickness normal. Electronically Signed   By: Lowella Grip III M.D.   On: 08/20/2019 12:58    Procedures Procedures (including critical care time)  Medications Ordered in ED Medications  sodium chloride flush (NS) 0.9 % injection 3 mL (3 mLs Intravenous Given 08/20/19 1213)  sodium chloride 0.9 % bolus 1,000 mL (1,000 mLs Intravenous New Bag/Given 08/20/19 1217)  HYDROmorphone (DILAUDID) injection 1 mg (1 mg Intravenous Given 08/20/19 1215)  ondansetron (ZOFRAN) injection 4 mg (4 mg Intravenous Given 08/20/19 1213)  ketorolac (TORADOL) 15 MG/ML injection 15 mg (15 mg Intravenous Given 08/20/19 1213)  iohexol (OMNIPAQUE) 300 MG/ML solution 100 mL (100 mLs Intravenous Contrast Given 08/20/19 1228)    ED Course  I have reviewed the triage vital signs and the nursing notes.  Pertinent labs & imaging  results that were available during my care of the patient were reviewed by me and considered in my  medical decision making (see chart for details).    MDM Rules/Calculators/A&P                      34 year old female with right-sided abdominal pain which began about 3 days ago.  She is tender and both her right upper and lower quadrants although more so on the right upper quadrant.  We will start with a CT the abdomen pelvis given ability to evaluate for more pathology, specifically appendicitis.  If this does not show any obvious pathology that would explain her symptoms then we will proceed to ultrasound.  Labs including LFTs and lipase.  UA although my initial impression is that this is not something urologic.  We will treat her symptoms.  Reassessment.  [place] with R ovarian cyst. Possibly etiology of her pain although pain seems higher than I would expect with this. [place] w/o evidence of torsion. PRN pain meds. Outpt FU.   Final Clinical Impression(s) / ED Diagnoses Final diagnoses:  Abdominal pain  Abdominal pain, unspecified abdominal location  Cyst of right ovary    Rx / DC Orders ED Discharge Orders    None       Virgel Manifold, MD 08/22/19 1702

## 2019-08-20 NOTE — ED Triage Notes (Signed)
Pt presents with c/o mid abdominal pain that began on Thursday. Pt states she has also had vomiting and diarrhea

## 2019-08-20 NOTE — ED Provider Notes (Signed)
[facility]   [id number] 08/20/19 Arrival Time: 1004  CC: ABDOMINAL PAIN  SUBJECTIVE:  Krystal Edwards is a 34 y.o. female who presents with complaint of abdominal discomfort that began abruptly 3 days ago. She also reports fever, headache, and body aches. Denies a precipitating event, trauma, close contacts with similar symptoms, recent travel or antibiotic use. Localizes pain to RUQ. Describes as persistent and worsening, sharp in character. Has taken tylenol with fever relief. Cannot tolerate solids or keep down liquids. Denies similar symptoms in the past. Last BM today.    Denies fever, chills, appetite changes, weight changes, chest pain, SOB, constipation, hematochezia, melena, dysuria, difficulty urinating, increased frequency or urgency, flank pain, loss of bowel or bladder function, vaginal discharge, vaginal odor, vaginal bleeding, dyspareunia, pelvic pain.     Patient's last menstrual period was 07/20/2019.  ROS: As per HPI.  All other pertinent ROS negative.     Past Medical History:  Diagnosis Date  . Carpal tunnel syndrome 03-2011   BILATERAL RECEIVES INJECTIONS  . Choroid plexus cyst of fetus 08/06/2015  . Diabetes mellitus without complication ([place])   . Headache   . Oligo-ovulation   . Pre-eclampsia 12/02/2015   Past Surgical History:  Procedure Laterality Date  . BREAST BIOPSY Left 04/2007   benign  . BREAST SURGERY  2009   left breast bx-benign  . HERNIA REPAIR     No Known Allergies No current facility-administered medications on file prior to encounter.   Current Outpatient Medications on File Prior to Encounter  Medication Sig Dispense Refill  . aspirin-acetaminophen-caffeine (EXCEDRIN EXTRA STRENGTH) 250-250-65 MG tablet Take 2 tablets by mouth every 6 (six) hours as needed for headache or migraine.    . benzonatate (TESSALON PERLES) 100 MG capsule Take 1 capsule (100 mg total) by mouth 3 (three) times daily as needed for cough (cough). 20  capsule 0  . DM-Doxylamine-Acetaminophen (NYQUIL HBP COLD & FLU) 15-6.25-325 MG/15ML LIQD Take 30 mLs by mouth at bedtime as needed (sleep/cough).    . doxycycline (VIBRAMYCIN) 100 MG capsule Take 1 capsule (100 mg total) by mouth 2 (two) times daily. 20 capsule 0  . fluticasone (FLONASE) 50 MCG/ACT nasal spray Place 1 spray into both nostrils daily. 16 g 0  . hydrochlorothiazide (HYDRODIURIL) 25 MG tablet Take 1 tablet (25 mg total) by mouth daily. 30 tablet 0  . ibuprofen (ADVIL,MOTRIN) 800 MG tablet Take 1 tablet (800 mg total) by mouth 3 (three) times daily. (Patient not taking: Reported on 02/12/2019) 21 tablet 0  . irbesartan (AVAPRO) 150 MG tablet Take 1 tablet (150 mg total) by mouth daily. 30 tablet 0  . metFORMIN (GLUCOPHAGE) 500 MG tablet Take 1 tablet (500 mg total) by mouth 2 (two) times daily with a meal. 60 tablet 0  . omeprazole (PRILOSEC) 40 MG capsule Take 1 capsule (40 mg total) by mouth daily. Take 30 minutes prior to a meal (Patient not taking: Reported on 02/12/2019) 30 capsule 3  . Pseudoephedrine-APAP-DM (DAYQUIL MULTI-SYMPTOM COLD/FLU PO) Take 30 mLs by mouth every 6 (six) hours as needed (cough).     Social History   Socioeconomic History  . Marital status: Single    Spouse name: n/a  . Number of children: 0  . Years of education: 26  . Highest education level: Not on file  Occupational History  . Occupation: Therapist, art rep    Employer: [employer]  Tobacco Use  . Smoking status: Current Every Day Smoker    Packs/day: 0.50  Years: 12.00    Pack years: 6.00    Types: Cigarettes  . Smokeless tobacco: Never Used  Substance and Sexual Activity  . Alcohol use: No    Alcohol/week: 0.0 standard drinks  . Drug use: No  . Sexual activity: Yes    Partners: Male    Birth control/protection: None  Other Topics Concern  . Not on file  Social History Narrative   Lives with father and brother. Mother lives with her sister, also in [place].   Social Determinants  of Health   Financial Resource Strain:   . Difficulty of Paying Living Expenses:   Food Insecurity:   . Worried About Charity fundraiser in the Last Year:   . Arboriculturist in the Last Year:   Transportation Needs:   . Film/video editor (Medical):   Marland Kitchen Lack of Transportation (Non-Medical):   Physical Activity:   . Days of Exercise per Week:   . Minutes of Exercise per Session:   Stress:   . Feeling of Stress :   Social Connections:   . Frequency of Communication with Friends and Family:   . Frequency of Social Gatherings with Friends and Family:   . Attends Religious Services:   . Active Member of Clubs or Organizations:   . Attends Archivist Meetings:   Marland Kitchen Marital Status:   Intimate Partner Violence:   . Fear of Current or Ex-Partner:   . Emotionally Abused:   Marland Kitchen Physically Abused:   . Sexually Abused:    Family History  Problem Relation Age of Onset  . Breast cancer Mother 42       2005 AND 2013  . Diabetes Father   . Diabetes Maternal Grandfather   . Breast cancer Paternal Grandmother   . Breast cancer Paternal Grandfather      OBJECTIVE:  Vitals:   08/20/19 1031  BP: (!) 148/82  Pulse: 95  Resp: 20  Temp: 97.7 F (36.5 C)  SpO2: 100%    General appearance: Alert; NAD HEENT: NCAT.  Oropharynx clear.  Lungs: clear to auscultation bilaterally without adventitious breath sounds Heart: regular rate and rhythm.  Radial pulses 2+ symmetrical bilaterally Abdomen: soft, non-distended; normal active bowel sounds; tender to light and deep palpation; tender at McBurney's point; positive Murphy's sign; negative rebound; no guarding Back: no CVA tenderness Extremities: no edema; symmetrical with no gross deformities Skin: warm and dry Neurologic: normal gait Psychological: alert and cooperative; normal mood and affect  LABS: No results found for this or any previous visit (from the past 24 hour(s)).  DIAGNOSTIC STUDIES: No results found.    ASSESSMENT & PLAN:  1. Nausea vomiting and diarrhea   2. Generalized abdominal pain     No orders of the defined types were placed in this encounter.    Abd pain: Positive murphy's sign with fever, also very tender over entire right side of abdomen Tearful in the office May be gallbladder vs appendix May need CT and possible surgical intervention If you experience new or worsening symptoms return or go to ER such as fever, chills, nausea, vomiting, diarrhea, bloody or dark tarry stools, constipation, urinary symptoms, worsening abdominal discomfort, symptoms that do not improve with medications, inability to keep fluids down.  Advised patient to go to the ER for further evaluation and treatment.    Faustino Congress, NP 08/20/19 1053

## 2019-08-20 NOTE — Discharge Instructions (Signed)
You have a 3 cm cyst on your R ovary. This may be causing your pain although the pain with ovarian cysts is typically felt lower. Regardless, this should end up resolving by itself. Take prescribed pain medicine as needed. Follow-up with your PCP or OB/GYN.

## 2019-10-02 ENCOUNTER — Ambulatory Visit: Payer: Self-pay | Admitting: Internal Medicine

## 2019-11-13 ENCOUNTER — Ambulatory Visit: Payer: Self-pay | Admitting: Internal Medicine

## 2019-12-22 ENCOUNTER — Encounter: Payer: Self-pay | Admitting: Internal Medicine

## 2019-12-22 ENCOUNTER — Ambulatory Visit: Payer: Self-pay | Admitting: Internal Medicine

## 2019-12-22 VITALS — BP 150/90 | HR 82 | Resp 13 | Ht 68.0 in | Wt 271.5 lb

## 2019-12-22 DIAGNOSIS — F17200 Nicotine dependence, unspecified, uncomplicated: Secondary | ICD-10-CM

## 2019-12-22 DIAGNOSIS — E119 Type 2 diabetes mellitus without complications: Secondary | ICD-10-CM

## 2019-12-22 DIAGNOSIS — I10 Essential (primary) hypertension: Secondary | ICD-10-CM

## 2019-12-22 DIAGNOSIS — R0683 Snoring: Secondary | ICD-10-CM

## 2019-12-22 MED ORDER — AGAMATRIX PRESTO W/DEVICE KIT: PACK | 0 refills | Status: AC

## 2019-12-22 MED ORDER — AGAMATRIX PRESTO TEST VI STRP: ORAL_STRIP | 11 refills | Status: AC

## 2019-12-22 MED ORDER — AGAMATRIX ULTRA-THIN LANCETS MISC: 11 refills | Status: AC

## 2019-12-22 MED ORDER — VARENICLINE TARTRATE 1 MG PO TABS: ORAL_TABLET | ORAL | 1 refills | Status: DC

## 2019-12-22 MED ORDER — CHANTIX STARTING MONTH PAK 0.5 MG X 11 & 1 MG X 42 PO TABS: ORAL_TABLET | ORAL | 0 refills | Status: DC

## 2019-12-22 MED ORDER — LISINOPRIL 10 MG PO TABS: 10.0000 mg | ORAL_TABLET | Freq: Every day | ORAL | 11 refills | Status: DC

## 2019-12-22 MED ORDER — METFORMIN HCL ER 500 MG PO TB24: ORAL_TABLET | ORAL | 11 refills | Status: DC

## 2019-12-22 NOTE — Progress Notes (Signed)
Social worker met with new patient who is scheduled with Dr. Amil Amen for medical visit. Social worker completed New Patient Questionnaire which included completion of housing, intimate partner violence, transportation needs, stress, Emergency planning/management officer strain, food insecurity and screeners. Social History   Socioeconomic History   Marital status: Single    Spouse name: n/a   Number of children: 0   Years of education: 14   Highest education level: Not on file  Occupational History   Occupation: customer service rep    Employer: [employer]  Tobacco Use   Smoking status: Current Every Day Smoker    Packs/day: 0.50    Years: 12.00    Pack years: 6.00    Types: Cigarettes   Smokeless tobacco: Never Used  Substance and Sexual Activity   Alcohol use: No    Alcohol/week: 0.0 standard drinks   Drug use: No   Sexual activity: Not Currently    Partners: Male    Birth control/protection: None  Other Topics Concern   Not on file  Social History Narrative   Lives with father and brother. Mother lives with her sister, also in [place].   Social Determinants of Health   Financial Resource Strain: Low Risk    Difficulty of Paying Living Expenses: Not very hard  Food Insecurity: No Food Insecurity   Worried About Charity fundraiser in the Last Year: Never true   Ran Out of Food in the Last Year: Never true  Transportation Needs: No Transportation Needs   Lack of Transportation (Medical): No   Lack of Transportation (Non-Medical): No  Physical Activity:    Days of Exercise per Week: Not on file   Minutes of Exercise per Session: Not on file  Stress: No Stress Concern Present   Feeling of Stress : Only a little  Social Connections: Moderately Isolated   Frequency of Communication with Friends and Family: More than three times a week   Frequency of Social Gatherings with Friends and Family: Once a week   Attends Religious Services: More than 4 times per year   Active  Member of [employer] or Organizations: No   Attends Archivist Meetings: Never   Marital Status: Never married    Depression screen [facility] 2/9 12/22/2019 12/28/2016  Decreased Interest 0 0  Down, Depressed, Hopeless 0 0  PHQ - 2 Score 0 0  Altered sleeping 3 -  Tired, decreased energy 3 -  Change in appetite 0 -  Feeling bad or failure about yourself  0 -  Trouble concentrating 0 -  Moving slowly or fidgety/restless 0 -  Suicidal thoughts 0 -  PHQ-9 Score 6 -  Difficult doing work/chores Not difficult at all -    GAD 7 : Generalized Anxiety Score 12/22/2019  Nervous, Anxious, on Edge 0  Control/stop worrying 0  Worry too much - different things 2  Trouble relaxing 0  Restless 0  Easily annoyed or irritable 0  Afraid - awful might happen 0  Total GAD 7 Score 2    Patient shared worrying about the pandemic as it is just her and her daughter. Patient reports in the last year had utilities concern and November applied, [employer] program is helping out. Patient reports she received food stamps so this is assisting with food. LCSW informed of counseling/case management services provided within clinic. LCSW provided patient business card if were to be needed.

## 2019-12-22 NOTE — Progress Notes (Signed)
    Subjective:    Patient ID: Krystal Edwards, female   DOB: 1986-01-20, 34 y.o.   MRN: [id number]   HPI   Here to establish  Has been off meds since birth of daughter, who is now 17 yo.    1.  DM:  Diagnosed in 2018.  Was taking Metformin 500 mg twice daily.  Felt fatigued with med.  Was never on extended release.  Diagnosed when pregnant and preeclamptic.    2.  Hypertension:  First noted with preeclampsia 4 years ago.  Was prescribed Avapro and HCTZ in past, but she states she has never taken.    3.  Snoring:  Has never had a sleep study.  Insurance lost when tried to get set up before. Her aunt had a CPAP machine and she might be able to get hold of it.   No outpatient medications have been marked as taking for the 12/22/19 encounter (Office Visit) with Mack Hook, MD.   No Known Allergies  Past Medical History:  Diagnosis Date   Carpal tunnel syndrome 03-2011   BILATERAL RECEIVES INJECTIONS   Choroid plexus cyst of fetus 08/06/2015   Diabetes mellitus without complication ([place])    Headache    Oligo-ovulation    Pre-eclampsia 12/02/2015    Past Surgical History:  Procedure Laterality Date   BREAST BIOPSY Left 04/2007   benign   BREAST SURGERY  2009   left breast bx-benign   HERNIA REPAIR     Family History  Problem Relation Age of Onset   Breast cancer Mother 30       2005 AND 2013   Diabetes Father    Diabetes Maternal Grandfather    Breast cancer Paternal Grandmother    Breast cancer Paternal Grandfather      Review of Systems    Objective:   BP (!) 150/90 (BP Location: Right Arm, Patient Position: Sitting, Cuff Size: Large)   Pulse 82   Resp 13   Ht 5\' 8"  (1.727 m)   Wt 271 lb 8 oz (123.2 kg)   BMI 41.28 kg/m   Physical Exam NAD, obese HEENT:  PERRL, EOMI, Discs sharp bilaterally.  TMs pearly gray, throat without injection.  Hirsute in beard area. Neck:  Supple, No adenopathy, no thyromegaly.  Acanthosis nigricans about neck. Chest:   CTA CV:  RRR with normal S1 and S2, No S3, S4 or murmur.  No carotid brutis.  Carotid, radial and DP/PT pulses normal and equal Abd:  S, NT, No HSM or mass, + BS LE: No edema.   Obese, hirsute in beard area.  Acanthosis nigricans at neck Assessment & Plan   DM:  Fasting labs including A1C, CMP, urine microalbumin/crea, FLP in 3 days.  Willing to try Metformin XR 500 mg twice daily to see if better tolerated.  To take with meals. Glucose monitoring equipment to [facility].  2.  Obese with snoring:  Financial assistance--to apply for May ED visit and notify us if obtains.  We can then proceed with sleep study through [facility] at lower cost.    3.  Hypertension:  Lisinopril 10 mg daily.    4.  Tobacco abuse:  after discussion of options, she would like to utilize [facility] for tobacco cessation support.  Went over directions.  Follow up  in 3 months.

## 2019-12-25 ENCOUNTER — Other Ambulatory Visit (INDEPENDENT_AMBULATORY_CARE_PROVIDER_SITE_OTHER): Payer: Self-pay

## 2019-12-25 DIAGNOSIS — E119 Type 2 diabetes mellitus without complications: Secondary | ICD-10-CM

## 2019-12-25 DIAGNOSIS — I10 Essential (primary) hypertension: Secondary | ICD-10-CM

## 2019-12-26 LAB — HEPATIC FUNCTION PANEL
ALT: 65 IU/L — ABNORMAL HIGH (ref 0–32)
AST: 40 IU/L (ref 0–40)
Albumin: 4.3 g/dL (ref 3.8–4.8)
Alkaline Phosphatase: 55 IU/L (ref 44–121)
Bilirubin Total: 0.4 mg/dL (ref 0.0–1.2)
Bilirubin, Direct: 0.12 mg/dL (ref 0.00–0.40)
Total Protein: 7.2 g/dL (ref 6.0–8.5)

## 2019-12-26 LAB — HEMOGLOBIN A1C
Est. average glucose Bld gHb Est-mCnc: 192 mg/dL
Hgb A1c MFr Bld: 8.3 % — ABNORMAL HIGH (ref 4.8–5.6)

## 2019-12-26 LAB — LIPID PANEL W/O CHOL/HDL RATIO
Cholesterol, Total: 186 mg/dL (ref 100–199)
HDL: 39 mg/dL — ABNORMAL LOW (ref >39)
LDL Chol Calc (NIH): 121 mg/dL — ABNORMAL HIGH (ref 0–99)
Triglycerides: 145 mg/dL (ref 0–149)
VLDL Cholesterol Cal: 26 mg/dL (ref 5–40)

## 2020-01-13 ENCOUNTER — Ambulatory Visit
Admission: RE | Admit: 2020-01-13 | Discharge: 2020-01-13 | Disposition: A | Discharge status: Home / Self Care | Payer: Self-pay | Source: Ambulatory Visit | Attending: Internal Medicine | Admitting: Internal Medicine

## 2020-01-13 ENCOUNTER — Other Ambulatory Visit: Payer: Self-pay

## 2020-01-13 ENCOUNTER — Other Ambulatory Visit: Payer: Self-pay | Admitting: Internal Medicine

## 2020-01-13 DIAGNOSIS — Z1231 Encounter for screening mammogram for malignant neoplasm of breast: Secondary | ICD-10-CM

## 2020-01-18 ENCOUNTER — Other Ambulatory Visit: Payer: Self-pay | Admitting: Internal Medicine

## 2020-01-18 DIAGNOSIS — R928 Other abnormal and inconclusive findings on diagnostic imaging of breast: Secondary | ICD-10-CM

## 2020-02-09 ENCOUNTER — Telehealth: Payer: Self-pay | Admitting: Internal Medicine

## 2020-02-09 ENCOUNTER — Other Ambulatory Visit: Payer: Self-pay

## 2020-02-09 ENCOUNTER — Ambulatory Visit: Payer: Self-pay | Admitting: Internal Medicine

## 2020-02-09 DIAGNOSIS — E119 Type 2 diabetes mellitus without complications: Secondary | ICD-10-CM

## 2020-02-09 DIAGNOSIS — I10 Essential (primary) hypertension: Secondary | ICD-10-CM

## 2020-02-09 MED ORDER — LISINOPRIL 10 MG PO TABS: 10.0000 mg | ORAL_TABLET | Freq: Every day | ORAL | 11 refills | Status: DC

## 2020-02-09 MED ORDER — METFORMIN HCL ER 500 MG PO TB24: ORAL_TABLET | ORAL | 11 refills | Status: DC

## 2020-02-09 MED FILL — LISINOPRIL 10 MG TABS: 10 | 30 days supply | Qty: 30 | Fill #0

## 2020-02-09 MED FILL — metFORMIN HCL ER 500 MG TB2: 500 | 30 days supply | Qty: 60 | Fill #0

## 2020-02-09 NOTE — Telephone Encounter (Signed)
Rx done sent to [facility] per patient request

## 2020-02-19 ENCOUNTER — Other Ambulatory Visit: Payer: Self-pay | Admitting: *Deleted

## 2020-02-19 DIAGNOSIS — N6489 Other specified disorders of breast: Secondary | ICD-10-CM

## 2020-03-26 ENCOUNTER — Ambulatory Visit: Payer: Self-pay | Admitting: Internal Medicine

## 2020-04-02 ENCOUNTER — Other Ambulatory Visit: Payer: Self-pay

## 2020-04-02 ENCOUNTER — Ambulatory Visit
Admission: RE | Admit: 2020-04-02 | Discharge: 2020-04-02 | Disposition: A | Discharge status: Home / Self Care | Payer: No Typology Code available for payment source | Source: Ambulatory Visit | Attending: Obstetrics and Gynecology | Admitting: Obstetrics and Gynecology

## 2020-04-02 ENCOUNTER — Other Ambulatory Visit: Payer: Self-pay | Admitting: Internal Medicine

## 2020-04-02 ENCOUNTER — Ambulatory Visit: Payer: No Typology Code available for payment source | Admitting: *Deleted

## 2020-04-02 ENCOUNTER — Ambulatory Visit: Payer: Self-pay

## 2020-04-02 VITALS — BP 138/102 | Wt 277.5 lb

## 2020-04-02 DIAGNOSIS — N6489 Other specified disorders of breast: Secondary | ICD-10-CM

## 2020-04-02 DIAGNOSIS — Z01419 Encounter for gynecological examination (general) (routine) without abnormal findings: Secondary | ICD-10-CM

## 2020-04-02 MED FILL — LISINOPRIL 10 MG TABS: 10 | 30 days supply | Qty: 30 | Fill #1

## 2020-04-02 NOTE — Progress Notes (Signed)
Ms. SURIYAH VERGARA is a 35 y.o. [phone] female who presents to [facility] clinic today with complaint of left outer breast pain x 2 weeks that comes and goes. Patient rates the pain at a 5 out of 10 and states it is worse when wearing her bra. Patient had a screening mammogram at the [facility] mobile unit completed 01/17/2020 that additional imaging in bilateral breasts is  Recommended for follow-up.    Pap Smear: Pap smear completed today. Last Pap smear was 01/14/2016 at [facility] and was normal. Per patient has no history of an abnormal Pap smear. Last Pap smear result is available in Epic.   Physical exam: Breasts Right breast slightly larger than left breast that per patient has no noticed any changes. No skin abnormalities right breast. Observed a healed boil left outer breast. No nipple retraction bilateral breasts. No nipple discharge bilateral breasts. No lymphadenopathy. No lumps palpated bilateral breasts. No complaints of pain or tenderness on exam.       Pelvic/Bimanual Ext Genitalia No lesions, no swelling and no discharge observed on external genitalia.        Vagina Vagina pink and normal texture. No lesions or discharge observed in vagina.        Cervix Cervix is present. Cervix pink and of normal texture. No discharge observed.    Uterus Uterus is present and palpable. Uterus in normal position and normal size.        Adnexae Bilateral ovaries present and palpable. No tenderness on palpation.         Rectovaginal No rectal exam completed today since patient had no rectal complaints. No skin abnormalities observed on exam.     Smoking History: Patient is a current smoker. Discussed smoking cessation with patient. Referred to the [facility] Quitline and gave resources to the free smoking cessation classes at [facility].   Patient Navigation: Patient education provided. Access to services provided for patient through [facility]CP program.    Breast  and Cervical Cancer Risk Assessment: Patient has family history of her mother, paternal grandmother, and paternal grandfather having breast cancer. Patient has no known genetic mutations or history of radiation treatment to the chest before age 17. Patient does not have history of cervical dysplasia, immunocompromised, or DES exposure in-utero.  Risk Assessment    Risk Scores      04/02/2020   Last edited by: Royston Bake, CMA   5-year risk: 0.5 %   Lifetime risk: 12.1 %          A: [facility] exam with pap smear Complaint of left outer breast pain.  P: Referred patient to the [facility] for a bilateral diagnostic mammogram per  recommendation. Appointment scheduled Tuesday, April 02, 2020 at 1220.  Loletta Parish, RN 04/02/2020 9:33 AM

## 2020-04-02 NOTE — Patient Instructions (Addendum)
Explained breast self awareness with [name]. Pap smear completed today. Let her know [facility] will cover Pap smears and HPV typing every 5 years unless has a history of abnormal Pap smears. Referred patient to the [facility] for a bilateral diagnostic mammogram per recommendation. Appointment scheduled Tuesday, April 02, 2020 at 1220. Patient aware of appointment and will be there. Let patient know will follow up with her within the next couple weeks with results of her Pap smear by letter or phone. Discussed smoking cessation with patient. Referred to the [facility] Quitline and gave resources to the free smoking cessation classes at [facility]. Krystal Edwards verbalized understanding.  Brannock, Arvil Chaco, RN 9:33 AM

## 2020-04-04 LAB — CYTOLOGY - PAP
Adequacy: ABSENT
Comment: NEGATIVE
Diagnosis: NEGATIVE
High risk HPV: NEGATIVE

## 2020-04-05 ENCOUNTER — Telehealth: Payer: Self-pay

## 2020-04-05 NOTE — Telephone Encounter (Signed)
Patient informed negative Pap/HPV-results, next pap smear due in 5 years. Patient verbalized understanding.  

## 2020-04-06 NOTE — Telephone Encounter (Signed)
Please call patient and find out why she is trying to renew meds she should already have completed (Chantix)--was for a 12 week course starting beginning of October. We did not discuss Omeprazole during her visit as well

## 2020-04-09 NOTE — Telephone Encounter (Signed)
Called patient and left a message asking to call back.

## 2020-04-11 NOTE — Telephone Encounter (Signed)
Called patient and she stated that she never got the medicine from the pharmacy, because it took several times to pick it up. When patient tried to pick up her Rx, the pharmacy informed her that her Rx was expired and she needed to request a new prescription from her physician. Patient wants her prescription to be send to [place], [place]

## 2020-04-18 NOTE — Telephone Encounter (Signed)
Patient was made aware of new Rx.

## 2021-01-09 ENCOUNTER — Other Ambulatory Visit: Payer: Self-pay

## 2021-01-09 ENCOUNTER — Emergency Department (HOSPITAL_COMMUNITY): Payer: No Typology Code available for payment source

## 2021-01-09 ENCOUNTER — Emergency Department (HOSPITAL_COMMUNITY)
Admission: EM | Admit: 2021-01-09 | Discharge: 2021-01-09 | Disposition: A | Discharge status: Home / Self Care | Payer: No Typology Code available for payment source | Attending: Student | Admitting: Student

## 2021-01-09 DIAGNOSIS — Z5321 Procedure and treatment not carried out due to patient leaving prior to being seen by health care provider: Secondary | ICD-10-CM | POA: Insufficient documentation

## 2021-01-09 DIAGNOSIS — R55 Syncope and collapse: Secondary | ICD-10-CM | POA: Diagnosis present

## 2021-01-09 LAB — BASIC METABOLIC PANEL
Anion gap: 9 (ref 5–15)
BUN: 8 mg/dL (ref 6–20)
CO2: 25 mmol/L (ref 22–32)
Calcium: 9.6 mg/dL (ref 8.9–10.3)
Chloride: 103 mmol/L (ref 98–111)
Creatinine, Ser: 0.73 mg/dL (ref 0.44–1.00)
GFR, Estimated: >60 mL/min (ref >60)
Glucose, Bld: 174 mg/dL — ABNORMAL HIGH (ref 70–99)
Potassium: 3.6 mmol/L (ref 3.5–5.1)
Sodium: 137 mmol/L (ref 135–145)

## 2021-01-09 LAB — I-STAT BETA HCG BLOOD, ED (MC, WL, AP ONLY): I-stat hCG, quantitative: <5 m[IU]/mL (ref <5)

## 2021-01-09 LAB — CBC WITH DIFFERENTIAL/PLATELET
Abs Immature Granulocytes: 0.04 10*3/uL (ref 0.00–0.07)
Basophils Absolute: 0.1 10*3/uL (ref 0.0–0.1)
Basophils Relative: 1 %
Eosinophils Absolute: 0.3 10*3/uL (ref 0.0–0.5)
Eosinophils Relative: 3 %
HCT: 41 % (ref 36.0–46.0)
Hemoglobin: 13.3 g/dL (ref 12.0–15.0)
Immature Granulocytes: 0 %
Lymphocytes Relative: 27 %
Lymphs Abs: 3.4 10*3/uL (ref 0.7–4.0)
MCH: 28 pg (ref 26.0–34.0)
MCHC: 32.4 g/dL (ref 30.0–36.0)
MCV: 86.3 fL (ref 80.0–100.0)
Monocytes Absolute: 0.6 10*3/uL (ref 0.1–1.0)
Monocytes Relative: 5 %
Neutro Abs: 8 10*3/uL — ABNORMAL HIGH (ref 1.7–7.7)
Neutrophils Relative %: 64 %
Platelets: 300 10*3/uL (ref 150–400)
RBC: 4.75 MIL/uL (ref 3.87–5.11)
RDW: 12.4 % (ref 11.5–15.5)
WBC: 12.4 10*3/uL — ABNORMAL HIGH (ref 4.0–10.5)
nRBC: 0 % (ref 0.0–0.2)

## 2021-01-09 NOTE — ED Provider Notes (Signed)
Emergency Medicine Provider Triage Evaluation Note  Krystal Edwards , a 35 y.o. female  was evaluated in triage.  Pt complains of syncopal episode.  This happened acutely today, before if she felt dizzy as if the world was spinning and also lightheaded.  Denies any preceding chest pain or shortness of breath.  She had 1 similar episode 2 days ago for which she was not evaluated..  Review of Systems  Positive: Syncope, dizziness Negative: Chest pain  Physical Exam  Ht 5\' 7"  (1.702 m)   Wt 117.9 kg   SpO2 99%   BMI 40.72 kg/m  Gen:   Awake, no distress   Resp:  Normal effort  MSK:   Moves extremities without difficulty  Other:  Cranial nerves III through XII grossly intact, no nystagmus  Medical Decision Making  Medically screening exam initiated at 8:54 PM.  Appropriate orders placed.  Krystal Edwards was informed that the remainder of the evaluation will be completed by another provider, this initial triage assessment does not replace that evaluation, and the importance of remaining in the ED until their evaluation is complete.  Syncope work-up   Sherrill Raring, PA-C 01/09/21 2055    Lorelle Gibbs, DO 01/09/21 2354

## 2021-01-09 NOTE — ED Triage Notes (Signed)
Pt brought in by EMS for syncope, headache and dizziness. Pt reports she passed out while at home. Pt states her headache is gone at this time.

## 2021-04-10 ENCOUNTER — Other Ambulatory Visit: Payer: Self-pay | Admitting: Internal Medicine

## 2021-04-10 DIAGNOSIS — E119 Type 2 diabetes mellitus without complications: Secondary | ICD-10-CM

## 2021-04-25 ENCOUNTER — Other Ambulatory Visit: Payer: Self-pay | Admitting: Internal Medicine

## 2021-04-25 DIAGNOSIS — E119 Type 2 diabetes mellitus without complications: Secondary | ICD-10-CM

## 2021-08-11 ENCOUNTER — Other Ambulatory Visit: Payer: Self-pay | Admitting: Family Medicine

## 2021-08-11 DIAGNOSIS — Z1231 Encounter for screening mammogram for malignant neoplasm of breast: Secondary | ICD-10-CM

## 2021-08-25 ENCOUNTER — Ambulatory Visit
Admission: RE | Admit: 2021-08-25 | Discharge: 2021-08-25 | Disposition: A | Discharge status: Home / Self Care | Payer: No Typology Code available for payment source | Source: Ambulatory Visit | Attending: Family Medicine | Admitting: Family Medicine

## 2021-08-25 DIAGNOSIS — Z1231 Encounter for screening mammogram for malignant neoplasm of breast: Secondary | ICD-10-CM

## 2021-08-28 ENCOUNTER — Other Ambulatory Visit: Payer: Self-pay | Admitting: Family Medicine

## 2021-08-28 DIAGNOSIS — R928 Other abnormal and inconclusive findings on diagnostic imaging of breast: Secondary | ICD-10-CM

## 2021-09-04 ENCOUNTER — Other Ambulatory Visit: Payer: Self-pay | Admitting: Family Medicine

## 2021-09-04 ENCOUNTER — Ambulatory Visit
Admission: RE | Admit: 2021-09-04 | Discharge: 2021-09-04 | Disposition: A | Discharge status: Home / Self Care | Payer: No Typology Code available for payment source | Source: Ambulatory Visit | Attending: Family Medicine | Admitting: Family Medicine

## 2021-09-04 DIAGNOSIS — R928 Other abnormal and inconclusive findings on diagnostic imaging of breast: Secondary | ICD-10-CM

## 2021-09-04 DIAGNOSIS — N6320 Unspecified lump in the left breast, unspecified quadrant: Secondary | ICD-10-CM

## 2022-02-24 ENCOUNTER — Ambulatory Visit (HOSPITAL_COMMUNITY): Payer: No Typology Code available for payment source

## 2022-03-02 ENCOUNTER — Ambulatory Visit
Admission: RE | Admit: 2022-03-02 | Discharge: 2022-03-02 | Disposition: A | Discharge status: Home / Self Care | Payer: No Typology Code available for payment source | Source: Ambulatory Visit | Attending: Family Medicine | Admitting: Family Medicine

## 2022-03-02 DIAGNOSIS — N6320 Unspecified lump in the left breast, unspecified quadrant: Secondary | ICD-10-CM

## 2022-03-02 HISTORY — PX: BREAST BIOPSY: SHX20

## 2022-03-04 ENCOUNTER — Encounter (HOSPITAL_COMMUNITY): Payer: Self-pay

## 2022-03-04 ENCOUNTER — Ambulatory Visit (HOSPITAL_COMMUNITY)
Admission: EM | Admit: 2022-03-04 | Discharge: 2022-03-04 | Disposition: A | Discharge status: Home / Self Care | Payer: No Typology Code available for payment source | Attending: Emergency Medicine | Admitting: Emergency Medicine

## 2022-03-04 DIAGNOSIS — Z20822 Contact with and (suspected) exposure to covid-19: Secondary | ICD-10-CM | POA: Diagnosis not present

## 2022-03-04 DIAGNOSIS — J069 Acute upper respiratory infection, unspecified: Secondary | ICD-10-CM | POA: Diagnosis not present

## 2022-03-04 LAB — RESP PANEL BY RT-PCR (FLU A&B, COVID) ARPGX2
Influenza A by PCR: NEGATIVE
Influenza B by PCR: NEGATIVE
SARS Coronavirus 2 by RT PCR: NEGATIVE

## 2022-03-04 NOTE — Discharge Instructions (Addendum)
Rest,push fluids, may use OTC meds for symptom management(Coricidin HBP,Afrin, chloraseptic throat lozenges, etc). Follow up with PCP. Check my chart for results, quarantine until results known.

## 2022-03-04 NOTE — ED Triage Notes (Signed)
Pt is here for sore throat, cough, body aches, chills, fatigue, nasal drainage, nasal congestion, loss of appetite and fever x 1day

## 2022-03-04 NOTE — ED Provider Notes (Signed)
[facility]    CSN: [id number] Arrival date & time: 03/04/22  1210      History   Chief Complaint Chief Complaint  Patient presents with   Cough   Abdominal Pain   Otalgia   Sore Throat   Shortness of Breath   Fever    HPI Krystal Edwards is a 36 y.o. female.   36 year old female presenting to urgent care with chief complaint of sudden onset of nasal congestion, fever, cough,body aches,sore throat, loss of appetite since last night. Pt took Coricidin HBP and sudafed  for symptom management. Pt reports daughter has similar signs and symptoms. Pt has not received flu shot this year or updated covid booster.   PMH:  Diabetes, HTN, has been taking meds regularly.   The history is provided by the patient. No language interpreter was used.    Past Medical History:  Diagnosis Date   Carpal tunnel syndrome 03-2011   BILATERAL RECEIVES INJECTIONS   Choroid plexus cyst of fetus 08/06/2015   Diabetes mellitus without complication ([place])    Headache    Oligo-ovulation    Pre-eclampsia 12/02/2015    Patient Active Problem List   Diagnosis Date Noted   Viral URI with cough 03/04/2022   Encounter for laboratory testing for COVID-19 virus 03/04/2022   Snores 44/31/5400   Periumbilical abdominal pain 09/28/2017   GERD (gastroesophageal reflux disease) 12/28/2016   PCOS (polycystic ovarian syndrome) 12/28/2016   Family history of diabetes mellitus 12/28/2016   Tobacco dependence 12/28/2016   Essential hypertension 12/28/2016   DOE (dyspnea on exertion) 12/28/2016   Diabetes mellitus without complication ([place]) 86/76/1952   Chronic daily headache 02/12/2016   Neck pain 08/15/2015   Occipital neuralgia of right side 08/15/2015   Laryngopharyngeal reflux (LPR) 02/26/2014   Cough 02/26/2014    Past Surgical History:  Procedure Laterality Date   BREAST BIOPSY Left 04/2007   benign   BREAST BIOPSY Left 03/02/2022   [place] LT BREAST BX W LOC DEV 1ST LESION IMG BX SPEC US  GUIDE 03/02/2022 [facility] MAMMOGRAPHY   BREAST SURGERY  2009   left breast bx-benign   HERNIA REPAIR      OB History     Gravida  3   Para  1   Term  1   Preterm      AB  2   Living  1      SAB  2   IAB      Ectopic      Multiple  0   Live Births  1            Home Medications    Prior to Admission medications   Medication Sig Start Date End Date Taking? Authorizing Provider  AgaMatrix Ultra-Thin Lancets MISC Check blood glucose twice daily before meals 12/22/19   Mack Hook, MD  aspirin-acetaminophen-caffeine (EXCEDRIN EXTRA STRENGTH) [phone] MG tablet Take 2 tablets by mouth every 6 (six) hours as needed for headache or migraine.    [provider]  Blood Glucose Monitoring Suppl (AGAMATRIX PRESTO) w/Device KIT Check blood glucose twice daily before meals 12/22/19   Mack Hook, MD  fluticasone [facility]) 50 MCG/ACT nasal spray Place 1 spray into both nostrils daily. Patient not taking: No sig reported 02/12/19   Muthersbaugh, Jarrett Soho, PA-C  glucose blood (AGAMATRIX PRESTO TEST) test strip Check blood glucose twice daily before meals 12/22/19   Mack Hook, MD  hydrochlorothiazide (HYDRODIURIL) 25 MG tablet Take 1 tablet (25 mg total)  by mouth daily. Patient not taking: No sig reported 02/12/19   Muthersbaugh, Jarrett Soho, PA-C  lisinopril (ZESTRIL) 10 MG tablet Take 1 tablet (10 mg total) by mouth daily. Patient not taking: Reported on 04/02/2020 02/09/20   Mack Hook, MD  metFORMIN (GLUCOPHAGE-XR) 500 MG 24 hr tablet TAKE 1 TABLET BY MOUTH TWO TIMES DAILY WITH MEALS 04/11/21   Mack Hook, MD  omeprazole (PRILOSEC) 40 MG capsule TAKE 1 CAPSULE BY MOUTH DAILY. TAKE 30 MINUTES PRIOR TO A MEAL 04/17/20   Mack Hook, MD  varenicline (CHANTIX STARTING MONTH PAK) 0.5 MG X 11 & 1 MG X 42 tablet TAKE 1 TABLET (0.5MG) BY MOUTH DAILY FOR 3 DAYS,THEN INCREASE TO 1 TABLET (0.5MG) TWICE DAILY FOR 4 DAYS, THEN INCREASE TO 1  TABLET (1MG) TWICE DAILY 04/17/20   Mack Hook, MD  varenicline (CHANTIX) 1 MG tablet 1 tab by mouth twice daily to start following completion of starter pack Patient not taking: Reported on 04/02/2020 12/22/19   Mack Hook, MD    Family History Family History  Problem Relation Age of Onset   Breast cancer Mother 30       2005 AND 2013   Diabetes Father    Diabetes Maternal Grandfather    Breast cancer Paternal Grandmother    Breast cancer Paternal Grandfather     Social History Social History   Tobacco Use   Smoking status: Every Day    Packs/day: 0.50    Years: 12.00    Total pack years: 6.00    Types: Cigarettes   Smokeless tobacco: Never  Vaping Use   Vaping Use: Never used  Substance Use Topics   Alcohol use: No    Alcohol/week: 0.0 standard drinks of alcohol   Drug use: No     Allergies   Patient has no known allergies.   Review of Systems Review of Systems  Constitutional:  Positive for appetite change, chills, fatigue and fever.  HENT:  Positive for congestion and sore throat.   Respiratory:  Positive for cough. Negative for shortness of breath.   Cardiovascular:  Negative for chest pain and palpitations.  Musculoskeletal:  Positive for myalgias.  All other systems reviewed and are negative.    Physical Exam Triage Vital Signs ED Triage Vitals  Enc Vitals Group     BP 03/04/22 1425 (!) 158/97     Pulse Rate 03/04/22 1425 (!) 110     Resp 03/04/22 1425 16     Temp 03/04/22 1425 98.1 F (36.7 C)     Temp Source 03/04/22 1425 Oral     SpO2 03/04/22 1425 96 %     Weight --      Height --      Head Circumference --      Peak Flow --      Pain Score 03/04/22 1423 9     Pain Loc --      Pain Edu? --      Excl. in [place]? --    No data found.  Updated Vital Signs BP (!) 158/97 (BP Location: Right Arm)   Pulse (!) 110   Temp 98.1 F (36.7 C) (Oral)   Resp 16   LMP 02/11/2022   SpO2 96%   Visual Acuity Right Eye Distance:    Left Eye Distance:   Bilateral Distance:    Right Eye Near:   Left Eye Near:    Bilateral Near:     Physical Exam Vitals and nursing note reviewed.  Constitutional:  General: She is not in acute distress.    Appearance: She is well-developed and well-groomed.  HENT:     Head: Normocephalic.     Right Ear: There is impacted cerumen.     Left Ear: Tympanic membrane is retracted.     Nose: Congestion present.     Mouth/Throat:     Mouth: Mucous membranes are moist.     Pharynx: Oropharynx is clear.  Eyes:     General: Lids are normal.     Conjunctiva/sclera: Conjunctivae normal.     Pupils: Pupils are equal, round, and reactive to light.  Neck:     Trachea: Trachea normal. No tracheal deviation.  Cardiovascular:     Rate and Rhythm: Regular rhythm. Tachycardia present.     Pulses: Normal pulses.     Heart sounds: Normal heart sounds. No murmur heard. Pulmonary:     Effort: Pulmonary effort is normal.     Breath sounds: Normal breath sounds and air entry.  Abdominal:     General: Bowel sounds are normal.     Palpations: Abdomen is soft.     Tenderness: There is no abdominal tenderness.  Musculoskeletal:        General: Normal range of motion.     Cervical back: Normal range of motion.  Lymphadenopathy:     Cervical: No cervical adenopathy.  Skin:    General: Skin is warm and dry.     Findings: No rash.  Neurological:     General: No focal deficit present.     Mental Status: She is alert and oriented to person, place, and time.     GCS: GCS eye subscore is 4. GCS verbal subscore is 5. GCS motor subscore is 6.  Psychiatric:        Attention and Perception: Attention normal.        Mood and Affect: Mood normal.        Speech: Speech normal.        Behavior: Behavior normal. Behavior is cooperative.      UC Treatments / Results  Labs (all labs ordered are listed, but only abnormal results are displayed) Labs Reviewed  RESP PANEL BY RT-PCR (FLU A&B, COVID)  ARPGX2    EKG   Radiology No results found.  Procedures Procedures (including critical care time)  Medications Ordered in UC Medications - No data to display  Initial Impression / Assessment and Plan / UC Course  I have reviewed the triage vital signs and the nursing notes.  Pertinent labs & imaging results that were available during my care of the patient were reviewed by me and considered in my medical decision making (see chart for details).     Covid,flu test pending. Pt verbalized understanding to this provider.   Ddx: Viral illness, URI, allergies Final Clinical Impressions(s) / UC Diagnoses   Final diagnoses:  Viral URI with cough  Encounter for laboratory testing for COVID-19 virus     Discharge Instructions      Rest,push fluids, may use OTC meds for symptom management(Coricidin HBP,Afrin, chloraseptic throat lozenges, etc). Follow up with PCP. Check my chart for results, quarantine until results known.      ED Prescriptions   None    PDMP not reviewed this encounter.   Tori Milks, NP 19/50/93 1710

## 2022-07-06 ENCOUNTER — Encounter (HOSPITAL_COMMUNITY): Payer: Self-pay

## 2022-07-06 ENCOUNTER — Ambulatory Visit (HOSPITAL_COMMUNITY)
Admission: RE | Admit: 2022-07-06 | Discharge: 2022-07-06 | Disposition: A | Discharge status: Home / Self Care | Payer: No Typology Code available for payment source | Source: Ambulatory Visit | Attending: Physician Assistant | Admitting: Physician Assistant

## 2022-07-06 VITALS — BP 166/98 | HR 95 | Temp 98.7°F | Resp 18

## 2022-07-06 DIAGNOSIS — R062 Wheezing: Secondary | ICD-10-CM

## 2022-07-06 DIAGNOSIS — H9201 Otalgia, right ear: Secondary | ICD-10-CM

## 2022-07-06 DIAGNOSIS — J0100 Acute maxillary sinusitis, unspecified: Secondary | ICD-10-CM | POA: Diagnosis not present

## 2022-07-06 DIAGNOSIS — J22 Unspecified acute lower respiratory infection: Secondary | ICD-10-CM | POA: Diagnosis not present

## 2022-07-06 MED ORDER — FLUTICASONE PROPIONATE 50 MCG/ACT NA SUSP: 1.0000 | Freq: Every day | NASAL | 0 refills | Status: DC

## 2022-07-06 MED ORDER — SPACER/AERO-HOLDING CHAMBERS DEVI: 1.0000 [IU] | Freq: Three times a day (TID) | 0 refills | Status: AC

## 2022-07-06 MED ORDER — NEOMYCIN-POLYMYXIN-HC 3.5-10000-1 OT SUSP: 4.0000 [drp] | Freq: Three times a day (TID) | OTIC | 0 refills | Status: DC

## 2022-07-06 MED ORDER — PREDNISONE 10 MG PO TABS: 10.0000 mg | ORAL_TABLET | Freq: Three times a day (TID) | ORAL | 0 refills | Status: DC

## 2022-07-06 MED ORDER — AZITHROMYCIN 250 MG PO TABS: ORAL_TABLET | ORAL | 0 refills | Status: DC

## 2022-07-06 MED ORDER — ALBUTEROL SULFATE HFA 108 (90 BASE) MCG/ACT IN AERS: 2.0000 | INHALATION_SPRAY | Freq: Four times a day (QID) | RESPIRATORY_TRACT | 0 refills | Status: AC | PRN

## 2022-07-06 NOTE — Discharge Instructions (Signed)
Use the albuterol inhaler with spacer, 2 puffs every 6 hours on a regular basis to decrease wheezing, cough and shortness of breath. Advised to use the Flonase nasal spray, 2 sprays each nostril once daily to help decrease sinus and right ear congestion. Advise use the Cortisporin otic drops, 2 to 3 drops 2-3 times a day in the right ear to help decrease pain. Advised take the prednisone 10 mg 3 times a day for 5 days only to help decrease upper respiratory sinus congestion. Advised take Zithromax 250 mg, 2 tablets daily 1 tablet daily until completed to treat infection.  Advised follow-up PCP or return to urgent care as needed.

## 2022-07-06 NOTE — ED Triage Notes (Signed)
Pt presents to the office with ear pain,  nasal congestion and cough. Pt stated she been sick for 2 weeks. Pt is taking OTC with no relief.

## 2022-07-06 NOTE — ED Provider Notes (Signed)
[facility]    CSN: [id number] Arrival date & time: 07/06/22  1352      History   Chief Complaint Chief Complaint  Patient presents with   Ear Drainage    I can't hear out of my right ear - Entered by patient    HPI Krystal Edwards is a 37 y.o. female.   37 year old female presents with right ear pain, sinus congestion, cough.  Patient indicates for the past 2 weeks she has been having increasing upper respiratory congestion with nasal congestion, rhinitis, postnasal drip with purulent production.  Patient also indicates that she is having right ear pain, pressure, right frontal and maxillary sinus pain and pressure with intermittent off-balance sensation and dizziness.  Patient indicates she does have a history of having migraine headaches.  She also indicates having cough, chest congestion, intermittent wheezing shortness of breath which is mainly of the evening and first thing in the morning.  Patient indicates she does smoke on a regular basis.  She has been using some OTC medications without relief of her symptoms.  She is without fever, nausea, vomiting, or chills.   Ear Drainage Associated symptoms include shortness of breath.    Past Medical History:  Diagnosis Date   Carpal tunnel syndrome 03-2011   BILATERAL RECEIVES INJECTIONS   Choroid plexus cyst of fetus 08/06/2015   Diabetes mellitus without complication    Headache    Oligo-ovulation    Pre-eclampsia 12/02/2015    Patient Active Problem List   Diagnosis Date Noted   Viral URI with cough 03/04/2022   Encounter for laboratory testing for COVID-19 virus 03/04/2022   Snores 10/18/2017   Periumbilical abdominal pain 09/28/2017   GERD (gastroesophageal reflux disease) 12/28/2016   PCOS (polycystic ovarian syndrome) 12/28/2016   Family history of diabetes mellitus 12/28/2016   Tobacco dependence 12/28/2016   Essential hypertension 12/28/2016   DOE (dyspnea on exertion) 12/28/2016   Diabetes mellitus  without complication 12/28/2016   Chronic daily headache 02/12/2016   Neck pain 08/15/2015   Occipital neuralgia of right side 08/15/2015   Laryngopharyngeal reflux (LPR) 02/26/2014   Cough 02/26/2014    Past Surgical History:  Procedure Laterality Date   BREAST BIOPSY Left 04/2007   benign   BREAST BIOPSY Left 03/02/2022   [place] LT BREAST BX W LOC DEV 1ST LESION IMG BX SPEC US GUIDE 03/02/2022 [facility]   BREAST SURGERY  2009   left breast bx-benign   HERNIA REPAIR      OB History     Gravida  3   Para  1   Term  1   Preterm      AB  2   Living  1      SAB  2   IAB      Ectopic      Multiple  0   Live Births  1            Home Medications    Prior to Admission medications   Medication Sig Start Date End Date Taking? Authorizing Provider  albuterol (VENTOLIN HFA) 108 (90 Base) MCG/ACT inhaler Inhale 2 puffs into the lungs every 6 (six) hours as needed for wheezing or shortness of breath. 07/06/22  Yes Ellsworth Lennox, PA-C  azithromycin (ZITHROMAX Z-PAK) 250 MG tablet 2 tablets initially and then 1 tablet daily until completed. 07/06/22  Yes Ellsworth Lennox, PA-C  Blood Glucose Monitoring Suppl [facility] PRESTO) w/Device KIT Check blood glucose twice daily before meals 12/22/19  Yes Julieanne Manson, MD  metFORMIN (GLUCOPHAGE-XR) 500 MG 24 hr tablet TAKE 1 TABLET BY MOUTH TWO TIMES DAILY WITH MEALS 04/11/21  Yes Julieanne Manson, MD  neomycin-polymyxin-hydrocortisone (CORTISPORIN) 3.5-10000-1 OTIC suspension Place 4 drops into the right ear 3 (three) times daily. 07/06/22  Yes Ellsworth Lennox, PA-C  predniSONE (DELTASONE) 10 MG tablet Take 1 tablet (10 mg total) by mouth in the morning, at noon, and at bedtime. 07/06/22  Yes Ellsworth Lennox, PA-C  Spacer/Aero-Holding Chambers DEVI 1 Units by Does not apply route in the morning, at noon, and at bedtime. 07/06/22  Yes Ellsworth Lennox, PA-C  AgaMatrix Ultra-Thin Lancets MISC Check blood glucose twice daily before  meals 12/22/19   Julieanne Manson, MD  aspirin-acetaminophen-caffeine [facility] EXTRA STRENGTH) [phone] MG tablet Take 2 tablets by mouth every 6 (six) hours as needed for headache or migraine.    [provider]  fluticasone (FLONASE) 50 MCG/ACT nasal spray Place 1 spray into both nostrils daily. 07/06/22   Ellsworth Lennox, PA-C  glucose blood (AGAMATRIX PRESTO TEST) test strip Check blood glucose twice daily before meals 12/22/19   Julieanne Manson, MD  hydrochlorothiazide (HYDRODIURIL) 25 MG tablet Take 1 tablet (25 mg total) by mouth daily. Patient not taking: Reported on 12/22/2019 02/12/19   Muthersbaugh, Dahlia Client, PA-C  lisinopril (ZESTRIL) 10 MG tablet Take 1 tablet (10 mg total) by mouth daily. Patient not taking: Reported on 04/02/2020 02/09/20   Julieanne Manson, MD  omeprazole (PRILOSEC) 40 MG capsule TAKE 1 CAPSULE BY MOUTH DAILY. TAKE 30 MINUTES PRIOR TO A MEAL 04/17/20   Julieanne Manson, MD  varenicline (CHANTIX STARTING MONTH PAK) 0.5 MG X 11 & 1 MG X 42 tablet TAKE 1 TABLET (0.5MG ) BY MOUTH DAILY FOR 3 DAYS,THEN INCREASE TO 1 TABLET (0.5MG ) TWICE DAILY FOR 4 DAYS, THEN INCREASE TO 1 TABLET (1MG ) TWICE DAILY 04/17/20   Julieanne Manson, MD  varenicline (CHANTIX) 1 MG tablet 1 tab by mouth twice daily to start following completion of starter pack Patient not taking: Reported on 04/02/2020 12/22/19   Julieanne Manson, MD    Family History Family History  Problem Relation Age of Onset   Breast cancer Mother 30       2005 AND 2013   Diabetes Father    Diabetes Maternal Grandfather    Breast cancer Paternal Grandmother    Breast cancer Paternal Grandfather     Social History Social History   Tobacco Use   Smoking status: Every Day    Packs/day: 0.50    Years: 12.00    Additional pack years: 0.00    Total pack years: 6.00    Types: Cigarettes   Smokeless tobacco: Never  Vaping Use   Vaping Use: Never used  Substance Use Topics   Alcohol use: No     Alcohol/week: 0.0 standard drinks of alcohol   Drug use: No     Allergies   Patient has no known allergies.   Review of Systems Review of Systems  HENT:  Positive for ear pain (right ear), sinus pressure and sinus pain (right frontal and maxillary).   Respiratory:  Positive for shortness of breath and wheezing.      Physical Exam Triage Vital Signs ED Triage Vitals [07/06/22 1408]  Enc Vitals Group     BP (!) 166/98     Pulse Rate 95     Resp 18     Temp 98.7 F (37.1 C)     Temp Source Oral     SpO2 95 %  Weight      Height      Head Circumference      Peak Flow      Pain Score      Pain Loc      Pain Edu?      Excl. in GC?    No data found.  Updated Vital Signs BP (!) 166/98 (BP Location: Left Arm)   Pulse 95   Temp 98.7 F (37.1 C) (Oral)   Resp 18   LMP 07/03/2022   SpO2 95%   Visual Acuity Right Eye Distance:   Left Eye Distance:   Bilateral Distance:    Right Eye Near:   Left Eye Near:    Bilateral Near:     Physical Exam Constitutional:      Appearance: Normal appearance.  HENT:     Right Ear: Tenderness (right ear tragus with minimal swelling.) present. Tympanic membrane is injected.     Left Ear: Tympanic membrane and ear canal normal.     Mouth/Throat:     Mouth: Mucous membranes are moist.     Pharynx: Oropharynx is clear.     Comments: Facial: Pain is palpated along the right frontal and maxillary sinus, along with pain palpated right TMJ area. Cardiovascular:     Rate and Rhythm: Normal rate and regular rhythm.     Heart sounds: Normal heart sounds.  Pulmonary:     Effort: Pulmonary effort is normal.     Breath sounds: Normal air entry. Examination of the right-upper field reveals rhonchi. Examination of the left-upper field reveals rhonchi. Rhonchi (mild) present. No wheezing or rales.  Lymphadenopathy:     Cervical: No cervical adenopathy.  Neurological:     Mental Status: She is alert.      UC Treatments / Results   Labs (all labs ordered are listed, but only abnormal results are displayed) Labs Reviewed - No data to display  EKG   Radiology No results found.  Procedures Procedures (including critical care time)  Medications Ordered in UC Medications - No data to display  Initial Impression / Assessment and Plan / UC Course  I have reviewed the triage vital signs and the nursing notes.  Pertinent labs & imaging results that were available during my care of the patient were reviewed by me and considered in my medical decision making (see chart for details).    Plan: The diagnosis will be treated with the following: 1.  Right ear pain: A.  Cortisporin otic solution, 3 drops in the ear 3 times a day. 2.  Acute maxillary sinusitis right side: A.  Flonase nasal spray, 2 sprays each nostril once a day to decrease congestion. B.  Prednisone 10 mg 3 times a day for 5 days only to help decrease sinus congestion. C.  Zithromax 250 mg, 2 tablets initially and then 1 tablet daily. 3.  Lower respiratory tract infection: A.  Albuterol inhaler with spacer, 2 puffs every 6 hours to decrease chest congestion and wheezing. 4.  Wheezing: A.  Albuterol inhaler with spacer, 2 puffs every 6 hours to decrease chest congestion and wheezing. B.  Prednisone 10 mg 3 times a day for 5 days only to help reduce inflammatory component. 5.  Advised follow-up PCP return to urgent care as needed. Final Clinical Impressions(s) / UC Diagnoses   Final diagnoses:  Right ear pain  Acute non-recurrent maxillary sinusitis  Lower respiratory infection  Wheezing     Discharge Instructions      Use  the albuterol inhaler with spacer, 2 puffs every 6 hours on a regular basis to decrease wheezing, cough and shortness of breath. Advised to use the Flonase nasal spray, 2 sprays each nostril once daily to help decrease sinus and right ear congestion. Advise use the Cortisporin otic drops, 2 to 3 drops 2-3 times a day in the  right ear to help decrease pain. Advised take the prednisone 10 mg 3 times a day for 5 days only to help decrease upper respiratory sinus congestion. Advised take Zithromax 250 mg, 2 tablets daily 1 tablet daily until completed to treat infection.  Advised follow-up PCP or return to urgent care as needed.    ED Prescriptions     Medication Sig Dispense Auth. Provider   fluticasone (FLONASE) 50 MCG/ACT nasal spray Place 1 spray into both nostrils daily. 16 g Ellsworth Lennox, PA-C   azithromycin (ZITHROMAX Z-PAK) 250 MG tablet 2 tablets initially and then 1 tablet daily until completed. 6 each Ellsworth Lennox, PA-C   albuterol (VENTOLIN HFA) 108 (90 Base) MCG/ACT inhaler Inhale 2 puffs into the lungs every 6 (six) hours as needed for wheezing or shortness of breath. 8 g Ellsworth Lennox, PA-C   Spacer/Aero-Holding Chambers DEVI 1 Units by Does not apply route in the morning, at noon, and at bedtime. 1 Units Ellsworth Lennox, PA-C   predniSONE (DELTASONE) 10 MG tablet Take 1 tablet (10 mg total) by mouth in the morning, at noon, and at bedtime. 15 tablet Ellsworth Lennox, PA-C   neomycin-polymyxin-hydrocortisone (CORTISPORIN) 3.5-10000-1 OTIC suspension Place 4 drops into the right ear 3 (three) times daily. 10 mL Ellsworth Lennox, PA-C      PDMP not reviewed this encounter.   Ellsworth Lennox, PA-C 07/06/22 1441

## 2022-08-22 HISTORY — PX: BREAST EXCISIONAL BIOPSY: SUR124

## 2022-08-25 ENCOUNTER — Other Ambulatory Visit: Payer: Self-pay | Admitting: Surgery

## 2022-08-25 DIAGNOSIS — D0502 Lobular carcinoma in situ of left breast: Secondary | ICD-10-CM

## 2022-08-26 ENCOUNTER — Other Ambulatory Visit: Payer: Self-pay | Admitting: Surgery

## 2022-08-26 DIAGNOSIS — D0502 Lobular carcinoma in situ of left breast: Secondary | ICD-10-CM

## 2022-09-11 ENCOUNTER — Encounter (HOSPITAL_BASED_OUTPATIENT_CLINIC_OR_DEPARTMENT_OTHER): Payer: Self-pay | Admitting: Surgery

## 2022-09-11 ENCOUNTER — Other Ambulatory Visit: Payer: Self-pay

## 2022-09-11 NOTE — Progress Notes (Signed)
   09/11/22 1124  Pre-op Phone Call  Surgery Date Verified 09/17/22  Arrival Time Verified 1200  Surgery Location Verified [facility] [place]  Medical History Reviewed Yes  Is the patient taking a GLP-1 receptor agonist? No  Does the patient have diabetes? Type II  Does the patient use a Continuous Blood Glucose Monitor? (S)  Yes  Location of sensor? (S)   (not currently wearing/ instucted to place on abdomen or buttock dos)  Is the patient on an insulin pump? No  Has the diabetes coordinator been notified? No  Do you have a history of heart problems? No  Antiarrhythmic device type  (n/a)  Does patient have other implanted devices? No  Patient Teaching Pre / Post Procedure;Pre-op CHG Bathing;Enhanced Recovery  Patient educated about smoking cessation 24 hours prior to surgery. (S)  Yes  Patient verbalizes understanding of bowel prep? N/A  Med Rec Completed Yes  Take the Following Meds the Morning of Surgery no meds dos  Recent  Lab Work, EKG, CXR? No  NPO (Including gum & candy) After midnight  Patient instructed to stop clear liquids including Carb loading drink at: 1000  Stop Solids, Milk, Candy, and Gum STARTING AT MIDNIGHT  Responsible adult to drive and be with you for 24 hours? Yes  Name & Phone Number for Ride/Caregiver mom Felicia  No Jewelry, money, nail polish or make-up.  No lotions, powders, perfumes. No shaving  48 hrs. prior to surgery. Yes  Contacts, Dentures & Glasses Will Have to be Removed Before OR. Yes  Please bring your ID and Insurance Card the morning of your surgery. (Surgery Centers Only) Yes  Bring any papers or x-rays with you that your surgeon gave you. Yes  Instructed to contact the location of procedure/ provider if they or anyone in their household develops symptoms or tests positive for COVID-19, has close contact with someone who tests positive for COVID, or has known exposure to any contagious illness. Yes  Call this number the morning of surgery  with any  problems that may cancel your surgery. [phone]   Coming for PAT appt/ bmet & EKG.

## 2022-09-15 ENCOUNTER — Encounter (HOSPITAL_BASED_OUTPATIENT_CLINIC_OR_DEPARTMENT_OTHER)
Admission: RE | Admit: 2022-09-15 | Discharge: 2022-09-15 | Disposition: A | Discharge status: Home / Self Care | Payer: No Typology Code available for payment source | Source: Ambulatory Visit | Attending: Surgery | Admitting: Surgery

## 2022-09-15 ENCOUNTER — Other Ambulatory Visit: Payer: Self-pay

## 2022-09-15 DIAGNOSIS — Z01812 Encounter for preprocedural laboratory examination: Secondary | ICD-10-CM | POA: Diagnosis not present

## 2022-09-15 DIAGNOSIS — E119 Type 2 diabetes mellitus without complications: Secondary | ICD-10-CM | POA: Insufficient documentation

## 2022-09-15 LAB — BASIC METABOLIC PANEL
Anion gap: 11 (ref 5–15)
BUN: 8 mg/dL (ref 6–20)
CO2: 21 mmol/L — ABNORMAL LOW (ref 22–32)
Calcium: 9 mg/dL (ref 8.9–10.3)
Chloride: 98 mmol/L (ref 98–111)
Creatinine, Ser: 0.78 mg/dL (ref 0.44–1.00)
GFR, Estimated: >60 mL/min (ref >60)
Glucose, Bld: 304 mg/dL — ABNORMAL HIGH (ref 70–99)
Potassium: 4 mmol/L (ref 3.5–5.1)
Sodium: 130 mmol/L — ABNORMAL LOW (ref 135–145)

## 2022-09-15 LAB — POCT PREGNANCY, URINE: Preg Test, Ur: NEGATIVE

## 2022-09-15 MED ORDER — CHLORHEXIDINE GLUCONATE CLOTH 2 % EX PADS: 6.0000 | MEDICATED_PAD | Freq: Once | CUTANEOUS | Status: DC

## 2022-09-15 MED ORDER — ENSURE PRE-SURGERY PO LIQD: 296.0000 mL | Freq: Once | ORAL | Status: DC

## 2022-09-15 NOTE — Progress Notes (Signed)

## 2022-09-15 NOTE — Anesthesia Preprocedure Evaluation (Signed)
Anesthesia Evaluation  Patient identified by MRN, date of birth, ID band Patient awake    Reviewed: Allergy & Precautions, NPO status , Patient's Chart, lab work & pertinent test results  History of Anesthesia Complications Negative for: history of anesthetic complications  Airway Mallampati: III  TM Distance: >3 FB Neck ROM: Full    Dental  (+) Dental Advisory Given   Pulmonary neg shortness of breath, sleep apnea , neg COPD, neg recent URI, Current Smoker   Pulmonary exam normal breath sounds clear to auscultation       Cardiovascular hypertension (lisinopril), Pt. on medications (-) angina (-) Past MI, (-) Cardiac Stents and (-) CABG (-) dysrhythmias  Rhythm:Regular Rate:Normal     Neuro/Psych  Headaches, neg Seizures  Neuromuscular disease (right occipital neuralgia)    GI/Hepatic Neg liver ROS,GERD  ,,  Endo/Other  diabetes (On Ozempic (last dose 09/14/22)), Type 2, Oral Hypoglycemic Agents  Morbid obesity  Renal/GU negative Renal ROS     Musculoskeletal   Abdominal  (+) + obese  Peds  Hematology negative hematology ROS (+)   Anesthesia Other Findings Left breast LCIS  Just started taking Ozempic on Monday, 09/14/2023.  Reproductive/Obstetrics                             Anesthesia Physical Anesthesia Plan  ASA: 3  Anesthesia Plan: General   Post-op Pain Management: Tylenol PO (pre-op)*   Induction: Intravenous and Rapid sequence  PONV Risk Score and Plan: 2 and Ondansetron, Dexamethasone and Treatment may vary due to age or medical condition  Airway Management Planned: Oral ETT  Additional Equipment:   Intra-op Plan:   Post-operative Plan: Extubation in OR  Informed Consent: I have reviewed the patients History and Physical, chart, labs and discussed the procedure including the risks, benefits and alternatives for the proposed anesthesia with the patient or authorized  representative who has indicated his/her understanding and acceptance.     Dental advisory given  Plan Discussed with: CRNA and Anesthesiologist  Anesthesia Plan Comments: (Risks of general anesthesia discussed including, but not limited to, sore throat, hoarse voice, chipped/damaged teeth, injury to vocal cords, nausea and vomiting, allergic reactions, lung infection, heart attack, stroke, and death. All questions answered. )        Anesthesia Quick Evaluation

## 2022-09-16 ENCOUNTER — Ambulatory Visit
Admission: RE | Admit: 2022-09-16 | Discharge: 2022-09-16 | Disposition: A | Discharge status: Home / Self Care | Payer: No Typology Code available for payment source | Source: Ambulatory Visit | Attending: Surgery | Admitting: Surgery

## 2022-09-16 DIAGNOSIS — D0502 Lobular carcinoma in situ of left breast: Secondary | ICD-10-CM

## 2022-09-16 HISTORY — PX: BREAST BIOPSY: SHX20

## 2022-09-16 NOTE — Progress Notes (Signed)
Reviewed labs (gluc=[id number]) with Dr Salvadore Farber who said OK to proceed with surgery as planned

## 2022-09-16 NOTE — H&P (Signed)
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 female who is seen today as an office consultation for evaluation of NEW BREAST CANCER (Left lobular carcinoma in situ)  This is a 37 year old female who had undergone a stereotactic biopsy of her left breast in December 2023 with the finding of LCIS. She was going to be scheduled to see someone in our office when she developed COVID and pneumonia and had to be hospitalized. She is here t now to address the findings on her biopsy  She reports has had previous benign biopsies in the past. She denies nipple discharge.  She has a very strong family history of breast cancer with her paternal grandfather and grandmother diagnosed with breast cancer and both have passed from that. Her mother has also had breast cancer.  She reports she is now stable from a cardiopulmonary standpoint and is otherwise doing well.  Review of Systems: A complete review of systems was obtained from the patient. I have reviewed this information and discussed as appropriate with the patient. See HPI as well for other ROS.  ROS  Medical History: Past Medical History: Diagnosis Date Arthritis Diabetes mellitus without complication (CMS/HHS-HCC) GERD (gastroesophageal reflux disease) Hypertension Liver disease Sleep apnea  There is no problem list on file for this patient.  History reviewed. No pertinent surgical history.  No Known Allergies  Current Outpatient Medications on File Prior to Visit Medication Sig Dispense Refill metFORMIN (GLUCOPHAGE) 1000 MG tablet Take 1,000 mg by mouth 2 (two) times daily lisinopriL (ZESTRIL) 20 MG tablet Take 20 mg by mouth once daily  No current facility-administered medications on file prior to  visit.  History reviewed. No pertinent family history.  Social History  Tobacco Use Smoking Status Every Day Current packs/day: 1.00 Types: Cigarettes Smokeless Tobacco Never   Social History  Socioeconomic History Marital status: Single Tobacco Use Smoking status: Every Day Current packs/day: 1.00 Types: Cigarettes Smokeless tobacco: Never Substance and Sexual Activity Alcohol use: Yes Drug use: Not Currently  Social Determinants of Health  Financial Resource Strain: Low Risk (12/22/2019) Received from [facility] Overall Financial Resource Strain (CARDIA) Difficulty of Paying Living Expenses: Not very hard Food Insecurity: No Food Insecurity (12/22/2019) Received from [facility] Hunger Vital Sign Worried About Running Out of Food in the Last Year: Never true Ran Out of Food in the Last Year: Never true Transportation Needs: No Transportation Needs (04/02/2020) Received from [facility] - Transportation Lack of Transportation (Medical): No Lack of Transportation (Non-Medical): No Stress: No Stress Concern Present (12/22/2019) Received from [facility] - Occupational Stress Questionnaire Feeling of Stress : Only a little Social Connections: Moderately Isolated (12/22/2019) Received from [facility] Social Connection and Isolation Panel [NHANES] Frequency of Communication with Friends and Family: More than three times a week Frequency of Social Gatherings with Friends and Family: Once a week Attends Religious Services: More than 4 times per year Active Member of Clubs or Organizations: No Attends Banker Meetings: Never Marital Status: Never married  Objective:  Vitals: 08/25/22 0954 08/25/22 0955 BP: (!) 161/101 Pulse: 96 Temp: 36.8 C (98.3 F) SpO2: 99% Weight: (!) 119.3 kg (263 lb) Height: 172.7 cm (5\' 8" ) PainSc: 0-No pain PainLoc: Breast  Body mass index is 39.99 kg/m.  Physical  Exam  She appears well on exam  There are no palpable breast masses in either breast and no axillary adenopathy. The nipple areolar complexes are normal.  Labs, Imaging and Diagnostic Testing: I reviewed the pathology showing lobular carcinoma in situ. I reviewed her previous mammograms and ultrasound as well  Assessment and Plan:  Diagnoses and all orders for this visit:  Lobular carcinoma in situ (LCIS) of left breast - Ambulatory Referral to Oncology-Medical - Ambulatory Referral to Cancer Genetics - [id number]   I discussed the diagnosis with the patient. She is well aware of her significant family history and is interested in genetic testing. I will refer her to the cancer center to see medical oncology and genetics. From a surgical standpoint I am recommending proceeding with a radioactive seed guided left breast lumpectomy to remove this area in the left breast to completely rule out invasive cancer especially in light of her family history. I explained the surgical procedure in detail. We discussed the risks which includes but is not limited to bleeding, infection, injury to surrounding structures, the need for further surgery if invasive malignancy is found, cardiopulmonary issues with anesthesia, postoperative recovery, etc. She understands and agrees with the plans.

## 2022-09-17 ENCOUNTER — Ambulatory Visit (HOSPITAL_BASED_OUTPATIENT_CLINIC_OR_DEPARTMENT_OTHER): Payer: No Typology Code available for payment source | Admitting: Anesthesiology

## 2022-09-17 ENCOUNTER — Encounter (HOSPITAL_BASED_OUTPATIENT_CLINIC_OR_DEPARTMENT_OTHER)
Admission: RE | Disposition: A | Discharge status: Home / Self Care | Payer: Self-pay | Source: Home / Self Care | Attending: Surgery

## 2022-09-17 ENCOUNTER — Other Ambulatory Visit: Payer: Self-pay

## 2022-09-17 ENCOUNTER — Ambulatory Visit (HOSPITAL_BASED_OUTPATIENT_CLINIC_OR_DEPARTMENT_OTHER)
Admission: RE | Admit: 2022-09-17 | Discharge: 2022-09-17 | Disposition: A | Discharge status: Home / Self Care | Payer: No Typology Code available for payment source | Attending: Surgery | Admitting: Surgery

## 2022-09-17 ENCOUNTER — Ambulatory Visit
Admission: RE | Admit: 2022-09-17 | Discharge: 2022-09-17 | Disposition: A | Discharge status: Home / Self Care | Payer: No Typology Code available for payment source | Source: Ambulatory Visit | Attending: Surgery | Admitting: Surgery

## 2022-09-17 ENCOUNTER — Encounter (HOSPITAL_BASED_OUTPATIENT_CLINIC_OR_DEPARTMENT_OTHER): Payer: Self-pay | Admitting: Surgery

## 2022-09-17 DIAGNOSIS — E119 Type 2 diabetes mellitus without complications: Secondary | ICD-10-CM | POA: Diagnosis not present

## 2022-09-17 DIAGNOSIS — F17210 Nicotine dependence, cigarettes, uncomplicated: Secondary | ICD-10-CM | POA: Diagnosis not present

## 2022-09-17 DIAGNOSIS — D0502 Lobular carcinoma in situ of left breast: Secondary | ICD-10-CM

## 2022-09-17 DIAGNOSIS — I10 Essential (primary) hypertension: Secondary | ICD-10-CM

## 2022-09-17 DIAGNOSIS — Z7984 Long term (current) use of oral hypoglycemic drugs: Secondary | ICD-10-CM

## 2022-09-17 DIAGNOSIS — Z7985 Long-term (current) use of injectable non-insulin antidiabetic drugs: Secondary | ICD-10-CM | POA: Insufficient documentation

## 2022-09-17 DIAGNOSIS — Z6839 Body mass index (BMI) 39.0-39.9, adult: Secondary | ICD-10-CM | POA: Insufficient documentation

## 2022-09-17 DIAGNOSIS — Z01818 Encounter for other preprocedural examination: Secondary | ICD-10-CM

## 2022-09-17 DIAGNOSIS — Z79899 Other long term (current) drug therapy: Secondary | ICD-10-CM | POA: Diagnosis not present

## 2022-09-17 DIAGNOSIS — Z803 Family history of malignant neoplasm of breast: Secondary | ICD-10-CM | POA: Insufficient documentation

## 2022-09-17 HISTORY — PX: BREAST LUMPECTOMY WITH RADIOACTIVE SEED LOCALIZATION: SHX6424

## 2022-09-17 HISTORY — DX: Sleep apnea, unspecified: G47.30

## 2022-09-17 LAB — GLUCOSE, CAPILLARY
Glucose-Capillary: 216 mg/dL — ABNORMAL HIGH (ref 70–99)
Glucose-Capillary: 209 mg/dL — ABNORMAL HIGH (ref 70–99)

## 2022-09-17 SURGERY — BREAST LUMPECTOMY WITH RADIOACTIVE SEED LOCALIZATION
Anesthesia: General | Site: Breast | Laterality: Left

## 2022-09-17 MED ORDER — FENTANYL CITRATE (PF) 100 MCG/2ML IJ SOLN: 25.0000 ug | INTRAMUSCULAR | Status: DC | PRN

## 2022-09-17 MED ORDER — MIDAZOLAM HCL 5 MG/5ML IJ SOLN
INTRAMUSCULAR | Status: DC | PRN
  Administered 2022-09-17 (×2): 2 mg via INTRAVENOUS

## 2022-09-17 MED ORDER — AMISULPRIDE (ANTIEMETIC) 5 MG/2ML IV SOLN: 10.0000 mg | Freq: Once | INTRAVENOUS | Status: DC | PRN

## 2022-09-17 MED ORDER — LACTATED RINGERS IV SOLN: INTRAVENOUS | Status: DC

## 2022-09-17 MED ORDER — LIDOCAINE 2% (20 MG/ML) 5 ML SYRINGE
INTRAMUSCULAR | Status: AC
  Filled 2022-09-17: qty 5

## 2022-09-17 MED ORDER — FENTANYL CITRATE (PF) 100 MCG/2ML IJ SOLN
INTRAMUSCULAR | Status: AC
  Filled 2022-09-17: qty 2

## 2022-09-17 MED ORDER — BUPIVACAINE HCL 0.25 % IJ SOLN
INTRAMUSCULAR | Status: DC | PRN
  Administered 2022-09-17: 20 mL

## 2022-09-17 MED ORDER — PROPOFOL 10 MG/ML IV BOLUS
INTRAVENOUS | Status: DC | PRN
  Administered 2022-09-17: 150 mg via INTRAVENOUS

## 2022-09-17 MED ORDER — ACETAMINOPHEN 500 MG PO TABS
1000.0000 mg | ORAL_TABLET | ORAL | Status: AC
  Administered 2022-09-17: 1000 mg via ORAL

## 2022-09-17 MED ORDER — CEFAZOLIN SODIUM-DEXTROSE 2-4 GM/100ML-% IV SOLN
2.0000 g | INTRAVENOUS | Status: AC
  Administered 2022-09-17: 2 g via INTRAVENOUS

## 2022-09-17 MED ORDER — CEFAZOLIN SODIUM-DEXTROSE 1-4 GM/50ML-% IV SOLN
INTRAVENOUS | Status: AC
  Filled 2022-09-17: qty 50

## 2022-09-17 MED ORDER — SUCCINYLCHOLINE CHLORIDE 200 MG/10ML IV SOSY
PREFILLED_SYRINGE | INTRAVENOUS | Status: DC | PRN
  Administered 2022-09-17: 120 mg via INTRAVENOUS

## 2022-09-17 MED ORDER — TRAMADOL HCL 50 MG PO TABS: 50.0000 mg | ORAL_TABLET | Freq: Four times a day (QID) | ORAL | 0 refills | Status: AC | PRN

## 2022-09-17 MED ORDER — MIDAZOLAM HCL 2 MG/2ML IJ SOLN
INTRAMUSCULAR | Status: AC
  Filled 2022-09-17: qty 2

## 2022-09-17 MED ORDER — KETOROLAC TROMETHAMINE 30 MG/ML IJ SOLN
INTRAMUSCULAR | Status: AC
  Filled 2022-09-17: qty 1

## 2022-09-17 MED ORDER — PHENYLEPHRINE 80 MCG/ML (10ML) SYRINGE FOR IV PUSH (FOR BLOOD PRESSURE SUPPORT)
PREFILLED_SYRINGE | INTRAVENOUS | Status: AC
  Filled 2022-09-17: qty 10

## 2022-09-17 MED ORDER — KETOROLAC TROMETHAMINE 30 MG/ML IJ SOLN
INTRAMUSCULAR | Status: DC | PRN
  Administered 2022-09-17: 30 mg via INTRAVENOUS

## 2022-09-17 MED ORDER — SUCCINYLCHOLINE CHLORIDE 200 MG/10ML IV SOSY
PREFILLED_SYRINGE | INTRAVENOUS | Status: AC
  Filled 2022-09-17: qty 10

## 2022-09-17 MED ORDER — OXYCODONE HCL 5 MG/5ML PO SOLN: 5.0000 mg | Freq: Once | ORAL | Status: DC | PRN

## 2022-09-17 MED ORDER — CEFAZOLIN SODIUM-DEXTROSE 2-4 GM/100ML-% IV SOLN
INTRAVENOUS | Status: AC
  Filled 2022-09-17: qty 100

## 2022-09-17 MED ORDER — FENTANYL CITRATE (PF) 100 MCG/2ML IJ SOLN
INTRAMUSCULAR | Status: DC | PRN
  Administered 2022-09-17 (×4): 50 ug via INTRAVENOUS

## 2022-09-17 MED ORDER — ONDANSETRON HCL 4 MG/2ML IJ SOLN
INTRAMUSCULAR | Status: DC | PRN
  Administered 2022-09-17: 4 mg via INTRAVENOUS

## 2022-09-17 MED ORDER — DEXAMETHASONE SODIUM PHOSPHATE 4 MG/ML IJ SOLN
INTRAMUSCULAR | Status: DC | PRN
  Administered 2022-09-17: 10 mg via INTRAVENOUS

## 2022-09-17 MED ORDER — DEXAMETHASONE SODIUM PHOSPHATE 10 MG/ML IJ SOLN
INTRAMUSCULAR | Status: AC
  Filled 2022-09-17: qty 1

## 2022-09-17 MED ORDER — ACETAMINOPHEN 500 MG PO TABS
ORAL_TABLET | ORAL | Status: AC
  Filled 2022-09-17: qty 2

## 2022-09-17 MED ORDER — LIDOCAINE HCL (CARDIAC) PF 100 MG/5ML IV SOSY
PREFILLED_SYRINGE | INTRAVENOUS | Status: DC | PRN
  Administered 2022-09-17: 100 mg via INTRAVENOUS

## 2022-09-17 MED ORDER — OXYCODONE HCL 5 MG PO TABS: 5.0000 mg | ORAL_TABLET | Freq: Once | ORAL | Status: DC | PRN

## 2022-09-17 MED ORDER — EPHEDRINE 5 MG/ML INJ
INTRAVENOUS | Status: AC
  Filled 2022-09-17: qty 5

## 2022-09-17 MED ORDER — ONDANSETRON HCL 4 MG/2ML IJ SOLN
INTRAMUSCULAR | Status: AC
  Filled 2022-09-17: qty 2

## 2022-09-17 SURGICAL SUPPLY — 47 items
ADH SKN CLS APL DERMABOND .7 (GAUZE/BANDAGES/DRESSINGS) ×1
APL PRP STRL LF DISP 70% ISPRP (MISCELLANEOUS) ×1
APPLIER CLIP 9.375 MED OPEN (MISCELLANEOUS)
APR CLP MED 9.3 20 MLT OPN (MISCELLANEOUS)
BINDER BREAST 3XL (GAUZE/BANDAGES/DRESSINGS) IMPLANT
BINDER BREAST LRG (GAUZE/BANDAGES/DRESSINGS) IMPLANT
BINDER BREAST MEDIUM (GAUZE/BANDAGES/DRESSINGS) IMPLANT
BINDER BREAST XLRG (GAUZE/BANDAGES/DRESSINGS) IMPLANT
BINDER BREAST XXLRG (GAUZE/BANDAGES/DRESSINGS) IMPLANT
BLADE SURG 15 STRL LF DISP TIS (BLADE) ×1 IMPLANT
BLADE SURG 15 STRL SS (BLADE) ×1
CANISTER SUC SOCK COL 7IN (MISCELLANEOUS) IMPLANT
CANISTER SUCT 1200ML W/VALVE (MISCELLANEOUS) IMPLANT
CHLORAPREP W/TINT 26 (MISCELLANEOUS) ×1 IMPLANT
CLIP APPLIE 9.375 MED OPEN (MISCELLANEOUS) IMPLANT
COVER BACK TABLE 60X90IN (DRAPES) ×1 IMPLANT
COVER MAYO STAND STRL (DRAPES) ×1 IMPLANT
COVER PROBE CYLINDRICAL 5X96 (MISCELLANEOUS) ×1 IMPLANT
DERMABOND ADVANCED .7 DNX12 (GAUZE/BANDAGES/DRESSINGS) ×1 IMPLANT
DRAPE LAPAROSCOPIC ABDOMINAL (DRAPES) ×1 IMPLANT
DRAPE UTILITY XL STRL (DRAPES) ×1 IMPLANT
ELECT REM PT RETURN 9FT ADLT (ELECTROSURGICAL) ×1
ELECTRODE REM PT RTRN 9FT ADLT (ELECTROSURGICAL) ×1 IMPLANT
GAUZE SPONGE 4X4 12PLY STRL LF (GAUZE/BANDAGES/DRESSINGS) IMPLANT
GLOVE SURG SIGNA 7.5 PF LTX (GLOVE) ×1 IMPLANT
GOWN STRL REUS W/ TWL LRG LVL3 (GOWN DISPOSABLE) ×1 IMPLANT
GOWN STRL REUS W/ TWL XL LVL3 (GOWN DISPOSABLE) ×1 IMPLANT
GOWN STRL REUS W/TWL LRG LVL3 (GOWN DISPOSABLE) ×1
GOWN STRL REUS W/TWL XL LVL3 (GOWN DISPOSABLE) ×1
KIT MARKER MARGIN INK (KITS) ×1 IMPLANT
NDL HYPO 25X1 1.5 SAFETY (NEEDLE) ×1 IMPLANT
NEEDLE HYPO 25X1 1.5 SAFETY (NEEDLE) ×1 IMPLANT
NS IRRIG 1000ML POUR BTL (IV SOLUTION) IMPLANT
PACK BASIN DAY SURGERY FS (CUSTOM PROCEDURE TRAY) ×1 IMPLANT
PENCIL SMOKE EVACUATOR (MISCELLANEOUS) ×1 IMPLANT
SLEEVE SCD COMPRESS KNEE MED (STOCKING) ×1 IMPLANT
SPIKE FLUID TRANSFER (MISCELLANEOUS) IMPLANT
SPONGE T-LAP 4X18 ~~LOC~~+RFID (SPONGE) ×1 IMPLANT
SUT MNCRL AB 4-0 PS2 18 (SUTURE) ×1 IMPLANT
SUT SILK 2 0 SH (SUTURE) IMPLANT
SUT VIC AB 3-0 SH 27 (SUTURE) ×1
SUT VIC AB 3-0 SH 27X BRD (SUTURE) ×1 IMPLANT
SYR CONTROL 10ML LL (SYRINGE) ×1 IMPLANT
TOWEL GREEN STERILE FF (TOWEL DISPOSABLE) ×1 IMPLANT
TRAY FAXITRON CT DISP (TRAY / TRAY PROCEDURE) ×1 IMPLANT
TUBE CONNECTING 20X1/4 (TUBING) IMPLANT
YANKAUER SUCT BULB TIP NO VENT (SUCTIONS) IMPLANT

## 2022-09-17 NOTE — Interval H&P Note (Signed)
History and Physical Interval Note: no change in H and P  09/17/2022 10:54 AM  Krystal Edwards  has presented today for surgery, with the diagnosis of LEFT BREAST LCIS.  The various methods of treatment have been discussed with the patient and family. After consideration of risks, benefits and other options for treatment, the patient has consented to  Procedure(s): LEFT BREAST LUMPECTOMY WITH RADIOACTIVE SEED LOCALIZATION (Left) as a surgical intervention.  The patient's history has been reviewed, patient examined, no change in status, stable for surgery.  I have reviewed the patient's chart and labs.  Questions were answered to the patient's satisfaction.     Abigail Miyamoto

## 2022-09-17 NOTE — Addendum Note (Signed)
Addendum  created 09/17/22 1512 by Ronnette Hila, CRNA   Flowsheet accepted

## 2022-09-17 NOTE — Op Note (Signed)
LEFT BREAST LUMPECTOMY WITH RADIOACTIVE SEED LOCALIZATION  Procedure Note  Krystal Edwards 09/17/2022   Pre-op Diagnosis: LEFT BREAST LOBULAR CARCINOMA IN SITU     Post-op Diagnosis: same  Procedure(s): LEFT BREAST LUMPECTOMY WITH RADIOACTIVE SEED LOCALIZATION  Surgeon(s): Abigail Miyamoto, MD  Anesthesia: General  Staff:  Circulator: Raliegh Scarlet, RN Scrub Person: Maryan Rued, RN  Estimated Blood Loss: Minimal               Specimens: sent to path  Indications: This is a 37 year old female was found to have calcifications in her left breast in the upper outer quadrant on screening mammography late last year.  She underwent a biopsy showing lobular carcinoma in situ.  Surgery had to be delayed secondary to health reasons.  She now presents for a radioactive seed guided left breast lumpectomy  Procedure: The patient was brought to the operating room and identified the correct patient.  She was placed upon the operating table and general anesthesia was induced.  Her left breast was prepped and draped in usual sterile fashion.  I identified the radioactive seed in the upper outer quadrant of the left breast with the neoprobe.  I anesthetized the skin overlying the area with Marcaine and then made a longitudinal incision with a scalpel.  I then dissected down into the breast tissue circumferentially with the electrocautery.  With the aid of neoprobe to reach the level of the radioactive seed.  I grasped the area of the clamp and then completed a lumpectomy staying widely around the signal from the radioactive seed.  Once the lumpectomy specimen was removed I marked all margins with paint.  An x-ray was performed on the specimen confirming the seed in the previous biopsy clip in the specimen.  The specimen was then sent to pathology for evaluation.  I achieved hemostasis with the cautery.  I anesthetized the incision further with Marcaine.  I then closed the subcutaneous tissue with  interrupted 3-0 Vicryl sutures and closed the skin with a running 4-0 Monocryl.  Dermabond was then applied.  The patient tolerated the procedure well.  All the counts were correct at the end of the procedure.  The patient was then extubated in the operating room and taken in a stable condition to the recovery room.          Abigail Miyamoto   Date: 09/17/2022  Time: 12:38 PM

## 2022-09-17 NOTE — Discharge Instructions (Addendum)
[facility] Office Phone Number [phone]  BREAST BIOPSY/ PARTIAL MASTECTOMY: POST OP INSTRUCTIONS  Always review your discharge instruction sheet given to you by the facility where your surgery was performed.  IF YOU HAVE DISABILITY OR FAMILY LEAVE FORMS, YOU MUST BRING THEM TO THE OFFICE FOR PROCESSING.  DO NOT GIVE THEM TO YOUR DOCTOR.  A prescription for pain medication may be given to you upon discharge.  Take your pain medication as prescribed, if needed.  If narcotic pain medicine is not needed, then you may take acetaminophen (Tylenol) or ibuprofen (Advil) as needed. Take your usually prescribed medications unless otherwise directed If you need a refill on your pain medication, please contact your pharmacy.  They will contact our office to request authorization.  Prescriptions will not be filled after 5pm or on week-ends. You should eat very light the first 24 hours after surgery, such as soup, crackers, pudding, etc.  Resume your normal diet the day after surgery. Most patients will experience some swelling and bruising in the breast.  Ice packs and a good support bra will help.  Swelling and bruising can take several days to resolve.  It is common to experience some constipation if taking pain medication after surgery.  Increasing fluid intake and taking a stool softener will usually help or prevent this problem from occurring.  A mild laxative (Milk of Magnesia or Miralax) should be taken according to package directions if there are no bowel movements after 48 hours. Unless discharge instructions indicate otherwise, you may remove your bandages 24-48 hours after surgery, and you may shower at that time.  You may have steri-strips (small skin tapes) in place directly over the incision.  These strips should be left on the skin for 7-10 days.  If your surgeon used skin glue on the incision, you may shower in 24 hours.  The glue will flake off over the next 2-3 weeks.  Any  sutures or staples will be removed at the office during your follow-up visit. ACTIVITIES:  You may resume regular daily activities (gradually increasing) beginning the next day.  Wearing a good support bra or sports bra minimizes pain and swelling.  You may have sexual intercourse when it is comfortable. You may drive when you no longer are taking prescription pain medication, you can comfortably wear a seatbelt, and you can safely maneuver your car and apply brakes. RETURN TO WORK:  ______________________________________________________________________________________ Krystal Edwards should see your doctor in the office for a follow-up appointment approximately two weeks after your surgery.  Your doctor's nurse will typically make your follow-up appointment when she calls you with your pathology report.  Expect your pathology report 2-3 business days after your surgery.  You may call to check if you do not hear from [place] after three days. OTHER INSTRUCTIONS: _YOU MAY SHOWER STARTING TOMORROW ICE PACK, TYLENOL, AND IBUPROFEN ALSO FOR PAIN NO VIGOROUS ACTIVITY FOR ONE WEEK ______________________________________________________________________________________________ _____________________________________________________________________________________________________________________________________ _____________________________________________________________________________________________________________________________________ _____________________________________________________________________________________________________________________________________  WHEN TO CALL YOUR DOCTOR: Fever over 101.0 Nausea and/or vomiting. Extreme swelling or bruising. Continued bleeding from incision. Increased pain, redness, or drainage from the incision.  The clinic staff is available to answer your questions during regular business hours.  Please don't hesitate to call and ask to speak to one of the nurses for clinical  concerns.  If you have a medical emergency, go to the nearest emergency room or call 911.  A surgeon from [facility] is always on call at the hospital.  For further questions, please visit  [email or web address]     No Tylenol before 4:45pm if needed. No ibuprofen before 6:30pm if needed.  Post Anesthesia Home Care Instructions  Activity: Get plenty of rest for the remainder of the day. A responsible individual must stay with you for 24 hours following the procedure.  For the next 24 hours, DO NOT: -Drive a car -Advertising copywriter -Drink alcoholic beverages -Take any medication unless instructed by your physician -Make any legal decisions or sign important papers.  Meals: Start with liquid foods such as gelatin or soup. Progress to regular foods as tolerated. Avoid greasy, spicy, heavy foods. If nausea and/or vomiting occur, drink only clear liquids until the nausea and/or vomiting subsides. Call your physician if vomiting continues.  Special Instructions/Symptoms: Your throat may feel dry or sore from the anesthesia or the breathing tube placed in your throat during surgery. If this causes discomfort, gargle with warm salt water. The discomfort should disappear within 24 hours.

## 2022-09-17 NOTE — Transfer of Care (Signed)
Immediate Anesthesia Transfer of Care Note  Patient: Krystal Edwards  Procedure(s) Performed: LEFT BREAST LUMPECTOMY WITH RADIOACTIVE SEED LOCALIZATION (Left: Breast)  Patient Location: PACU  Anesthesia Type:General  Level of Consciousness: awake, alert , drowsy, and patient cooperative  Airway & Oxygen Therapy: Patient Spontanous Breathing and Patient connected to face mask oxygen  Post-op Assessment: Report given to RN and Post -op Vital signs reviewed and stable  Post vital signs: Reviewed and stable  Last Vitals:  Vitals Value Taken Time  BP    Temp    Pulse 112 09/17/22 1250  Resp 27 09/17/22 1250  SpO2 98 % 09/17/22 1250  Vitals shown include unvalidated device data.  Last Pain:  Vitals:   09/17/22 1044  TempSrc: Oral  PainSc: 0-No pain      Patients Stated Pain Goal: 3 (09/17/22 1044)  Complications: No notable events documented.

## 2022-09-17 NOTE — Anesthesia Procedure Notes (Signed)
Procedure Name: Intubation Date/Time: 09/17/2022 12:11 PM  Performed by: Ronnette Hila, CRNAPre-anesthesia Checklist: Patient identified, Emergency Drugs available, Suction available and Patient being monitored Patient Re-evaluated:Patient Re-evaluated prior to induction Oxygen Delivery Method: Circle system utilized Preoxygenation: Pre-oxygenation with 100% oxygen Induction Type: IV induction, Rapid sequence and Cricoid Pressure applied Ventilation: Mask ventilation without difficulty Laryngoscope Size: Mac and 3 Grade View: Grade II Tube type: Oral Tube size: 7.0 mm Number of attempts: 1 Airway Equipment and Method: Stylet and Oral airway Placement Confirmation: ETT inserted through vocal cords under direct vision, positive ETCO2 and breath sounds checked- equal and bilateral Secured at: 22 cm Tube secured with: Tape Dental Injury: Teeth and Oropharynx as per pre-operative assessment

## 2022-09-17 NOTE — Anesthesia Postprocedure Evaluation (Signed)
Anesthesia Post Note  Patient: RYLIEE FIGGE  Procedure(s) Performed: LEFT BREAST LUMPECTOMY WITH RADIOACTIVE SEED LOCALIZATION (Left: Breast)     Patient location during evaluation: PACU Anesthesia Type: General Level of consciousness: awake and alert Pain management: pain level controlled Vital Signs Assessment: post-procedure vital signs reviewed and stable Respiratory status: spontaneous breathing, nonlabored ventilation and respiratory function stable Cardiovascular status: blood pressure returned to baseline Postop Assessment: no apparent nausea or vomiting Anesthetic complications: no   No notable events documented.  Last Vitals:  Vitals:   09/17/22 1250 09/17/22 1300  BP: (!) 154/94 (!) 141/93  Pulse:  (!) 103  Resp:  (!) 22  Temp: 36.5 C   SpO2:  96%    Last Pain:  Vitals:   09/17/22 1315  TempSrc:   PainSc: 0-No pain   Pain Goal: Patients Stated Pain Goal: 3 (09/17/22 1044)                 Shanda Howells

## 2022-09-18 ENCOUNTER — Encounter (HOSPITAL_BASED_OUTPATIENT_CLINIC_OR_DEPARTMENT_OTHER): Payer: Self-pay | Admitting: Surgery

## 2022-09-21 LAB — SURGICAL PATHOLOGY

## 2023-09-10 ENCOUNTER — Other Ambulatory Visit: Payer: Self-pay | Admitting: Family Medicine

## 2023-09-10 DIAGNOSIS — Z0000 Encounter for general adult medical examination without abnormal findings: Secondary | ICD-10-CM

## 2023-09-13 ENCOUNTER — Ambulatory Visit
Admission: RE | Admit: 2023-09-13 | Discharge: 2023-09-13 | Discharge status: Home / Self Care | Source: Ambulatory Visit | Attending: Family Medicine | Admitting: Family Medicine

## 2023-09-13 DIAGNOSIS — Z0000 Encounter for general adult medical examination without abnormal findings: Secondary | ICD-10-CM

## 2023-10-06 ENCOUNTER — Ambulatory Visit: Admitting: Neurology

## 2023-11-09 ENCOUNTER — Encounter (HOSPITAL_COMMUNITY): Payer: Self-pay

## 2023-11-09 ENCOUNTER — Ambulatory Visit (HOSPITAL_COMMUNITY)
Admission: EM | Admit: 2023-11-09 | Discharge: 2023-11-09 | Disposition: A | Discharge status: Home / Self Care | Attending: Family Medicine | Admitting: Family Medicine

## 2023-11-09 DIAGNOSIS — R000 Tachycardia, unspecified: Secondary | ICD-10-CM

## 2023-11-09 DIAGNOSIS — E1165 Type 2 diabetes mellitus with hyperglycemia: Secondary | ICD-10-CM

## 2023-11-09 DIAGNOSIS — R519 Headache, unspecified: Secondary | ICD-10-CM

## 2023-11-09 DIAGNOSIS — Z794 Long term (current) use of insulin: Secondary | ICD-10-CM

## 2023-11-09 DIAGNOSIS — J069 Acute upper respiratory infection, unspecified: Secondary | ICD-10-CM

## 2023-11-09 LAB — POC COVID19/FLU A&B COMBO
Covid Antigen, POC: NEGATIVE
Influenza A Antigen, POC: NEGATIVE
Influenza B Antigen, POC: NEGATIVE

## 2023-11-09 LAB — POCT FASTING CBG KUC MANUAL ENTRY: POCT Glucose (KUC): 156 mg/dL — AB (ref 70–99)

## 2023-11-09 MED ORDER — KETOROLAC TROMETHAMINE 30 MG/ML IJ SOLN
INTRAMUSCULAR | Status: AC
  Filled 2023-11-09: qty 1

## 2023-11-09 MED ORDER — KETOROLAC TROMETHAMINE 60 MG/2ML IM SOLN
60.0000 mg | Freq: Once | INTRAMUSCULAR | Status: AC
  Administered 2023-11-09: 60 mg via INTRAMUSCULAR

## 2023-11-09 MED ORDER — KETOROLAC TROMETHAMINE 60 MG/2ML IM SOLN
INTRAMUSCULAR | Status: AC
  Filled 2023-11-09: qty 2

## 2023-11-09 NOTE — ED Triage Notes (Signed)
 Patient presents to the office for dizziness, headache and fatigue that started 2 days ago. Patient has not taken any medication to help with her symptoms.

## 2023-11-09 NOTE — ED Provider Notes (Signed)
 [facility]   [id number] 11/09/23 Arrival Time: 1107  ASSESSMENT & PLAN:  1. Viral URI with cough   2. Tachycardia   3. Bad headache   4. Type 2 diabetes mellitus with hyperglycemia, with long-term current use of insulin (HCC)    Meds ordered this encounter  Medications   ketorolac  (TORADOL ) injection 60 mg   Discussed typical duration of likely viral illness. Results for orders placed or performed during the hospital encounter of 11/09/23  POC Covid19/Flu A&B Antigen   Collection Time: 11/09/23 11:41 AM  Result Value Ref Range   Influenza A Antigen, POC Negative Negative   Influenza B Antigen, POC Negative Negative   Covid Antigen, POC Negative Negative  POC CBG monitoring   Collection Time: 11/09/23 11:53 AM  Result Value Ref Range   POCT Glucose ([facility]) 156 (A) 70 - 99 mg/dL   OTC symptom care as needed.    Follow-up Information     Ilah Crigler, MD.   Specialty: Family Medicine Why: If worsening or failing to improve as anticipated. Contact information: [address] [place] [address] [phone]                 Reviewed expectations re: course of current medical issues. Questions answered. Outlined signs and symptoms indicating need for more acute intervention. Understanding verbalized. After Visit Summary given.   SUBJECTIVE: History from: Patient. Krystal Edwards is a 38 y.o. female. Patient presents to the office for dizziness, headache and fatigue that started 2 days ago. Patient has not taken any medication to help with her symptoms. Denies: fever. Normal PO intake without n/v/d. HA bothering her the most. Denies visual/hearing changes.  OBJECTIVE:  Vitals:   11/09/23 1124  BP: (!) 146/93  Pulse: (!) 120  Resp: 20  Temp: 99.8 F (37.7 C)  TempSrc: Oral  SpO2: 98%    Recheck P: 108 General appearance: alert; no distress Eyes: PERRLA; EOMI; conjunctiva normal HENT: [place]; AT; with nasal congestion Neck: supple  CV:  tachycardic; regular Lungs: speaks full sentences without difficulty; unlabored Extremities: no edema Skin: warm and dry Neurologic: normal gait Psychological: alert and cooperative; normal mood and affect  Labs: Results for orders placed or performed during the hospital encounter of 11/09/23  POC Covid19/Flu A&B Antigen   Collection Time: 11/09/23 11:41 AM  Result Value Ref Range   Influenza A Antigen, POC Negative Negative   Influenza B Antigen, POC Negative Negative   Covid Antigen, POC Negative Negative  POC CBG monitoring   Collection Time: 11/09/23 11:53 AM  Result Value Ref Range   POCT Glucose ([facility]) 156 (A) 70 - 99 mg/dL   Labs Reviewed  POCT FASTING CBG [facility] MANUAL ENTRY - Abnormal; Notable for the following components:      Result Value   POCT Glucose (K[facility]) 156 (*)    All other components within normal limits  POC COVID19/FLU A&B COMBO    Imaging: No results found.  No Known Allergies  Past Medical History:  Diagnosis Date   Carpal tunnel syndrome 03/2011   BILATERAL RECEIVES INJECTIONS   Choroid plexus cyst of fetus 08/06/2015   Diabetes mellitus without complication (HCC)    Headache    Oligo-ovulation    Pre-eclampsia 12/02/2015   Sleep apnea    Social History   Socioeconomic History   Marital status: Single    Spouse name: n/a   Number of children: 0   Years of education: 14   Highest education level: Not  on file  Occupational History   Occupation: customer service rep    Employer: [employer]  Tobacco Use   Smoking status: Every Day    Current packs/day: 0.50    Average packs/day: 0.5 packs/day for 12.0 years (6.0 ttl pk-yrs)    Types: Cigarettes   Smokeless tobacco: Never  Vaping Use   Vaping status: Never Used  Substance and Sexual Activity   Alcohol use: No    Alcohol/week: 0.0 standard drinks of alcohol   Drug use: No   Sexual activity: Not Currently    Partners: Male    Birth control/protection: None  Other Topics Concern   Not on  file  Social History Narrative   Lives with father and brother. Mother lives with her sister, also in [place].   Social Drivers of Corporate investment banker Strain: Low Risk  (07/01/2023)   Received from [facility]   Overall Financial Resource Strain (CARDIA)    Difficulty of Paying Living Expenses: Not hard at all  Food Insecurity: No Food Insecurity (07/01/2023)   Received from [facility]   Hunger Vital Sign    Within the past 12 months, you worried that your food would run out before you got the money to buy more.: Never true    Within the past 12 months, the food you bought just didn't last and you didn't have money to get more.: Never true  Transportation Needs: No Transportation Needs (07/01/2023)   Received from [facility] - Transportation    Lack of Transportation (Medical): No    Lack of Transportation (Non-Medical): No  Physical Activity: Not on file  Stress: No Stress Concern Present (12/22/2019)   [employer] of Occupational Health - Occupational Stress Questionnaire    Feeling of Stress : Only a little  Social Connections: Moderately Isolated (12/22/2019)   Social Connection and Isolation Panel    Frequency of Communication with Friends and Family: More than three times a week    Frequency of Social Gatherings with Friends and Family: Once a week    Attends Religious Services: More than 4 times per year    Active Member of [employer] or Organizations: No    Attends Banker Meetings: Never    Marital Status: Never married  Intimate Partner Violence: Not At Risk (12/22/2019)   Humiliation, Afraid, Rape, and Kick questionnaire    Fear of Current or Ex-Partner: No    Emotionally Abused: No    Physically Abused: No    Sexually Abused: No   Family History  Problem Relation Age of Onset   Breast cancer Mother 31       2005 AND 2013   Diabetes Father    Diabetes Maternal Grandfather    Breast cancer Paternal Grandmother    Breast  cancer Paternal Grandfather    Past Surgical History:  Procedure Laterality Date   BREAST BIOPSY Left 04/2007   benign   BREAST BIOPSY Left 03/02/2022   US  LT BREAST BX W LOC DEV 1ST LESION IMG BX SPEC US  GUIDE 03/02/2022 [facility] MAMMOGRAPHY   BREAST BIOPSY  09/16/2022   MM LT RADIOACTIVE SEED LOC MAMMO GUIDE 09/16/2022 [facility]MO[facility]   BREAST EXCISIONAL BIOPSY Left 08/2022   LCIS   BREAST LUMPECTOMY WITH RADIOACTIVE SEED LOCALIZATION Left 09/17/2022   Procedure: LEFT BREAST LUMPECTOMY WITH RADIOACTIVE SEED LOCALIZATION;  Surgeon: Vernetta Berg, MD;  Location: [facility];  Service: General;  Laterality: Left;   BREAST SURGERY  2009  left breast bx-benign   HERNIA REPAIR       Rolinda Rogue, MD 11/09/23 1244

## 2023-11-09 NOTE — Discharge Instructions (Signed)
Meds ordered this encounter  Medications   ketorolac (TORADOL) injection 60 mg

## 2024-02-20 ENCOUNTER — Encounter (HOSPITAL_COMMUNITY): Payer: Self-pay

## 2024-02-20 ENCOUNTER — Emergency Department (HOSPITAL_COMMUNITY)
Admission: EM | Admit: 2024-02-20 | Discharge: 2024-02-20 | Disposition: A | Discharge status: Home / Self Care | Attending: Emergency Medicine | Admitting: Emergency Medicine

## 2024-02-20 ENCOUNTER — Other Ambulatory Visit: Payer: Self-pay

## 2024-02-20 DIAGNOSIS — M25512 Pain in left shoulder: Secondary | ICD-10-CM | POA: Insufficient documentation

## 2024-02-20 DIAGNOSIS — Y92410 Unspecified street and highway as the place of occurrence of the external cause: Secondary | ICD-10-CM | POA: Insufficient documentation

## 2024-02-20 DIAGNOSIS — M25551 Pain in right hip: Secondary | ICD-10-CM | POA: Insufficient documentation

## 2024-02-20 DIAGNOSIS — Z7984 Long term (current) use of oral hypoglycemic drugs: Secondary | ICD-10-CM | POA: Diagnosis not present

## 2024-02-20 DIAGNOSIS — R079 Chest pain, unspecified: Secondary | ICD-10-CM | POA: Diagnosis not present

## 2024-02-20 DIAGNOSIS — E119 Type 2 diabetes mellitus without complications: Secondary | ICD-10-CM | POA: Diagnosis not present

## 2024-02-20 DIAGNOSIS — M542 Cervicalgia: Secondary | ICD-10-CM | POA: Insufficient documentation

## 2024-02-20 DIAGNOSIS — M25511 Pain in right shoulder: Secondary | ICD-10-CM | POA: Diagnosis not present

## 2024-02-20 DIAGNOSIS — M545 Low back pain, unspecified: Secondary | ICD-10-CM | POA: Insufficient documentation

## 2024-02-20 DIAGNOSIS — M7918 Myalgia, other site: Secondary | ICD-10-CM

## 2024-02-20 MED ORDER — KETOROLAC TROMETHAMINE 15 MG/ML IJ SOLN
15.0000 mg | Freq: Once | INTRAMUSCULAR | Status: AC
  Administered 2024-02-20: 15 mg via INTRAMUSCULAR
  Filled 2024-02-20: qty 1

## 2024-02-20 MED ORDER — METHOCARBAMOL 500 MG PO TABS: 1000.0000 mg | ORAL_TABLET | Freq: Three times a day (TID) | ORAL | 0 refills | Status: AC | PRN

## 2024-02-20 MED ORDER — OXYCODONE-ACETAMINOPHEN 5-325 MG PO TABS
1.0000 | ORAL_TABLET | ORAL | Status: DC | PRN
  Administered 2024-02-20: 1 via ORAL
  Filled 2024-02-20: qty 1

## 2024-02-20 MED ORDER — NAPROXEN 500 MG PO TABS: 500.0000 mg | ORAL_TABLET | Freq: Two times a day (BID) | ORAL | 0 refills | Status: AC
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
## 2024-05-17 ENCOUNTER — Ambulatory Visit: Payer: Self-pay | Admitting: Family Medicine
